# Patient Record
Sex: Male | Born: 1940 | Race: White | Hispanic: No | Marital: Single | Smoking: Never smoker
Health system: Southern US, Community
[De-identification: ages and names within clinical notes are randomized; demographics above are authoritative.]

## PROBLEM LIST (undated history)

## (undated) DIAGNOSIS — I219 Acute myocardial infarction, unspecified: Secondary | ICD-10-CM

## (undated) DIAGNOSIS — M109 Gout, unspecified: Secondary | ICD-10-CM

## (undated) DIAGNOSIS — K469 Unspecified abdominal hernia without obstruction or gangrene: Secondary | ICD-10-CM

## (undated) DIAGNOSIS — N183 Chronic kidney disease, stage 3 unspecified: Secondary | ICD-10-CM

## (undated) DIAGNOSIS — I5020 Unspecified systolic (congestive) heart failure: Secondary | ICD-10-CM

## (undated) DIAGNOSIS — I255 Ischemic cardiomyopathy: Secondary | ICD-10-CM

## (undated) DIAGNOSIS — I10 Essential (primary) hypertension: Secondary | ICD-10-CM

## (undated) DIAGNOSIS — N281 Cyst of kidney, acquired: Secondary | ICD-10-CM

## (undated) DIAGNOSIS — I480 Paroxysmal atrial fibrillation: Secondary | ICD-10-CM

## (undated) DIAGNOSIS — I2510 Atherosclerotic heart disease of native coronary artery without angina pectoris: Secondary | ICD-10-CM

## (undated) DIAGNOSIS — N400 Enlarged prostate without lower urinary tract symptoms: Secondary | ICD-10-CM

## (undated) DIAGNOSIS — D472 Monoclonal gammopathy: Secondary | ICD-10-CM

## (undated) DIAGNOSIS — E785 Hyperlipidemia, unspecified: Secondary | ICD-10-CM

## (undated) DIAGNOSIS — M1990 Unspecified osteoarthritis, unspecified site: Secondary | ICD-10-CM

## (undated) DIAGNOSIS — M1A00X1 Idiopathic chronic gout, unspecified site, with tophus (tophi): Secondary | ICD-10-CM

## (undated) HISTORY — DX: Gout, unspecified: M10.9

## (undated) HISTORY — PX: VEIN BYPASS SURGERY: SHX833

## (undated) HISTORY — PX: REPLACEMENT TOTAL KNEE: SUR1224

## (undated) HISTORY — DX: Ischemic cardiomyopathy: I25.5

## (undated) HISTORY — PX: CARDIAC CATHETERIZATION: SHX172

## (undated) HISTORY — DX: Unspecified systolic (congestive) heart failure: I50.20

## (undated) HISTORY — DX: Acute myocardial infarction, unspecified: I21.9

## (undated) HISTORY — PX: JOINT REPLACEMENT: SHX530

## (undated) HISTORY — PX: CORONARY ARTERY BYPASS GRAFT: SHX141

## (undated) HISTORY — DX: Paroxysmal atrial fibrillation: I48.0

## (undated) HISTORY — PX: OTHER SURGICAL HISTORY: SHX169

## (undated) HISTORY — DX: Essential (primary) hypertension: I10

## (undated) HISTORY — DX: Unspecified abdominal hernia without obstruction or gangrene: K46.9

## (undated) HISTORY — DX: Unspecified osteoarthritis, unspecified site: M19.90

## (undated) HISTORY — DX: Hyperlipidemia, unspecified: E78.5

---

## 2012-01-30 DIAGNOSIS — Z79899 Other long term (current) drug therapy: Secondary | ICD-10-CM | POA: Insufficient documentation

## 2012-01-30 DIAGNOSIS — M1990 Unspecified osteoarthritis, unspecified site: Secondary | ICD-10-CM | POA: Insufficient documentation

## 2012-01-30 DIAGNOSIS — M1A9XX1 Chronic gout, unspecified, with tophus (tophi): Secondary | ICD-10-CM | POA: Insufficient documentation

## 2012-10-23 DIAGNOSIS — N281 Cyst of kidney, acquired: Secondary | ICD-10-CM | POA: Insufficient documentation

## 2014-12-17 ENCOUNTER — Encounter: Payer: Self-pay | Admitting: Podiatry

## 2014-12-17 ENCOUNTER — Encounter: Payer: Self-pay | Admitting: *Deleted

## 2014-12-17 ENCOUNTER — Ambulatory Visit (INDEPENDENT_AMBULATORY_CARE_PROVIDER_SITE_OTHER): Payer: Medicare Other | Admitting: Podiatry

## 2014-12-17 ENCOUNTER — Other Ambulatory Visit: Payer: Self-pay | Admitting: *Deleted

## 2014-12-17 VITALS — BP 175/88 | HR 60 | Resp 16 | Ht 69.0 in | Wt 239.0 lb

## 2014-12-17 DIAGNOSIS — I2510 Atherosclerotic heart disease of native coronary artery without angina pectoris: Secondary | ICD-10-CM | POA: Insufficient documentation

## 2014-12-17 DIAGNOSIS — L600 Ingrowing nail: Secondary | ICD-10-CM

## 2014-12-17 DIAGNOSIS — N289 Disorder of kidney and ureter, unspecified: Secondary | ICD-10-CM | POA: Insufficient documentation

## 2014-12-17 DIAGNOSIS — N400 Enlarged prostate without lower urinary tract symptoms: Secondary | ICD-10-CM | POA: Insufficient documentation

## 2014-12-17 DIAGNOSIS — I10 Essential (primary) hypertension: Secondary | ICD-10-CM | POA: Insufficient documentation

## 2014-12-17 NOTE — Progress Notes (Signed)
   Subjective:    Patient ID: Jeff Henderson, male    DOB: December 17, 1940, 74 y.o.   MRN: [id number]  HPI Comments: Its the 2nd toenail on my left foot. Its growing over. It does not hurt. Ive had it 3 weeks. Its remained the same. i have pedicures.      Review of Systems  All other systems reviewed and are negative.      Objective:   Physical Exam        Assessment & Plan:

## 2014-12-17 NOTE — Patient Instructions (Signed)

## 2014-12-17 NOTE — Progress Notes (Signed)
Subjective:     Patient ID: Jeff Henderson, male   DOB: 07/18/1941, 74 y.o.   MRN: [id number]  HPI patient presents with a damaged nail second left that's thick dystrophic and impossible for him to cut. States that he's tried to work on it himself and has been to a pedicurist   Review of Systems  All other systems reviewed and are negative.      Objective:   Physical Exam  Constitutional: He is oriented to person, place, and time.  Cardiovascular: Intact distal pulses.   Musculoskeletal: Normal range of motion.  Neurological: He is oriented to person, place, and time.  Skin: Skin is warm.  Nursing note and vitals reviewed.  neurovascular status intact with muscle strength adequate and range of motion subtalar and midtarsal joint within normal limits. Patient is noted to have good digital perfusion is well oriented 3 and has a thick damaged second nail left that he cannot take care of or cut     Assessment:     Damaged second nail left that does have dystrophic changes within it and impossible for him to cut    Plan:     Reviewed condition and recommended removal of the nail. Explained procedure and risk and he wants the surgery and wants permanent removal. Infiltrated 60 mg Xylocaine Marcaine mixture remove the second nail exposed the base and applied chemical phenol for applications 30 seconds followed by alcohol lavaged and sterile dressing. Gave instructions on soaks and reappoint

## 2015-08-01 DIAGNOSIS — M5030 Other cervical disc degeneration, unspecified cervical region: Secondary | ICD-10-CM | POA: Insufficient documentation

## 2015-09-16 ENCOUNTER — Ambulatory Visit (INDEPENDENT_AMBULATORY_CARE_PROVIDER_SITE_OTHER): Payer: Medicare Other | Admitting: Sports Medicine

## 2015-09-16 ENCOUNTER — Ambulatory Visit (INDEPENDENT_AMBULATORY_CARE_PROVIDER_SITE_OTHER): Payer: Medicare Other

## 2015-09-16 ENCOUNTER — Encounter: Payer: Self-pay | Admitting: Sports Medicine

## 2015-09-16 VITALS — BP 161/78 | HR 62 | Resp 16

## 2015-09-16 DIAGNOSIS — L603 Nail dystrophy: Secondary | ICD-10-CM | POA: Diagnosis not present

## 2015-09-16 DIAGNOSIS — B351 Tinea unguium: Secondary | ICD-10-CM

## 2015-09-16 DIAGNOSIS — M79673 Pain in unspecified foot: Secondary | ICD-10-CM

## 2015-09-16 DIAGNOSIS — M10079 Idiopathic gout, unspecified ankle and foot: Secondary | ICD-10-CM

## 2015-09-16 DIAGNOSIS — M19079 Primary osteoarthritis, unspecified ankle and foot: Secondary | ICD-10-CM

## 2015-09-16 DIAGNOSIS — M109 Gout, unspecified: Secondary | ICD-10-CM

## 2015-09-16 MED ORDER — TRIAMCINOLONE ACETONIDE 10 MG/ML IJ SUSP: 10.0000 mg | Freq: Once | INTRAMUSCULAR | Status: DC

## 2015-09-16 MED ORDER — COLCHICINE 0.6 MG PO TABS: 0.6000 mg | ORAL_TABLET | Freq: Two times a day (BID) | ORAL | Status: DC

## 2015-09-16 NOTE — Progress Notes (Signed)
Patient ID: Jeff Henderson, male   DOB: [birth year], 74 y.o.   MRN: [id number] Subjective: Jeff Henderson is a 74 y.o. male patient who presents to office for evaluation of Left>Right foot pain. Patient complains of progressive pain especially over the last few day Left>right foot at the dorsum of the foot on left and at the 5th toe joint on the right. Pain in the right is worse with weightbearing and shoes that directly press on the area. Patient states that the left foot has flared up and became red and tender painful with pressure/shoes. Patient states that the right foot prevents him from exercising. Patient has not tried anything for the pain.  Admits to past gout hx of which he was put on experimental drug for and then stopped last year. Last attack was >1 year ago in her hands.   Patient is a retired Air[employer] pilot. Patient denies any other pedal complaints or related consitutional symptoms at this time.   Patient Active Problem List   Diagnosis Date Noted  . 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 Arteriosclerosis of coronary artery 12/17/2014  . BP (high blood pressure) 12/17/2014  . Impaired renal function 12/17/2014  . Kidney cysts 10/23/2012  . 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 Polypharmacy 01/30/2012  . Arthritis, degenerative 01/30/2012   Current Outpatient Prescriptions on File Prior to Visit  Medication Sig Dispense Refill  . amLODipine-benazepril (LOTREL) 10-20 MG per capsule   0  . aspirin 81 MG tablet Take 81 mg by mouth daily.    Marland Kitchen CIALIS 5 MG tablet   1  . colchicine 0.6 MG tablet Take 0.6 mg by mouth daily.  0  . Multiple Vitamin (MULTIVITAMIN) tablet Take 1 tablet by mouth daily.    . Omega-3 1000 MG CAPS Take by mouth.    . simvastatin (ZOCOR) 10 MG tablet Take 10 mg by mouth daily.     No current facility-administered medications on file prior to visit.   Allergies  Allergen Reactions  . Penicillins Rash     Objective:  General: Alert and oriented x3 in no acute  distress  Dermatology: No open lesions bilateral lower extremities, no webspace macerations, no ecchymosis bilateral, Significant redness, warmth, swelling left dorsal foot and right 5th MTPJ with mild tenderness. + dystrophy of Left 2nd toenail.   Vascular: Dorsalis Pedis and Posterior Tibial pedal pulses 1/4, Capillary Fill Time 3 seconds,(-) pedal hair growth bilateral, Temperature gradient increased.  Neurology: Johney Maine sensation intact via light touch bilateral.  Musculoskeletal: Tenderness with palpation at Left dorsal foot and right 5th MTPJ,No pain with calf compression bilateral. Left 2nd hammertoe deformity. Prominent metatarsal head #5 right greater than left with distal fat pad migration . There is decreased ankle, Subtalar joint, midtarsal joint, MTPJ range of motion secondary to significant arthritis..Strength within normal limits in all groups bilateral.   Gait: Antalgic wide base gait.   Xrays  Right Foot 3 views    Impression: Significant osteophytes and osteolysis at 1st, 5th MTPJ, midfoot, rearfoot and Ankle with old calcific avulsion fragments posterior calceanus. Soft tissues within normal limits       Left Foot 3 views    Impression: Significant osteophytes and osteolysis at 1st, 5th MTPJ, tarsometatarsal and midfoot, rearfoot, and ankle with old calcific avulsion fragments posterior calceanus, 2nd digit subluxed. Soft tissues within normal limits  Assessment and Plan: Problem List Items Addressed This Visit    None    Visit Diagnoses    Foot pain, unspecified laterality    -  Primary    Relevant Orders    DG Foot Complete Left    DG Foot Complete Right    Acute gout of foot, unspecified cause, unspecified laterality        Relevant Medications    cyclobenzaprine (FLEXERIL) 5 MG tablet    aspirin 81 MG tablet    triamcinolone acetonide (KENALOG) 10 MG/ML injection 10 mg    colchicine 0.6 MG tablet    Other Relevant Orders    CBC with Differential    Basic  Metabolic Panel    Sedimentation Rate    C-reactive protein    Uric Acid    ANA    Rheumatoid factor    Osteoarthritis of ankle and foot, unspecified laterality        Relevant Medications    cyclobenzaprine (FLEXERIL) 5 MG tablet    aspirin 81 MG tablet    triamcinolone acetonide (KENALOG) 10 MG/ML injection 10 mg    colchicine 0.6 MG tablet    Nail dystrophy        left 2nd toenail      -Complete examination performed -Xrays reviewed -Discussed treatement options for gout and education dispensed  -After verbal consent injected right 5th MTPJ with 1cc lidocaine, 1cc Marcaine, 0.25 [id number] without complication. Applied offloading pad to right 5th metatarsal head with instruction on use -Rx Colchicine 0.55m  -Ordered Uric Acid, Arthritic panel, CBC, BMP; will call patient if any labs are concerning -Debrided left 2nd toenail using sterile nail nipper and smooth with dremel without incident -Patient to return to office in 3 weeks or sooner if condition worsens.  TLandis Martins DPM

## 2015-09-16 NOTE — Patient Instructions (Signed)

## 2015-09-17 LAB — CBC WITH DIFFERENTIAL/PLATELET
BASOS: 0 %
Basophils Absolute: 0 10*3/uL (ref 0.0–0.2)
EOS (ABSOLUTE): 0.4 10*3/uL (ref 0.0–0.4)
EOS: 5 %
HEMATOCRIT: 40.9 % (ref 37.5–51.0)
Hemoglobin: 13.7 g/dL (ref 12.6–17.7)
IMMATURE GRANS (ABS): 0 10*3/uL (ref 0.0–0.1)
IMMATURE GRANULOCYTES: 0 %
LYMPHS: 20 %
Lymphocytes Absolute: 1.6 10*3/uL (ref 0.7–3.1)
MCH: 31.2 pg (ref 26.6–33.0)
MCHC: 33.5 g/dL (ref 31.5–35.7)
MCV: 93 fL (ref 79–97)
Monocytes Absolute: 0.8 10*3/uL (ref 0.1–0.9)
Monocytes: 10 %
NEUTROS ABS: 5.3 10*3/uL (ref 1.4–7.0)
Neutrophils: 65 %
PLATELETS: 188 10*3/uL (ref 150–379)
RBC: 4.39 x10E6/uL (ref 4.14–5.80)
RDW: 13.4 % (ref 12.3–15.4)
WBC: 8.1 10*3/uL (ref 3.4–10.8)

## 2015-09-17 LAB — BASIC METABOLIC PANEL
BUN / CREAT RATIO: 19 (ref 10–22)
BUN: 28 mg/dL — AB (ref 8–27)
CALCIUM: 9.2 mg/dL (ref 8.6–10.2)
CHLORIDE: 103 mmol/L (ref 97–108)
CO2: 24 mmol/L (ref 18–29)
CREATININE: 1.5 mg/dL — AB (ref 0.76–1.27)
GFR, EST AFRICAN AMERICAN: 52 mL/min/{1.73_m2} — AB (ref >59)
GFR, EST NON AFRICAN AMERICAN: 45 mL/min/{1.73_m2} — AB (ref >59)
Glucose: 83 mg/dL (ref 65–99)
Potassium: 4.7 mmol/L (ref 3.5–5.2)
Sodium: 143 mmol/L (ref 134–144)

## 2015-09-17 LAB — RHEUMATOID FACTOR: Rhuematoid fact SerPl-aCnc: <10 IU/mL (ref 0.0–13.9)

## 2015-09-17 LAB — URIC ACID: URIC ACID: 8.2 mg/dL (ref 3.7–8.6)

## 2015-09-17 LAB — ANA: Anti Nuclear Antibody(ANA): NEGATIVE

## 2015-09-17 LAB — SEDIMENTATION RATE: SED RATE: 4 mm/h (ref 0–30)

## 2015-09-17 LAB — C-REACTIVE PROTEIN: CRP: 11.1 mg/L — AB (ref 0.0–4.9)

## 2015-09-20 ENCOUNTER — Telehealth: Payer: Self-pay | Admitting: Sports Medicine

## 2015-09-20 NOTE — Telephone Encounter (Signed)
Called patient and discussed lab results; Patient symptomatically is doing better; reports swelling and redness improving; Will f/u 11/4; Plan to work with PCP for Rheum Consult for long term gout management.

## 2015-10-07 ENCOUNTER — Ambulatory Visit: Payer: Medicare Other | Admitting: Sports Medicine

## 2015-10-14 ENCOUNTER — Ambulatory Visit (INDEPENDENT_AMBULATORY_CARE_PROVIDER_SITE_OTHER): Payer: Medicare Other | Admitting: Sports Medicine

## 2015-10-14 ENCOUNTER — Encounter: Payer: Self-pay | Admitting: Sports Medicine

## 2015-10-14 DIAGNOSIS — M79673 Pain in unspecified foot: Secondary | ICD-10-CM | POA: Diagnosis not present

## 2015-10-14 DIAGNOSIS — M109 Gout, unspecified: Secondary | ICD-10-CM

## 2015-10-14 DIAGNOSIS — M19079 Primary osteoarthritis, unspecified ankle and foot: Secondary | ICD-10-CM

## 2015-10-14 DIAGNOSIS — M10079 Idiopathic gout, unspecified ankle and foot: Secondary | ICD-10-CM | POA: Diagnosis not present

## 2015-10-14 NOTE — Progress Notes (Signed)
Patient ID: Jeff Henderson, male   DOB: December 10, 1940, 74 y.o.   MRN: [id number]  Subjective: Jeff Henderson is a 74 y.o. male patient who returns to office for evaluation of Left>Right foot pain secondary to acute gouty attack; reports that he took the colchicine with resolution of symptoms and is back at gym now with no pain in his feet. Patient denies any other pedal complaints or related consitutional symptoms at this time.   Patient Active Problem List   Diagnosis Date Noted  . 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 Arteriosclerosis of coronary artery 12/17/2014  . BP (high blood pressure) 12/17/2014  . Impaired renal function 12/17/2014  . Kidney cysts 10/23/2012  . 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 Polypharmacy 01/30/2012  . Arthritis, degenerative 01/30/2012   Current Outpatient Prescriptions on File Prior to Visit  Medication Sig Dispense Refill  . amLODipine-benazepril (LOTREL) 10-20 MG per capsule   0  . aspirin 81 MG tablet     . atorvastatin (LIPITOR) 10 MG tablet   0  . CIALIS 5 MG tablet   1  . colchicine 0.6 MG tablet Take 1 tablet (0.6 mg total) by mouth 2 (two) times daily. 60 tablet 1  . cyclobenzaprine (FLEXERIL) 5 MG tablet Take 5 mg by mouth 3 (three) times daily.  0  . FLUZONE HIGH-DOSE 0.5 ML SUSY inject 0.5 milliliter intramuscularly  0  . Multiple Vitamin (MULTIVITAMIN) tablet Take 1 tablet by mouth daily.    . Multiple Vitamins-Minerals (MULTIVITAMIN ADULTS 50+ PO) Take by mouth.    . Omega-3 1000 MG CAPS Take by mouth.    . simvastatin (ZOCOR) 10 MG tablet Take 10 mg by mouth daily.    . tamsulosin (FLOMAX) 0.4 MG CAPS capsule   1  . VOLTAREN 1 % GEL apply 2 grams to affected area four times a day  0   Current Facility-Administered Medications on File Prior to Visit  Medication Dose Route Frequency Provider Last Rate Last Dose  . triamcinolone acetonide (KENALOG) 10 MG/ML injection 10 mg  10 mg Other Once Landis Martins, DPM       Allergies  Allergen Reactions  .  Penicillins Rash    Objective:  General: Alert and oriented x3 in no acute distress  Dermatology: No open lesions bilateral lower extremities, no webspace macerations, no ecchymosis bilateral, No warmth, swelling or tenderness bilateral. + dystrophy of Left 2nd toenail, asymptomatic.   Vascular: Dorsalis Pedis and Posterior Tibial pedal pulses 1/4, Capillary Fill Time 3 seconds,(-) pedal hair growth bilateral, Temperature gradient increased.  Neurology: Johney Maine sensation intact via light touch bilateral.  Musculoskeletal: No tenderness with palpation at Left dorsal foot and right 5th MTPJ,No pain with calf compression bilateral. Left 2nd hammertoe deformity asymptomatic. Prominent metatarsal head #5 right greater than left with distal fat pad migration . There is decreased ankle, Subtalar joint, midtarsal joint, MTPJ range of motion secondary to significant arthritis.Strength within normal limits in all groups bilateral.    Assessment and Plan: Problem List Items Addressed This Visit    None    Visit Diagnoses    Foot pain, unspecified laterality    -  Primary    resolved    Acute gout of foot, unspecified cause, unspecified laterality        resolved    Osteoarthritis of ankle and foot, unspecified laterality          -Complete examination performed -Discussed mantainance treatment for gout and previous labs as discuss during phone call to  Mr. Duque a few weeks ago. -Recommended patient to follow up with PCP or rheumatologist for long term management.  -Patient to return to office as needed or sooner if condition worsens.  Landis Martins, DPM

## 2015-12-20 DIAGNOSIS — K429 Umbilical hernia without obstruction or gangrene: Secondary | ICD-10-CM | POA: Insufficient documentation

## 2016-02-23 ENCOUNTER — Other Ambulatory Visit: Payer: Self-pay | Admitting: Sports Medicine

## 2016-09-04 ENCOUNTER — Other Ambulatory Visit: Payer: Self-pay | Admitting: Otolaryngology

## 2016-09-04 DIAGNOSIS — H9042 Sensorineural hearing loss, unilateral, left ear, with unrestricted hearing on the contralateral side: Secondary | ICD-10-CM

## 2016-09-18 ENCOUNTER — Ambulatory Visit
Admission: RE | Admit: 2016-09-18 | Discharge: 2016-09-18 | Disposition: A | Discharge status: Home / Self Care | Payer: Medicare Other | Source: Ambulatory Visit | Attending: Otolaryngology | Admitting: Otolaryngology

## 2016-09-18 ENCOUNTER — Other Ambulatory Visit
Admission: RE | Admit: 2016-09-18 | Discharge: 2016-09-18 | Disposition: A | Discharge status: Home / Self Care | Payer: Medicare Other | Source: Ambulatory Visit | Attending: Otolaryngology | Admitting: Otolaryngology

## 2016-09-18 DIAGNOSIS — Z8673 Personal history of transient ischemic attack (TIA), and cerebral infarction without residual deficits: Secondary | ICD-10-CM | POA: Insufficient documentation

## 2016-09-18 DIAGNOSIS — Z01812 Encounter for preprocedural laboratory examination: Secondary | ICD-10-CM | POA: Insufficient documentation

## 2016-09-18 DIAGNOSIS — H9042 Sensorineural hearing loss, unilateral, left ear, with unrestricted hearing on the contralateral side: Secondary | ICD-10-CM

## 2016-09-18 LAB — CREATININE, SERUM
CREATININE: 1.75 mg/dL — AB (ref 0.61–1.24)
GFR calc non Af Amer: 36 mL/min — ABNORMAL LOW (ref >60)
GFR, EST AFRICAN AMERICAN: 42 mL/min — AB (ref >60)

## 2016-09-18 MED ORDER — GADOBENATE DIMEGLUMINE 529 MG/ML IV SOLN
10.0000 mL | Freq: Once | INTRAVENOUS | Status: AC | PRN
  Administered 2016-09-18: 10 mL via INTRAVENOUS

## 2017-09-12 ENCOUNTER — Other Ambulatory Visit: Payer: Self-pay | Admitting: Family Medicine

## 2017-09-12 DIAGNOSIS — N183 Chronic kidney disease, stage 3 unspecified: Secondary | ICD-10-CM

## 2017-09-12 DIAGNOSIS — R7989 Other specified abnormal findings of blood chemistry: Secondary | ICD-10-CM

## 2017-09-16 ENCOUNTER — Ambulatory Visit
Admission: RE | Admit: 2017-09-16 | Discharge: 2017-09-16 | Disposition: A | Discharge status: Home / Self Care | Payer: Medicare Other | Source: Ambulatory Visit | Attending: Family Medicine | Admitting: Family Medicine

## 2017-09-16 DIAGNOSIS — N183 Chronic kidney disease, stage 3 unspecified: Secondary | ICD-10-CM

## 2017-09-16 DIAGNOSIS — R7989 Other specified abnormal findings of blood chemistry: Secondary | ICD-10-CM | POA: Insufficient documentation

## 2017-09-30 ENCOUNTER — Encounter: Payer: Self-pay | Admitting: Oncology

## 2017-09-30 ENCOUNTER — Inpatient Hospital Stay: Payer: Medicare Other | Attending: Oncology | Admitting: Oncology

## 2017-09-30 ENCOUNTER — Inpatient Hospital Stay: Payer: Medicare Other

## 2017-09-30 DIAGNOSIS — I129 Hypertensive chronic kidney disease with stage 1 through stage 4 chronic kidney disease, or unspecified chronic kidney disease: Secondary | ICD-10-CM | POA: Insufficient documentation

## 2017-09-30 DIAGNOSIS — R768 Other specified abnormal immunological findings in serum: Secondary | ICD-10-CM

## 2017-09-30 DIAGNOSIS — Z7982 Long term (current) use of aspirin: Secondary | ICD-10-CM | POA: Diagnosis not present

## 2017-09-30 DIAGNOSIS — M1990 Unspecified osteoarthritis, unspecified site: Secondary | ICD-10-CM | POA: Insufficient documentation

## 2017-09-30 DIAGNOSIS — N281 Cyst of kidney, acquired: Secondary | ICD-10-CM | POA: Insufficient documentation

## 2017-09-30 DIAGNOSIS — N183 Chronic kidney disease, stage 3 (moderate): Secondary | ICD-10-CM

## 2017-09-30 DIAGNOSIS — Z79899 Other long term (current) drug therapy: Secondary | ICD-10-CM | POA: Diagnosis not present

## 2017-09-30 DIAGNOSIS — I2510 Atherosclerotic heart disease of native coronary artery without angina pectoris: Secondary | ICD-10-CM | POA: Insufficient documentation

## 2017-09-30 DIAGNOSIS — M109 Gout, unspecified: Secondary | ICD-10-CM | POA: Insufficient documentation

## 2017-09-30 DIAGNOSIS — N400 Enlarged prostate without lower urinary tract symptoms: Secondary | ICD-10-CM | POA: Insufficient documentation

## 2017-09-30 LAB — COMPREHENSIVE METABOLIC PANEL
ALK PHOS: 50 U/L (ref 38–126)
ALT: 27 U/L (ref 17–63)
ANION GAP: 9 (ref 5–15)
AST: 27 U/L (ref 15–41)
Albumin: 4.1 g/dL (ref 3.5–5.0)
BILIRUBIN TOTAL: 0.8 mg/dL (ref 0.3–1.2)
BUN: 37 mg/dL — ABNORMAL HIGH (ref 6–20)
CALCIUM: 9.2 mg/dL (ref 8.9–10.3)
CO2: 24 mmol/L (ref 22–32)
CREATININE: 1.69 mg/dL — AB (ref 0.61–1.24)
Chloride: 105 mmol/L (ref 101–111)
GFR, EST AFRICAN AMERICAN: 43 mL/min — AB (ref >60)
GFR, EST NON AFRICAN AMERICAN: 37 mL/min — AB (ref >60)
Glucose, Bld: 93 mg/dL (ref 65–99)
Potassium: 4.1 mmol/L (ref 3.5–5.1)
SODIUM: 138 mmol/L (ref 135–145)
TOTAL PROTEIN: 7.5 g/dL (ref 6.5–8.1)

## 2017-09-30 LAB — CBC WITH DIFFERENTIAL/PLATELET
Basophils Absolute: 0.1 10*3/uL (ref 0–0.1)
Basophils Relative: 1 %
EOS ABS: 0.3 10*3/uL (ref 0–0.7)
Eosinophils Relative: 4 %
HCT: 41.5 % (ref 40.0–52.0)
HEMOGLOBIN: 13.9 g/dL (ref 13.0–18.0)
LYMPHS ABS: 1 10*3/uL (ref 1.0–3.6)
LYMPHS PCT: 14 %
MCH: 31 pg (ref 26.0–34.0)
MCHC: 33.6 g/dL (ref 32.0–36.0)
MCV: 92.3 fL (ref 80.0–100.0)
MONOS PCT: 7 %
Monocytes Absolute: 0.5 10*3/uL (ref 0.2–1.0)
NEUTROS PCT: 74 %
Neutro Abs: 5.4 10*3/uL (ref 1.4–6.5)
Platelets: 193 10*3/uL (ref 150–440)
RBC: 4.5 MIL/uL (ref 4.40–5.90)
RDW: 13.4 % (ref 11.5–14.5)
WBC: 7.3 10*3/uL (ref 3.8–10.6)

## 2017-09-30 NOTE — Progress Notes (Signed)
Hematology/Oncology Consult note [facility] [phone] [phone]  Patient Care Team: System, Provider Not In as PCP - General   Name of the patient: Jeff Henderson  [id number]  July 26, 1941    Reason for referral- elevated immunoglobulin   Referring physician- Dr. Mcneil Sober   Date of visit: 09/30/17   History of presenting illness- Patient is a 76 year old Caucasian male with past medical occlusion of the artery disease, BPH stage III chronic kidney disease other medical problems.  He was evaluated by Dr.Tuchman from [facility] for elevated kappa light chain.  At that time it was thought that the elevated kappa light chain possibly as a result of chronic kidney disease rather than the cause.  Last visit with Dr. Amalia Hailey was in July 2014.  Patient also had a fat pad biopsy in 2014 did not reveal any evidence of malignancy or amyloidosis.  Patient had a bone marrow biopsy which revealed a normocellular bone 40% with trilineage hematopoiesis.  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  Skeletal survey back then did not reveal any lytic lesions.  Patient was also seen by Dr. Dewaine Oats from nephrology because of chronic kidney disease was thought to be due to NSAIDS.  Patient's most recent blood work was as follows: Serum Light Chains Were Elevated at 3.89 with a Free Light Chain Ratio Was Normal at 1.61.  Serum immunofixation revealed no monoclonal protein.  SPEP revealed asymmetric peak in the gamma region.  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  Monoclonal protein was not detected.  Recent CBC from 09/03/2017 revealed a white count of 7.5, H&H of 14/42.5 and a platelet count of 202.  BUN/creatinine was 30/1.8 has remained stable  Patient served for 4 years in the [employer] and continues to be fit and active.  Denies any pain today. No unintentional weight loss   ECOG PS- 0  Pain scale- 0   Review of systems- Review of Systems  Constitutional: Negative for chills, fever, malaise/fatigue and weight loss.  HENT: Negative for congestion, ear discharge and nosebleeds.   Eyes: Negative for blurred vision.  Respiratory: Negative for cough, hemoptysis, sputum production, shortness of breath and wheezing.   Cardiovascular: Negative for chest pain, palpitations, orthopnea and claudication.  Gastrointestinal: Negative for abdominal pain, blood in stool, constipation, diarrhea, heartburn, melena, nausea and vomiting.  Genitourinary: Negative for dysuria, flank pain, frequency, hematuria and urgency.  Musculoskeletal: Negative for back pain, joint pain and myalgias.  Skin: Negative for rash.  Neurological: Negative for dizziness, tingling, focal weakness, seizures, weakness and headaches.  Endo/Heme/Allergies: Does not bruise/bleed easily.  Psychiatric/Behavioral: Negative for depression and suicidal ideas. The patient does not have insomnia.     Allergies  Allergen Reactions  . Penicillins Rash    Patient Active Problem List   Diagnosis Date Noted  . 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 Arteriosclerosis of coronary artery 12/17/2014  . BP (high blood pressure) 12/17/2014  . Impaired renal function 12/17/2014  . Kidney cysts 10/23/2012  . 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 Polypharmacy 01/30/2012  . Arthritis, degenerative 01/30/2012     No past medical history on file.   No past surgical history on file.  Social History   Social History  . Marital status: Unknown    Spouse name: N/A  . Number of children: N/A  . Years of education: N/A   Occupational History  .  Not on file.   Social History Main Topics  . Smoking status: Never Smoker  . Smokeless tobacco: Not on file  . Alcohol use No  . Drug use: Unknown  . Sexual activity: Not on file   Other Topics Concern  . Not on file     Social History Narrative  . No narrative on file     No family history on file.   Current Outpatient Prescriptions:  .  amLODipine-benazepril (LOTREL) 10-20 MG per capsule, , Disp: , Rfl: 0 .  aspirin 81 MG tablet, , Disp: , Rfl:  .  atorvastatin (LIPITOR) 10 MG tablet, , Disp: , Rfl: 0 .  CIALIS 5 MG tablet, , Disp: , Rfl: 1 .  colchicine 0.6 MG tablet, Take 1 tablet (0.6 mg total) by mouth 2 (two) times daily., Disp: 60 tablet, Rfl: 1 .  cyclobenzaprine (FLEXERIL) 5 MG tablet, Take 5 mg by mouth 3 (three) times daily., Disp: , Rfl: 0 .  FLUZONE HIGH-DOSE 0.5 ML SUSY, inject 0.5 milliliter intramuscularly, Disp: , Rfl: 0 .  Multiple Vitamin (MULTIVITAMIN) tablet, Take 1 tablet by mouth daily., Disp: , Rfl:  .  Multiple Vitamins-Minerals (MULTIVITAMIN ADULTS 50+ PO), Take by mouth., Disp: , Rfl:  .  Omega-3 1000 MG CAPS, Take by mouth., Disp: , Rfl:  .  simvastatin (ZOCOR) 10 MG tablet, Take 10 mg by mouth daily., Disp: , Rfl:  .  tamsulosin (FLOMAX) 0.4 MG CAPS capsule, , Disp: , Rfl: 1 .  VOLTAREN 1 % GEL, apply 2 grams to affected area four times a day, Disp: , Rfl: 0  Current Facility-Administered Medications:  .  triamcinolone acetonide (KENALOG) 10 MG/ML injection 10 mg, 10 mg, Other, Once, Landis Martins, DPM   Physical exam:  Vitals:   09/30/17 0951 09/30/17 1017  BP: (!) 149/70   Pulse: 66   Resp:  16  Temp: (!) 97.5 F (36.4 C)   TempSrc: Tympanic   Weight: 250 lb 14.1 oz (113.8 kg)    Physical Exam  Constitutional: He is oriented to person, place, and time.  Obese. Appears in no acute distress  HENT:  Head: Normocephalic and atraumatic.  Eyes: Pupils are equal, round, and reactive to light. EOM are normal.  Neck: Normal range of motion.  Cardiovascular: Normal rate, regular rhythm and normal heart sounds.   Pulmonary/Chest: Effort normal and breath sounds normal.  Abdominal: Soft. Bowel sounds are normal.  Neurological: He is alert and oriented to  person, place, and time.  Skin: Skin is warm and dry.       CMP Latest Ref Rng & Units 09/18/2016  Glucose 65 - 99 mg/dL -  BUN 8 - 27 mg/dL -  Creatinine 0.61 - 1.24 mg/dL 1.75(H)  Sodium 134 - 144 mmol/L -  Potassium 3.5 - 5.2 mmol/L -  Chloride 97 - 108 mmol/L -  CO2 18 - 29 mmol/L -  Calcium 8.6 - 10.2 mg/dL -   CBC Latest Ref Rng & Units 09/16/2015  WBC 3.4 - 10.8 x10E3/uL 8.1  Hemoglobin 12.6 - 17.7 g/dL 13.7  Hematocrit 37.5 - 51.0 % 40.9  Platelets 150 - 379 x10E3/uL 188    No images are attached to the encounter.  US Renal  Result Date: 09/16/2017 CLINICAL DATA:  Elevated serum creatinine, chronic kidney disease. EXAM: RENAL / URINARY TRACT ULTRASOUND COMPLETE COMPARISON:  None. FINDINGS: Right Kidney: Length: 1.3 cm. Parenchymal echogenicity is slightly increased. There are multiple anechoic or nearly anechoic lesions with increased  through transmission, some of which contain a septation and/or calcification, measuring up to 2.4 x 2.4 x 1.5 cm. A calcification is seen in the medullary region. No hydronephrosis. Left Kidney: Length: 11.5 cm. Parenchymal echogenicity is slightly increased. Anechoic or nearly anechoic lesions with increased through transmission measure up to 2.3 x 1.7 x 1.6 cm. The largest contains an internal septation. Calcifications are seen in the renal parenchyma/medullary regions. Bladder: Appears normal for degree of bladder distention. IMPRESSION: 1. Slightly increased parenchymal echogenicity is in keeping with chronic medical renal disease. 2. Scattered cysts, simple to mildly complex in nature. Electronically Signed   By: Lorin Picket M.D.   On: 09/16/2017 10:12    Assessment and plan- Patient is a 76 y.o. male referred for h/o elevated kappa light chains in serum in the past and h/o CKD  I have reviewed prior work up for MGUS including fat pad biopsy and bone marrow biopsy that pateint had in 2014. There was no evidence of monclonal protein on  IFE in serum and urine IFE. Over the years patient has had a grdaually increasing level of kappa light chains as well as lambda light chains with a preserved kappa lambda light chain ratio. I suspect this is due to his CKD and not because of hematological disorder like MGUS/ multiple myeloma or amyloidosis  Today I will obtain cbc with diff, cmp, myeloma panel, serum free light chains and 24 hour urine protein electrophoresis and immunofixation and 24 hour urine protein. I will see him back in 2 weeks time to discuss his results.   Thank you for this kind referral and the opportunity to participate in the care of this patient   Visit Diagnosis 1. Elevated serum immunoglobulin free light chain level     Dr. Randa Evens, MD, MPH [place] [facility] Pager- [phone] 09/30/2017  11:11 AM

## 2017-09-30 NOTE — Progress Notes (Signed)
New consult for abnormal protein in the blood. Patient states that he feels well today and denies having any pain. He has already received his Flu Vaccine.

## 2017-10-01 LAB — MULTIPLE MYELOMA PANEL, SERUM
ALBUMIN SERPL ELPH-MCNC: 3.8 g/dL (ref 2.9–4.4)
ALPHA 1: 0.2 g/dL (ref 0.0–0.4)
Albumin/Glob SerPl: 1.2 (ref 0.7–1.7)
Alpha2 Glob SerPl Elph-Mcnc: 1 g/dL (ref 0.4–1.0)
B-Globulin SerPl Elph-Mcnc: 1.2 g/dL (ref 0.7–1.3)
GAMMA GLOB SERPL ELPH-MCNC: 1.1 g/dL (ref 0.4–1.8)
Globulin, Total: 3.4 g/dL (ref 2.2–3.9)
IGM (IMMUNOGLOBULIN M), SRM: 132 mg/dL (ref 15–143)
IgA: 337 mg/dL (ref 61–437)
IgG (Immunoglobin G), Serum: 945 mg/dL (ref 700–1600)
Total Protein ELP: 7.2 g/dL (ref 6.0–8.5)

## 2017-10-01 LAB — KAPPA/LAMBDA LIGHT CHAINS
KAPPA FREE LGHT CHN: 41.7 mg/L — AB (ref 3.3–19.4)
KAPPA, LAMDA LIGHT CHAIN RATIO: 1.45 (ref 0.26–1.65)
Lambda free light chains: 28.8 mg/L — ABNORMAL HIGH (ref 5.7–26.3)

## 2017-10-11 DIAGNOSIS — E782 Mixed hyperlipidemia: Secondary | ICD-10-CM | POA: Insufficient documentation

## 2017-10-14 ENCOUNTER — Encounter: Payer: Self-pay | Admitting: Oncology

## 2017-10-14 ENCOUNTER — Inpatient Hospital Stay: Payer: Medicare Other

## 2017-10-14 ENCOUNTER — Inpatient Hospital Stay: Payer: Medicare Other | Attending: Oncology | Admitting: Oncology

## 2017-10-14 VITALS — BP 151/80 | HR 62 | Temp 97.4°F | Resp 18 | Ht 69.0 in | Wt 253.1 lb

## 2017-10-14 DIAGNOSIS — Z880 Allergy status to penicillin: Secondary | ICD-10-CM | POA: Diagnosis not present

## 2017-10-14 DIAGNOSIS — Z7982 Long term (current) use of aspirin: Secondary | ICD-10-CM | POA: Insufficient documentation

## 2017-10-14 DIAGNOSIS — M1990 Unspecified osteoarthritis, unspecified site: Secondary | ICD-10-CM | POA: Insufficient documentation

## 2017-10-14 DIAGNOSIS — M109 Gout, unspecified: Secondary | ICD-10-CM | POA: Insufficient documentation

## 2017-10-14 DIAGNOSIS — R768 Other specified abnormal immunological findings in serum: Secondary | ICD-10-CM

## 2017-10-14 DIAGNOSIS — Z79899 Other long term (current) drug therapy: Secondary | ICD-10-CM | POA: Insufficient documentation

## 2017-10-14 DIAGNOSIS — I2510 Atherosclerotic heart disease of native coronary artery without angina pectoris: Secondary | ICD-10-CM | POA: Diagnosis not present

## 2017-10-14 DIAGNOSIS — R809 Proteinuria, unspecified: Secondary | ICD-10-CM | POA: Insufficient documentation

## 2017-10-14 DIAGNOSIS — I129 Hypertensive chronic kidney disease with stage 1 through stage 4 chronic kidney disease, or unspecified chronic kidney disease: Secondary | ICD-10-CM | POA: Diagnosis not present

## 2017-10-14 DIAGNOSIS — N183 Chronic kidney disease, stage 3 (moderate): Secondary | ICD-10-CM | POA: Insufficient documentation

## 2017-10-14 DIAGNOSIS — N401 Enlarged prostate with lower urinary tract symptoms: Secondary | ICD-10-CM | POA: Diagnosis not present

## 2017-10-14 NOTE — Progress Notes (Signed)
Hematology/Oncology Consult note [facility]  Telephone:([phone] [phone]  Patient Care Team: [name], MD as PCP - General (Family Medicine)   Name of the patient: Jeff Henderson  [id number]  29-Apr-1941   Date of visit: 10/14/17  Diagnosis- elevated kappa light chains likely due to CKD  Chief complaint/ Reason for visit- discuss results of blood work  Heme/Onc history: Patient is a 76 year old Caucasian male with past medical occlusion of the artery disease, BPH stage III chronic kidney disease other medical problems.  He was evaluated by Dr.Tuchman from [facility] for elevated kappa light chain.  At that time it was thought that the elevated kappa light chain possibly as a result of chronic kidney disease rather than the cause.  Last visit with Dr. Amalia Hailey was in July 2014.  Patient also had a fat pad biopsy in 2014 did not reveal any evidence of malignancy or amyloidosis.  Patient had a bone marrow biopsy which revealed a normocellular bone 40% with trilineage hematopoiesis.  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  Skeletal survey back then did not reveal any lytic lesions.  Patient was also seen by Dr. Dewaine Oats from nephrology because of chronic kidney disease was thought to be due to NSAIDS.  Patient's most recent blood work was as follows: Serum Light Chains Were Elevated at 3.89 with a Free Light Chain Ratio Was Normal at 1.61.  Serum immunofixation revealed no monoclonal protein.  SPEP revealed asymmetric peak in the gamma region.  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  Monoclonal protein was not detected.  Recent CBC from 09/03/2017 revealed a white count of 7.5, H&H of 14/42.5 and a platelet count of 202.  BUN/creatinine was 30/1.8 has remained stable  Patient served for 4 years in the  [employer] and continues to be fit and active. Denies any pain today. No unintentional weight loss  Results of blood work from 09/30/2017 were as follows: CBC showed white count of 7.3, H&H of 13.9/41.5 with a platelet count of 193.  CMP was significant for elevated BUN and creatinine of 37/1.69 respectively which is chronic and stable.  Multiple myeloma panel did not reveal any monoclonal protein on SPEP or immunofixation.  Serum free light chains revealed elevated Of 41.7 and elevated lambda of 28.8 with a normal ratio of 1.45    Interval history- doing well. Denies nay complaints  ECOG PS- 0 Pain scale- 0   Review of systems- Review of Systems  Constitutional: Negative for chills, fever, malaise/fatigue and weight loss.  HENT: Negative for congestion, ear discharge and nosebleeds.   Eyes: Negative for blurred vision.  Respiratory: Negative for cough, hemoptysis, sputum production, shortness of breath and wheezing.   Cardiovascular: Negative for chest pain, palpitations, orthopnea and claudication.  Gastrointestinal: Negative for abdominal pain, blood in stool, constipation, diarrhea, heartburn, melena, nausea and vomiting.  Genitourinary: Negative for dysuria, flank pain, frequency, hematuria and urgency.  Musculoskeletal: Negative for back pain, joint pain and myalgias.  Skin: Negative for rash.  Neurological: Negative for dizziness, tingling, focal weakness, seizures, weakness and headaches.  Endo/Heme/Allergies: Does not bruise/bleed easily.  Psychiatric/Behavioral: Negative for depression and suicidal ideas. The patient does not have insomnia.       Allergies  Allergen Reactions  . Penicillins Rash     Past Medical History:  Diagnosis Date  . Arthritis   . Gout   .  Hernia, abdominal   . Hypertension      Past Surgical History:  Procedure Laterality Date  . carpel tunnel    . REPLACEMENT TOTAL KNEE    . VEIN BYPASS SURGERY      Social History   Socioeconomic  History  . Marital status: Unknown    Spouse name: Not on file  . Number of children: Not on file  . Years of education: Not on file  . Highest education level: Not on file  Social Needs  . Financial resource strain: Not on file  . Food insecurity - worry: Not on file  . Food insecurity - inability: Not on file  . Transportation needs - medical: Not on file  . Transportation needs - non-medical: Not on file  Occupational History  . Not on file  Tobacco Use  . Smoking status: Never Smoker  . Smokeless tobacco: Never Used  Substance and Sexual Activity  . Alcohol use: No    Alcohol/week: 0.0 oz  . Drug use: No  . Sexual activity: Yes  Other Topics Concern  . Not on file  Social History Narrative  . Not on file    History reviewed. No pertinent family history.   Current Outpatient Medications:  .  amLODipine-benazepril (LOTREL) 10-20 MG per capsule, Take 1 capsule daily by mouth. , Disp: , Rfl: 0 .  aspirin 81 MG tablet, Take 81 mg daily by mouth. , Disp: , Rfl:  .  CIALIS 5 MG tablet, Take 5 mg daily as needed by mouth. , Disp: , Rfl: 1 .  Multiple Vitamins-Minerals (MULTIVITAMIN ADULTS 50+ PO), Take 1 tablet daily by mouth. , Disp: , Rfl:  .  Omega-3 1000 MG CAPS, Take 1 capsule daily by mouth. , Disp: , Rfl:   Current Facility-Administered Medications:  .  triamcinolone acetonide (KENALOG) 10 MG/ML injection 10 mg, 10 mg, Other, Once, Landis Martins, DPM  Physical exam:  Vitals:   10/14/17 1016  BP: (!) 151/80  Pulse: 62  Resp: 18  Temp: (!) 97.4 F (36.3 C)  TempSrc: Tympanic  Weight: 253 lb 1.4 oz (114.8 kg)  Height: 5' 9" (1.753 m)   Physical Exam  Constitutional: He is oriented to person, place, and time and well-developed, well-nourished, and in no distress.  HENT:  Head: Normocephalic and atraumatic.  Eyes: EOM are normal. Pupils are equal, round, and reactive to light.  Neck: Normal range of motion.  Cardiovascular: Normal rate, regular rhythm and  normal heart sounds.  Pulmonary/Chest: Effort normal and breath sounds normal.  Abdominal: Soft. Bowel sounds are normal.  Neurological: He is alert and oriented to person, place, and time.  Skin: Skin is warm and dry.     CMP Latest Ref Rng & Units 09/30/2017  Glucose 65 - 99 mg/dL 93  BUN 6 - 20 mg/dL 37(H)  Creatinine 0.61 - 1.24 mg/dL 1.69(H)  Sodium 135 - 145 mmol/L 138  Potassium 3.5 - 5.1 mmol/L 4.1  Chloride 101 - 111 mmol/L 105  CO2 22 - 32 mmol/L 24  Calcium 8.9 - 10.3 mg/dL 9.2  Total Protein 6.5 - 8.1 g/dL 7.5  Total Bilirubin 0.3 - 1.2 mg/dL 0.8  Alkaline Phos 38 - 126 U/L 50  AST 15 - 41 U/L 27  ALT 17 - 63 U/L 27   CBC Latest Ref Rng & Units 09/30/2017  WBC 3.8 - 10.6 K/uL 7.3  Hemoglobin 13.0 - 18.0 g/dL 13.9  Hematocrit 40.0 - 52.0 % 41.5  Platelets 150 -  440 K/uL 193    No images are attached to the encounter.  US Renal  Result Date: 09/16/2017 CLINICAL DATA:  Elevated serum creatinine, chronic kidney disease. EXAM: RENAL / URINARY TRACT ULTRASOUND COMPLETE COMPARISON:  None. FINDINGS: Right Kidney: Length: 1.3 cm. Parenchymal echogenicity is slightly increased. There are multiple anechoic or nearly anechoic lesions with increased through transmission, some of which contain a septation and/or calcification, measuring up to 2.4 x 2.4 x 1.5 cm. A calcification is seen in the medullary region. No hydronephrosis. Left Kidney: Length: 11.5 cm. Parenchymal echogenicity is slightly increased. Anechoic or nearly anechoic lesions with increased through transmission measure up to 2.3 x 1.7 x 1.6 cm. The largest contains an internal septation. Calcifications are seen in the renal parenchyma/medullary regions. Bladder: Appears normal for degree of bladder distention. IMPRESSION: 1. Slightly increased parenchymal echogenicity is in keeping with chronic medical renal disease. 2. Scattered cysts, simple to mildly complex in nature. Electronically Signed   By: Lorin Picket M.D.    On: 09/16/2017 10:12     Assessment and plan- Patient is a 76 y.o. male referred for elevated kappa and lambda light chains  I discussed the results of the blood work with the patient in detail.  Patient does not have any evidence of anemia hypercalcemia.  He does have chronic kidney disease secondary to hypertension which is stable.  Multiple myeloma panel did not reveal any monoclonal protein on SPEP and immunofixation.  Elevated kappa and lambda free light chains are likely due to his chronic kidney disease as light chain excretion is impaired in chronic kidney disease.  I do not feel that these light chains are the cause of his CKD.  Also the free light chain ratio is normal.  Based on these findings patient does not have any evidence of MGUS or overt multiple myeloma at this time.  This can be monitored conservatively without any further intervention.  I will see the patient back in 6 months time with CBC, CMP, serum free light chains and multiple myeloma panel done 1 week prior   Visit Diagnosis 1. Elevated serum immunoglobulin free light chains      Dr. Randa Evens, MD, MPH [facility] at [facility] Pager- [phone] 10/14/2017 10:18 AM

## 2017-10-14 NOTE — Progress Notes (Signed)
Here to get lab results

## 2017-10-16 LAB — IFE+PROTEIN ELECTRO, 24-HR UR
% BETA, URINE: 8.5 %
ALPHA 1 URINE: 1.7 %
ALPHA 2 UR: 3.2 %
Albumin, U: 80.1 %
GAMMA GLOBULIN URINE: 6.5 %
TOTAL PROTEIN, URINE-UR/DAY: 1752 mg/(24.h) — AB (ref 30–150)
TOTAL VOLUME: 2000
Total Protein, Urine: 87.6 mg/dL

## 2018-04-07 ENCOUNTER — Inpatient Hospital Stay: Payer: Medicare Other

## 2018-04-08 ENCOUNTER — Inpatient Hospital Stay: Payer: Medicare Other | Attending: Oncology

## 2018-04-08 DIAGNOSIS — Z880 Allergy status to penicillin: Secondary | ICD-10-CM | POA: Insufficient documentation

## 2018-04-08 DIAGNOSIS — I2510 Atherosclerotic heart disease of native coronary artery without angina pectoris: Secondary | ICD-10-CM | POA: Diagnosis not present

## 2018-04-08 DIAGNOSIS — M109 Gout, unspecified: Secondary | ICD-10-CM | POA: Insufficient documentation

## 2018-04-08 DIAGNOSIS — Z79899 Other long term (current) drug therapy: Secondary | ICD-10-CM | POA: Diagnosis not present

## 2018-04-08 DIAGNOSIS — I129 Hypertensive chronic kidney disease with stage 1 through stage 4 chronic kidney disease, or unspecified chronic kidney disease: Secondary | ICD-10-CM | POA: Insufficient documentation

## 2018-04-08 DIAGNOSIS — R768 Other specified abnormal immunological findings in serum: Secondary | ICD-10-CM | POA: Insufficient documentation

## 2018-04-08 DIAGNOSIS — N183 Chronic kidney disease, stage 3 (moderate): Secondary | ICD-10-CM | POA: Insufficient documentation

## 2018-04-08 DIAGNOSIS — M1990 Unspecified osteoarthritis, unspecified site: Secondary | ICD-10-CM | POA: Diagnosis not present

## 2018-04-08 DIAGNOSIS — N401 Enlarged prostate with lower urinary tract symptoms: Secondary | ICD-10-CM | POA: Diagnosis not present

## 2018-04-08 DIAGNOSIS — R809 Proteinuria, unspecified: Secondary | ICD-10-CM | POA: Diagnosis not present

## 2018-04-08 DIAGNOSIS — Z7982 Long term (current) use of aspirin: Secondary | ICD-10-CM | POA: Insufficient documentation

## 2018-04-08 LAB — COMPREHENSIVE METABOLIC PANEL
ALBUMIN: 4.3 g/dL (ref 3.5–5.0)
ALK PHOS: 45 U/L (ref 38–126)
ALT: 28 U/L (ref 17–63)
AST: 29 U/L (ref 15–41)
Anion gap: 8 (ref 5–15)
BUN: 34 mg/dL — AB (ref 6–20)
CALCIUM: 9.1 mg/dL (ref 8.9–10.3)
CHLORIDE: 109 mmol/L (ref 101–111)
CO2: 23 mmol/L (ref 22–32)
Creatinine, Ser: 1.84 mg/dL — ABNORMAL HIGH (ref 0.61–1.24)
GFR calc Af Amer: 39 mL/min — ABNORMAL LOW (ref >60)
GFR calc non Af Amer: 33 mL/min — ABNORMAL LOW (ref >60)
GLUCOSE: 91 mg/dL (ref 65–99)
Potassium: 4.6 mmol/L (ref 3.5–5.1)
SODIUM: 140 mmol/L (ref 135–145)
TOTAL PROTEIN: 7.7 g/dL (ref 6.5–8.1)
Total Bilirubin: 0.5 mg/dL (ref 0.3–1.2)

## 2018-04-08 LAB — CBC WITH DIFFERENTIAL/PLATELET
Basophils Absolute: 0.1 10*3/uL (ref 0–0.1)
Basophils Relative: 1 %
Eosinophils Absolute: 0.4 10*3/uL (ref 0–0.7)
Eosinophils Relative: 6 %
HCT: 40.5 % (ref 40.0–52.0)
Hemoglobin: 13.7 g/dL (ref 13.0–18.0)
Lymphocytes Relative: 18 %
Lymphs Abs: 1.2 10*3/uL (ref 1.0–3.6)
MCH: 31.3 pg (ref 26.0–34.0)
MCHC: 33.8 g/dL (ref 32.0–36.0)
MCV: 92.6 fL (ref 80.0–100.0)
MONO ABS: 0.6 10*3/uL (ref 0.2–1.0)
MONOS PCT: 9 %
Neutro Abs: 4.2 10*3/uL (ref 1.4–6.5)
Neutrophils Relative %: 66 %
Platelets: 171 10*3/uL (ref 150–440)
RBC: 4.37 MIL/uL — ABNORMAL LOW (ref 4.40–5.90)
RDW: 13.6 % (ref 11.5–14.5)
WBC: 6.4 10*3/uL (ref 3.8–10.6)

## 2018-04-09 LAB — KAPPA/LAMBDA LIGHT CHAINS
KAPPA FREE LGHT CHN: 46.6 mg/L — AB (ref 3.3–19.4)
KAPPA, LAMDA LIGHT CHAIN RATIO: 2.09 — AB (ref 0.26–1.65)
Lambda free light chains: 22.3 mg/L (ref 5.7–26.3)

## 2018-04-10 LAB — MULTIPLE MYELOMA PANEL, SERUM
ALBUMIN SERPL ELPH-MCNC: 3.9 g/dL (ref 2.9–4.4)
Albumin/Glob SerPl: 1.3 (ref 0.7–1.7)
Alpha 1: 0.3 g/dL (ref 0.0–0.4)
Alpha2 Glob SerPl Elph-Mcnc: 0.9 g/dL (ref 0.4–1.0)
B-GLOBULIN SERPL ELPH-MCNC: 1.1 g/dL (ref 0.7–1.3)
Gamma Glob SerPl Elph-Mcnc: 1 g/dL (ref 0.4–1.8)
Globulin, Total: 3.2 g/dL (ref 2.2–3.9)
IGA: 334 mg/dL (ref 61–437)
IgG (Immunoglobin G), Serum: 892 mg/dL (ref 700–1600)
IgM (Immunoglobulin M), Srm: 101 mg/dL (ref 15–143)
TOTAL PROTEIN ELP: 7.1 g/dL (ref 6.0–8.5)

## 2018-04-14 ENCOUNTER — Ambulatory Visit: Payer: Medicare Other | Admitting: Oncology

## 2018-04-15 ENCOUNTER — Encounter: Payer: Self-pay | Admitting: Oncology

## 2018-04-15 ENCOUNTER — Inpatient Hospital Stay (HOSPITAL_BASED_OUTPATIENT_CLINIC_OR_DEPARTMENT_OTHER): Payer: Medicare Other | Admitting: Oncology

## 2018-04-15 VITALS — BP 165/81 | HR 71 | Temp 97.2°F | Resp 18 | Ht 69.0 in | Wt 249.9 lb

## 2018-04-15 DIAGNOSIS — R768 Other specified abnormal immunological findings in serum: Secondary | ICD-10-CM

## 2018-04-15 DIAGNOSIS — I129 Hypertensive chronic kidney disease with stage 1 through stage 4 chronic kidney disease, or unspecified chronic kidney disease: Secondary | ICD-10-CM | POA: Diagnosis not present

## 2018-04-15 DIAGNOSIS — Z79899 Other long term (current) drug therapy: Secondary | ICD-10-CM

## 2018-04-15 DIAGNOSIS — R809 Proteinuria, unspecified: Secondary | ICD-10-CM

## 2018-04-15 DIAGNOSIS — N183 Chronic kidney disease, stage 3 (moderate): Secondary | ICD-10-CM | POA: Diagnosis not present

## 2018-04-15 NOTE — Progress Notes (Signed)
No new changes noted today 

## 2018-04-15 NOTE — Progress Notes (Signed)
Hematology/Oncology Consult note [facility]  Telephone:([phone] [phone]  Patient Care Team: [name], MD as PCP - General (Family Medicine)   Name of the patient: Jeff Henderson  [id number]  05-Jan-1941   Date of visit: 04/15/18  Diagnosis- elevated free light chains due to CKD  Chief complaint/ Reason for visit- routine f/u of elevated serum free light chains  Heme/Onc history: Patientis a 77 year old Caucasian male with past medical occlusion of the artery disease, BPH stage III chronic kidney disease other medical problems. He was evaluated by [facility] for elevated kappa light chain. At that time it was thought that the elevated kappa light chain possibly as a result of chronic kidney disease rather than the cause.Last visit with Dr. Amalia Hailey was inJuly 2014.Patient also had a fat pad biopsy in 2014 did not reveal any evidence of malignancy or amyloidosis.Patient had a bone marrow biopsy which revealed a normocellular bone 40% with trilineage hematopoiesis. 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 Skeletal survey back then did not reveal any lytic lesions. Patient was also seen by Dr. Dewaine Oats from nephrology because of chronic kidney disease was thought to be due to NSAIDS.  Patient's most recent blood work was as follows: Serum Light Chains Were Elevated at 3.89 with a Free Light Chain Ratio Was Normal at 1.61.Serum immunofixation revealed no monoclonal protein. SPEP revealed asymmetric peakin the gamma region. 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 Monoclonal protein was not detected.Recent CBC from 09/03/2017 revealed a white count of 7.5, H&H of 14/42.5 and a platelet count of 202.BUN/creatinine was 30/1.8 has remained stable  Patient served for 4 years  in the [employer] and continues to be fit and active. Denies any pain today. No unintentional weight loss  Results of blood work from 09/30/2017 were as follows: CBC showed white count of 7.3, H&H of 13.9/41.5 with a platelet count of 193.  CMP was significant for elevated BUN and creatinine of 37/1.69 respectively which is chronic and stable.  Multiple myeloma panel did not reveal any monoclonal protein on SPEP or immunofixation.  Serum free light chains revealed elevated Of 41.7 and elevated lambda of 28.8 with a normal ratio of 1.45  Interval history-he remains active.  Denies any fatigue or unintentional weight loss.  Denies any aches or pains anywhere.  ECOG PS- 1 Pain scale- 0   Review of systems- Review of Systems  Constitutional: Negative for chills, fever, malaise/fatigue and weight loss.  HENT: Negative for congestion, ear discharge and nosebleeds.   Eyes: Negative for blurred vision.  Respiratory: Negative for cough, hemoptysis, sputum production, shortness of breath and wheezing.   Cardiovascular: Negative for chest pain, palpitations, orthopnea and claudication.  Gastrointestinal: Negative for abdominal pain, blood in stool, constipation, diarrhea, heartburn, melena, nausea and vomiting.  Genitourinary: Negative for dysuria, flank pain, frequency, hematuria and urgency.  Musculoskeletal: Negative for back pain, joint pain and myalgias.  Skin: Negative for rash.  Neurological: Negative for dizziness, tingling, focal weakness, seizures, weakness and headaches.  Endo/Heme/Allergies: Does not bruise/bleed easily.  Psychiatric/Behavioral: Negative for depression and suicidal ideas. The patient does not have insomnia.     Allergies  Allergen Reactions  . Penicillins Rash     Past Medical History:  Diagnosis Date  . Arthritis   . Gout   . Hernia, abdominal   . Hypertension      Past Surgical History:  Procedure Laterality Date  . carpel tunnel    . REPLACEMENT TOTAL  KNEE    . VEIN BYPASS SURGERY      Social History   Socioeconomic History  . Marital status: Unknown    Spouse name: Not on file  . Number of children: Not on file  . Years of education: Not on file  . Highest education level: Not on file  Occupational History  . Not on file  Social Needs  . Financial resource strain: Not on file  . Food insecurity:    Worry: Not on file    Inability: Not on file  . Transportation needs:    Medical: Not on file    Non-medical: Not on file  Tobacco Use  . Smoking status: Never Smoker  . Smokeless tobacco: Never Used  Substance and Sexual Activity  . Alcohol use: No    Alcohol/week: 0.0 oz  . Drug use: No  . Sexual activity: Yes  Lifestyle  . Physical activity:    Days per week: Not on file    Minutes per session: Not on file  . Stress: Not on file  Relationships  . Social connections:    Talks on phone: Not on file    Gets together: Not on file    Attends religious service: Not on file    Active member of club or organization: Not on file    Attends meetings of clubs or organizations: Not on file    Relationship status: Not on file  . Intimate partner violence:    Fear of current or ex partner: Not on file    Emotionally abused: Not on file    Physically abused: Not on file    Forced sexual activity: Not on file  Other Topics Concern  . Not on file  Social History Narrative  . Not on file    History reviewed. No pertinent family history.   Current Outpatient Medications:  .  amLODipine-benazepril (LOTREL) 10-20 MG per capsule, Take 1 capsule daily by mouth. , Disp: , Rfl: 0 .  aspirin 81 MG tablet, Take 81 mg daily by mouth. , Disp: , Rfl:  .  Multiple Vitamins-Minerals (MULTIVITAMIN ADULTS 50+ PO), Take 1 tablet daily by mouth. , Disp: , Rfl:  .  Omega-3 1000 MG CAPS, Take 1 capsule daily by mouth. , Disp: , Rfl:  .  CIALIS 5 MG tablet, Take 5 mg daily as needed by mouth. , Disp: , Rfl: 1  Current Facility-Administered  Medications:  .  triamcinolone acetonide (KENALOG) 10 MG/ML injection 10 mg, 10 mg, Other, Once, Landis Martins, DPM  Physical exam:  Vitals:   04/15/18 1011  BP: (!) 165/81  Pulse: 71  Resp: 18  Temp: (!) 97.2 F (36.2 C)  TempSrc: Tympanic  SpO2: 97%  Weight: 249 lb 14.4 oz (113.4 kg)  Height: 5' 9"  (1.753 m)   Physical Exam  Constitutional: He is oriented to person, place, and time. He appears well-developed and well-nourished.  HENT:  Head: Normocephalic and atraumatic.  Eyes: Pupils are equal, round, and reactive to light. EOM are normal.  Neck: Normal range of motion.  Cardiovascular: Normal rate, regular rhythm and normal heart sounds.  Pulmonary/Chest: Effort normal and breath sounds normal.  Abdominal: Soft. Bowel sounds are normal.  Neurological: He is alert and oriented to person, place, and time.  Skin: Skin is warm and dry.     CMP Latest Ref Rng & Units 04/08/2018  Glucose 65 - 99  mg/dL 91  BUN 6 - 20 mg/dL 34(H)  Creatinine 0.61 - 1.24 mg/dL 1.84(H)  Sodium 135 - 145 mmol/L 140  Potassium 3.5 - 5.1 mmol/L 4.6  Chloride 101 - 111 mmol/L 109  CO2 22 - 32 mmol/L 23  Calcium 8.9 - 10.3 mg/dL 9.1  Total Protein 6.5 - 8.1 g/dL 7.7  Total Bilirubin 0.3 - 1.2 mg/dL 0.5  Alkaline Phos 38 - 126 U/L 45  AST 15 - 41 U/L 29  ALT 17 - 63 U/L 28   CBC Latest Ref Rng & Units 04/08/2018  WBC 3.8 - 10.6 K/uL 6.4  Hemoglobin 13.0 - 18.0 g/dL 13.7  Hematocrit 40.0 - 52.0 % 40.5  Platelets 150 - 440 K/uL 171      Assessment and plan- Patient is a 77 y.o. male with elevated serum free light chains likely secondary to CKD   Labs done a week ago were reviewed. His hemoglobin is stable around 13.  White count and platelet count are normal.  His creatinine is mildly elevated at 1.8 today as compared to 1.696 months ago.  Myeloma panel still does not reveal any evidence of monoclonal protein kappa lambda ratio was normal 6 months ago and is mildly elevated at 2.09 today.   Patient has had a bone marrow biopsy in the past and currently I still do not see any evidence of overt multiple myeloma.  Continue to monitor.  Repeat CBC, CMP, myeloma panel and serum free light chains in 3 months in 6 months and I will see him back in 6 months    Visit Diagnosis 1. Elevated serum immunoglobulin free light chains      Dr. Randa Evens, MD, MPH [facility] at [facility] [phone] 04/15/2018 12:50 PM

## 2018-04-30 ENCOUNTER — Other Ambulatory Visit: Payer: Self-pay | Admitting: Student

## 2018-04-30 DIAGNOSIS — M5414 Radiculopathy, thoracic region: Secondary | ICD-10-CM

## 2018-04-30 DIAGNOSIS — M542 Cervicalgia: Secondary | ICD-10-CM

## 2018-05-12 ENCOUNTER — Ambulatory Visit
Admission: RE | Admit: 2018-05-12 | Discharge: 2018-05-12 | Disposition: A | Discharge status: Home / Self Care | Payer: Medicare Other | Source: Ambulatory Visit | Attending: Student | Admitting: Student

## 2018-05-12 ENCOUNTER — Ambulatory Visit: Payer: Medicare Other

## 2018-05-12 DIAGNOSIS — M47895 Other spondylosis, thoracolumbar region: Secondary | ICD-10-CM | POA: Diagnosis not present

## 2018-05-12 DIAGNOSIS — M47894 Other spondylosis, thoracic region: Secondary | ICD-10-CM | POA: Insufficient documentation

## 2018-05-12 DIAGNOSIS — M542 Cervicalgia: Secondary | ICD-10-CM | POA: Diagnosis present

## 2018-05-12 DIAGNOSIS — M4682 Other specified inflammatory spondylopathies, cervical region: Secondary | ICD-10-CM | POA: Insufficient documentation

## 2018-05-12 DIAGNOSIS — M4802 Spinal stenosis, cervical region: Secondary | ICD-10-CM | POA: Insufficient documentation

## 2018-05-12 DIAGNOSIS — M2578 Osteophyte, vertebrae: Secondary | ICD-10-CM | POA: Insufficient documentation

## 2018-05-12 DIAGNOSIS — M47892 Other spondylosis, cervical region: Secondary | ICD-10-CM | POA: Diagnosis not present

## 2018-05-12 DIAGNOSIS — M5414 Radiculopathy, thoracic region: Secondary | ICD-10-CM | POA: Diagnosis not present

## 2018-05-19 DIAGNOSIS — M720 Palmar fascial fibromatosis [Dupuytren]: Secondary | ICD-10-CM | POA: Insufficient documentation

## 2018-05-27 ENCOUNTER — Encounter: Payer: Self-pay | Admitting: Podiatry

## 2018-05-27 ENCOUNTER — Ambulatory Visit (INDEPENDENT_AMBULATORY_CARE_PROVIDER_SITE_OTHER): Payer: Medicare Other | Admitting: Podiatry

## 2018-05-27 DIAGNOSIS — M1A9XX1 Chronic gout, unspecified, with tophus (tophi): Secondary | ICD-10-CM

## 2018-05-27 DIAGNOSIS — L02612 Cutaneous abscess of left foot: Secondary | ICD-10-CM | POA: Diagnosis not present

## 2018-05-27 MED ORDER — DOXYCYCLINE HYCLATE 100 MG PO TABS: 100.0000 mg | ORAL_TABLET | Freq: Two times a day (BID) | ORAL | 0 refills | Status: DC

## 2018-05-27 MED ORDER — GENTAMICIN SULFATE 0.1 % EX CREA: 1.0000 "application " | TOPICAL_CREAM | Freq: Three times a day (TID) | CUTANEOUS | 1 refills | Status: DC

## 2018-05-30 LAB — WOUND CULTURE: Organism ID, Bacteria: NONE SEEN

## 2018-05-31 ENCOUNTER — Encounter: Payer: Self-pay | Admitting: Internal Medicine

## 2018-05-31 ENCOUNTER — Inpatient Hospital Stay
Admission: EM | Admit: 2018-05-31 | Discharge: 2018-06-03 | DRG: 281 | Disposition: A | Discharge status: Home / Self Care | Payer: Medicare Other | Attending: Specialist | Admitting: Specialist

## 2018-05-31 ENCOUNTER — Emergency Department: Payer: Medicare Other

## 2018-05-31 ENCOUNTER — Other Ambulatory Visit: Payer: Self-pay

## 2018-05-31 DIAGNOSIS — N183 Chronic kidney disease, stage 3 unspecified: Secondary | ICD-10-CM | POA: Diagnosis present

## 2018-05-31 DIAGNOSIS — R079 Chest pain, unspecified: Secondary | ICD-10-CM | POA: Diagnosis present

## 2018-05-31 DIAGNOSIS — Z96659 Presence of unspecified artificial knee joint: Secondary | ICD-10-CM | POA: Diagnosis present

## 2018-05-31 DIAGNOSIS — Z7982 Long term (current) use of aspirin: Secondary | ICD-10-CM

## 2018-05-31 DIAGNOSIS — I2510 Atherosclerotic heart disease of native coronary artery without angina pectoris: Secondary | ICD-10-CM | POA: Diagnosis present

## 2018-05-31 DIAGNOSIS — E785 Hyperlipidemia, unspecified: Secondary | ICD-10-CM | POA: Diagnosis present

## 2018-05-31 DIAGNOSIS — I214 Non-ST elevation (NSTEMI) myocardial infarction: Secondary | ICD-10-CM | POA: Diagnosis not present

## 2018-05-31 DIAGNOSIS — I25810 Atherosclerosis of coronary artery bypass graft(s) without angina pectoris: Secondary | ICD-10-CM | POA: Diagnosis present

## 2018-05-31 DIAGNOSIS — Z79899 Other long term (current) drug therapy: Secondary | ICD-10-CM

## 2018-05-31 DIAGNOSIS — Z8249 Family history of ischemic heart disease and other diseases of the circulatory system: Secondary | ICD-10-CM

## 2018-05-31 DIAGNOSIS — I10 Essential (primary) hypertension: Secondary | ICD-10-CM | POA: Diagnosis present

## 2018-05-31 DIAGNOSIS — I129 Hypertensive chronic kidney disease with stage 1 through stage 4 chronic kidney disease, or unspecified chronic kidney disease: Secondary | ICD-10-CM | POA: Diagnosis present

## 2018-05-31 DIAGNOSIS — Z880 Allergy status to penicillin: Secondary | ICD-10-CM

## 2018-05-31 HISTORY — DX: Atherosclerotic heart disease of native coronary artery without angina pectoris: I25.10

## 2018-05-31 HISTORY — DX: Chronic kidney disease, stage 3 unspecified: N18.30

## 2018-05-31 HISTORY — DX: Chronic kidney disease, stage 3 (moderate): N18.3

## 2018-05-31 LAB — CBC
HEMATOCRIT: 40.2 % (ref 40.0–52.0)
HEMOGLOBIN: 13.7 g/dL (ref 13.0–18.0)
MCH: 31.8 pg (ref 26.0–34.0)
MCHC: 34.1 g/dL (ref 32.0–36.0)
MCV: 93.4 fL (ref 80.0–100.0)
Platelets: 170 10*3/uL (ref 150–440)
RBC: 4.31 MIL/uL — AB (ref 4.40–5.90)
RDW: 13.1 % (ref 11.5–14.5)
WBC: 6.7 10*3/uL (ref 3.8–10.6)

## 2018-05-31 LAB — BASIC METABOLIC PANEL
Anion gap: 10 (ref 5–15)
BUN: 38 mg/dL — ABNORMAL HIGH (ref 8–23)
CO2: 20 mmol/L — AB (ref 22–32)
Calcium: 9.1 mg/dL (ref 8.9–10.3)
Chloride: 107 mmol/L (ref 98–111)
Creatinine, Ser: 1.51 mg/dL — ABNORMAL HIGH (ref 0.61–1.24)
GFR calc non Af Amer: 42 mL/min — ABNORMAL LOW (ref >60)
GFR, EST AFRICAN AMERICAN: 49 mL/min — AB (ref >60)
Glucose, Bld: 192 mg/dL — ABNORMAL HIGH (ref 70–99)
POTASSIUM: 4.7 mmol/L (ref 3.5–5.1)
SODIUM: 137 mmol/L (ref 135–145)

## 2018-05-31 LAB — PROTIME-INR
INR: 1.04
PROTHROMBIN TIME: 13.5 s (ref 11.4–15.2)

## 2018-05-31 LAB — APTT: aPTT: 34 seconds (ref 24–36)

## 2018-05-31 LAB — TROPONIN I: Troponin I: 0.03 ng/mL (ref <0.03)

## 2018-05-31 MED ORDER — ASPIRIN 81 MG PO CHEW
324.0000 mg | CHEWABLE_TABLET | Freq: Once | ORAL | Status: AC
  Administered 2018-05-31: 324 mg via ORAL
  Filled 2018-05-31: qty 4

## 2018-05-31 MED ORDER — HEPARIN (PORCINE) IN NACL 100-0.45 UNIT/ML-% IJ SOLN
1100.0000 [IU]/h | INTRAMUSCULAR | Status: DC
  Administered 2018-05-31 – 2018-06-02 (×3): 1100 [IU]/h via INTRAVENOUS
  Filled 2018-05-31 (×3): qty 250

## 2018-05-31 MED ORDER — SODIUM CHLORIDE 0.9 % IV BOLUS
500.0000 mL | Freq: Once | INTRAVENOUS | Status: AC
  Administered 2018-05-31: 500 mL via INTRAVENOUS

## 2018-05-31 MED ORDER — HEPARIN BOLUS VIA INFUSION
4000.0000 [IU] | Freq: Once | INTRAVENOUS | Status: AC
  Administered 2018-05-31: 4000 [IU] via INTRAVENOUS
  Filled 2018-05-31: qty 4000

## 2018-05-31 MED ORDER — NITROGLYCERIN 0.4 MG SL SUBL
0.4000 mg | SUBLINGUAL_TABLET | SUBLINGUAL | Status: DC | PRN
  Administered 2018-05-31 (×2): 0.4 mg via SUBLINGUAL
  Filled 2018-05-31: qty 1

## 2018-05-31 NOTE — ED Triage Notes (Signed)
Patient reports symptoms for approximately 1 hour.  Reports mid sternal chest pain with shortness of breath, and radiating down left arm.

## 2018-05-31 NOTE — Consult Note (Signed)
ANTICOAGULATION CONSULT NOTE - Initial Consult  Pharmacy Consult for Heparin Drip  Indication: chest pain/ACS  Allergies  Allergen Reactions  . Penicillins Rash   Patient Measurements: Height: 5' 8"  (172.7 cm) Weight: 249 lb (112.9 kg) IBW/kg (Calculated) : 68.4 Heparin Dosing Weight: 90kg  Vital Signs: Temp: 97.7 F (36.5 C) (06/29 2038) Temp Source: Oral (06/29 2038) BP: 169/91 (06/29 2114) Pulse Rate: 81 (06/29 2114)  Labs: Recent Labs    05/31/18 2103  HGB 13.7  HCT 40.2  PLT 170    CrCl cannot be calculated (Patient's most recent lab result is older than the maximum 21 days allowed.).  Medical History: Past Medical History:  Diagnosis Date  . Arthritis   . Gout   . Hernia, abdominal   . Hypertension    Assessment: Pharmacy consulted for heparin drip dosing and monitoring for 77yo male admitted with ACS.  Goal of Therapy:  Heparin level 0.3-0.7 units/ml Monitor platelets by anticoagulation protocol: Yes   Plan:  Give 4000 units bolus x 1 Start heparin infusion at 1100 units/hr Check anti-Xa level in 8 hours and daily while on heparin Continue to monitor H&H and platelets  Pernell Dupre, PharmD, BCPS Clinical Pharmacist 05/31/2018 9:25 PM

## 2018-05-31 NOTE — ED Notes (Signed)
Pt reports 0.4 SL Nitro tablet given by Dr Alfred Levins at 930 pm.

## 2018-05-31 NOTE — ED Provider Notes (Signed)
[facility] Emergency Department Provider Note  ____________________________________________  Time seen: Approximately 9:13 PM  I have reviewed the triage vital signs and the nursing notes.   HISTORY  Chief Complaint Chest Pain   HPI Jeff Henderson is a 77 y.o. male for history of CAD status post CABG, chronic kidney disease, hypertension, hyperlipidemia who presents for evaluation of chest pain.  Patient has been having exertional chest pain for a month.  Today started having chest pain an hour ago at rest.  Describes the pain as pressure located in the center of his chest radiating down his left arm and associated with shortness of breath.  No dizziness, no nausea or vomiting.  Patient does not take nitro.  Is not on Plavix.  Pain at this time is 7 out of 10.  Patient denies personal or family history of blood clots, recent travel immobilization, leg pain or swelling, hemoptysis, or exogenous hormones. No paresthesias of his extremities, no radiation to the back.   Past Medical History:  Diagnosis Date  . Arthritis   . Gout   . Hernia, abdominal   . Hypertension     Patient Active Problem List   Diagnosis Date Noted  . 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 Arteriosclerosis of coronary artery 12/17/2014  . BP (high blood pressure) 12/17/2014  . Impaired renal function 12/17/2014  . Kidney cysts 10/23/2012  . 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 Polypharmacy 01/30/2012  . Arthritis, degenerative 01/30/2012    Past Surgical History:  Procedure Laterality Date  . carpel tunnel    . REPLACEMENT TOTAL KNEE    . VEIN BYPASS SURGERY      Prior to Admission medications   Medication Sig Start Date End Date Taking? Authorizing Provider  amLODipine-benazepril (LOTREL) 10-20 MG per capsule Take 1 capsule daily by mouth.  10/19/14   [provider]  aspirin 81 MG tablet Take 81 mg daily by mouth.  04/20/10   [provider]  carvedilol (COREG)  3.125 MG tablet  05/22/18   [provider]  celecoxib (CELEBREX) 100 MG capsule  04/25/18   [provider]  CIALIS 5 MG tablet Take 5 mg daily as needed by mouth.  11/12/14   [provider]  doxycycline (VIBRA-TABS) 100 MG tablet Take 1 tablet (100 mg total) by mouth 2 (two) times daily. 05/27/18   Edrick Kins, DPM  gentamicin cream (GARAMYCIN) 0.1 % Apply 1 application topically 3 (three) times daily. 05/27/18   Edrick Kins, DPM  Multiple Vitamins-Minerals (MULTIVITAMIN ADULTS 50+ PO) Take 1 tablet daily by mouth.  04/20/10   [provider]  Omega-3 1000 MG CAPS Take 1 capsule daily by mouth.  01/17/11   [provider]  omega-3 acid ethyl esters (LOVAZA) 1 g capsule Take by mouth. 01/17/11   [provider]    Allergies Penicillins  No family history on file.  Social History Social History   Tobacco Use  . Smoking status: Never Smoker  . Smokeless tobacco: Never Used  Substance Use Topics  . Alcohol use: No    Alcohol/week: 0.0 oz  . Drug use: No    Review of Systems  Constitutional: Negative for fever. Eyes: Negative for visual changes. ENT: Negative for sore throat. Neck: No neck pain  Cardiovascular: +chest pain. Respiratory: +shortness of breath. Gastrointestinal: Negative for abdominal pain, vomiting or diarrhea. Genitourinary: Negative for dysuria. Musculoskeletal: Negative for back pain. Skin: Negative for rash. Neurological: Negative for headaches, weakness  or numbness. Psych: No SI or HI  ____________________________________________   PHYSICAL EXAM:  VITAL SIGNS: ED Triage Vitals  Enc Vitals Group     BP 05/31/18 2038 (!) 189/87     Pulse Rate 05/31/18 2038 87     Resp 05/31/18 2038 18     Temp 05/31/18 2038 97.7 F (36.5 C)     Temp Source 05/31/18 2038 Oral     SpO2 05/31/18 2038 97 %     Weight 05/31/18 2037 249 lb (112.9 kg)     Height 05/31/18 2037 5' 8"  (1.727 m)     Head Circumference  --      Peak Flow --      Pain Score 05/31/18 2036 7     Pain Loc --      Pain Edu? --      Excl. in [place]? --     Constitutional: Alert and oriented. Well appearing and in no apparent distress. HEENT:      Head: Normocephalic and atraumatic.         Eyes: Conjunctivae are normal. Sclera is non-icteric.       Mouth/Throat: Mucous membranes are moist.       Neck: Supple with no signs of meningismus. Cardiovascular: Regular rate and rhythm. No murmurs, gallops, or rubs. 2+ symmetrical distal pulses are present in all extremities. No JVD. Respiratory: Normal respiratory effort. Lungs are clear to auscultation bilaterally. No wheezes, crackles, or rhonchi.  Gastrointestinal: Soft, non tender, and non distended with positive bowel sounds. No rebound or guarding. Musculoskeletal: Nontender with normal range of motion in all extremities. No edema, cyanosis, or erythema of extremities. Neurologic: Normal speech and language. Face is symmetric. Moving all extremities. No gross focal neurologic deficits are appreciated. Skin: Skin is warm, dry and intact. No rash noted. Psychiatric: Mood and affect are normal. Speech and behavior are normal.  ____________________________________________   LABS (all labs ordered are listed, but only abnormal results are displayed)  Labs Reviewed  BASIC METABOLIC PANEL - Abnormal; Notable for the following components:      Result Value   CO2 20 (*)    Glucose, Bld 192 (*)    BUN 38 (*)    Creatinine, Ser 1.51 (*)    GFR calc non Af Amer 42 (*)    GFR calc Af Amer 49 (*)    All other components within normal limits  CBC - Abnormal; Notable for the following components:   RBC 4.31 (*)    All other components within normal limits  TROPONIN I - Abnormal; Notable for the following components:   Troponin I 0.03 (*)    All other components within normal limits  APTT  PROTIME-INR  HEPARIN LEVEL (UNFRACTIONATED)  CBC    ____________________________________________  EKG  ED ECG REPORT I, Rudene Re, the attending physician, personally viewed and interpreted this ECG.  Normal sinus rhythm, rate of 87, normal intervals, normal axis, less than 1 mm ST elevation in inferior leads with T wave inversions in 1 and aVL.  No prior for comparison  20:50 -normal sinus rhythm, rate of 87, normal intervals, normal axis, persistent ST elevation in lead III which is less than 1 mm and T wave inversions in 1 and aVL.  Unchanged from initial one. ____________________________________________  RADIOLOGY  I have personally reviewed the images performed during this visit and I agree with the Radiologist's read.   Interpretation by Radiologist:  Dg Chest Portable 1 View  Result Date: 05/31/2018 CLINICAL DATA:  Chest pain and shortness of breath. EXAM: PORTABLE CHEST 1 VIEW COMPARISON:  None. FINDINGS: The heart size and mediastinal contours are within normal limits. Normal pulmonary vascularity. No focal consolidation, pleural effusion, or pneumothorax. Prior median sternotomy with fractures of the three superior most sternal wires. No acute osseous abnormality. IMPRESSION: No active disease. Electronically Signed   By: Titus Dubin M.D.   On: 05/31/2018 21:15     ____________________________________________   PROCEDURES  Procedure(s) performed: None Procedures Critical Care performed: yes  CRITICAL CARE Performed by: Rudene Re  ?  Total critical care time: 35 min  Critical care time was exclusive of separately billable procedures and treating other patients.  Critical care was necessary to treat or prevent imminent or life-threatening deterioration.  Critical care was time spent personally by me on the following activities: development of treatment plan with patient and/or surrogate as well as nursing, discussions with consultants, evaluation of patient's response to treatment,  examination of patient, obtaining history from patient or surrogate, ordering and performing treatments and interventions, ordering and review of laboratory studies, ordering and review of radiographic studies, pulse oximetry and re-evaluation of patient's condition.  ____________________________________________   INITIAL IMPRESSION / ASSESSMENT AND PLAN / ED COURSE  77 y.o. male for history of CAD status post CABG, chronic kidney disease, hypertension, hyperlipidemia who presents for evaluation of chest pain.  History concerning for ACS.  Initial EKG showing less than 1 mm ST elevation in 3 and aVF with T wave inversions in 1 and aVL.  There is no prior for comparison.  A repeat EKG done 10 minutes later is unchanged when compared to the initial one.  At this time, not meeting STEMI criteria. Spoke with Dr. Fletcher Anon who agrees and recommended pain control, heparin and admission.  If unable to control pain plan for cath emergently otherwise will plan for cath in the morning.    _________________________ 10:19 PM on 05/31/2018 -----------------------------------------  After 3 sublingual nitros patient is now pain-free.  First troponin 0 0.03.  Patient is on heparin drip.  Will admit to the hospitalist service.   As part of my medical decision making, I reviewed the following data within the [place] notes reviewed and incorporated, Labs reviewed , EKG interpreted , Old chart reviewed, Radiograph reviewed , Discussed with admitting physician , A consult was requested and obtained from this/these consultant(s) Cardiology, Notes from prior ED visits and [place] Controlled Substance Database    Pertinent labs & imaging results that were available during my care of the patient were reviewed by me and considered in my medical decision making (see chart for details).    ____________________________________________   FINAL CLINICAL IMPRESSION(S) / ED DIAGNOSES  Final  diagnoses:  NSTEMI (non-ST elevated myocardial infarction) ([place])      NEW MEDICATIONS STARTED DURING THIS VISIT:  ED Discharge Orders    None       Note:  This document was prepared using Dragon voice recognition software and may include unintentional dictation errors.    Rudene Re, MD 05/31/18 2219

## 2018-05-31 NOTE — H&P (Signed)
[facility] [place] NAME: Jeff Henderson    MR#:  [id number]  DATE OF BIRTH:  1941/08/18  DATE OF ADMISSION:  05/31/2018  PRIMARY CARE PHYSICIAN: [name], MD   REQUESTING/REFERRING PHYSICIAN: Alfred Levins, MD  CHIEF COMPLAINT:   Chief Complaint  Patient presents with  . Chest Pain    HISTORY OF PRESENT ILLNESS:  Jeff Henderson  is a 77 y.o. male who presents with an episode of chest pain.  This occurred at rest today, but patient states that it felt like his chest pain years ago when he had MI and had to have CABG.  His pain did not resolve until after he got to the ED and had several doses of sublingual nitro.  He is currently pain-free.  Hospitalist were called for admission  PAST MEDICAL HISTORY:   Past Medical History:  Diagnosis Date  . Arthritis   . CAD (coronary artery disease)   . CKD (chronic kidney disease), stage III ([place])   . Gout   . Hernia, abdominal   . Hypertension      PAST SURGICAL HISTORY:   Past Surgical History:  Procedure Laterality Date  . carpel tunnel    . REPLACEMENT TOTAL KNEE    . VEIN BYPASS SURGERY       SOCIAL HISTORY:   Social History   Tobacco Use  . Smoking status: Never Smoker  . Smokeless tobacco: Never Used  Substance Use Topics  . Alcohol use: No    Alcohol/week: 0.0 oz     FAMILY HISTORY:   Family History  Problem Relation Age of Onset  . Asthma Mother   . Diabetes Father   . Hypertension Father      DRUG ALLERGIES:   Allergies  Allergen Reactions  . Penicillins Rash and Other (See Comments)    Has patient had a PCN reaction causing immediate rash, facial/tongue/throat swelling, SOB or lightheadedness with hypotension: No Has patient had a PCN reaction causing severe rash involving mucus membranes or skin necrosis: No Has patient had a PCN reaction that required hospitalization: No Has patient had a PCN reaction occurring within the last 10 years:  No If all of the above answers are "NO", then may proceed with Cephalosporin use.     MEDICATIONS AT HOME:   Prior to Admission medications   Medication Sig Start Date End Date Taking? Authorizing Provider  amLODipine-benazepril (LOTREL) 10-40 MG capsule Take 1 capsule by mouth daily.  10/19/14  Yes [provider]  aspirin 81 MG tablet Take 81 mg daily by mouth.  04/20/10  Yes [provider]  carvedilol (COREG) 3.125 MG tablet Take 3.125 mg by mouth 2 (two) times daily with a meal.  05/22/18  Yes [provider]  celecoxib (CELEBREX) 100 MG capsule Take 100 mg by mouth daily.  04/25/18  Yes [provider]  doxycycline (VIBRA-TABS) 100 MG tablet Take 1 tablet (100 mg total) by mouth 2 (two) times daily. 05/27/18  Yes Edrick Kins, DPM  gentamicin cream (GARAMYCIN) 0.1 % Apply 1 application topically 3 (three) times daily. 05/27/18  Yes Edrick Kins, DPM  Multiple Vitamins-Minerals (MULTIVITAMIN ADULTS 50+ PO) Take 1 tablet daily by mouth.  04/20/10  Yes [provider]  omega-3 acid ethyl esters (LOVAZA) 1 g capsule Take 1 g by mouth daily.  01/17/11  Yes [provider]  predniSONE (DELTASONE) 5 MG tablet Take 1-6 tablets by mouth as directed. Taper tablets  6,5,4,3,2,1 05/30/18  Yes [provider]    REVIEW OF SYSTEMS:  Review of Systems  Constitutional: Negative for chills, fever, malaise/fatigue and weight loss.  HENT: Negative for ear pain, hearing loss and tinnitus.   Eyes: Negative for blurred vision, double vision, pain and redness.  Respiratory: Negative for cough, hemoptysis and shortness of breath.   Cardiovascular: Positive for chest pain. Negative for palpitations, orthopnea and leg swelling.  Gastrointestinal: Negative for abdominal pain, constipation, diarrhea, nausea and vomiting.  Genitourinary: Negative for dysuria, frequency and hematuria.  Musculoskeletal: Negative for back pain, joint pain and neck pain.   Skin:       No acne, rash, or lesions  Neurological: Negative for dizziness, tremors, focal weakness and weakness.  Endo/Heme/Allergies: Negative for polydipsia. Does not bruise/bleed easily.  Psychiatric/Behavioral: Negative for depression. The patient is not nervous/anxious and does not have insomnia.      VITAL SIGNS:   Vitals:   05/31/18 2145 05/31/18 2200 05/31/18 2230 05/31/18 2300  BP: (!) 153/85 140/83 (!) 119/95 (!) 160/69  Pulse: 77 80 67 (!) 58  Resp: 17 17 12  (!) 9  Temp:      TempSrc:      SpO2: 96% 97% 98% 96%  Weight:      Height:       Wt Readings from Last 3 Encounters:  05/31/18 112.9 kg (249 lb)  04/15/18 113.4 kg (249 lb 14.4 oz)  10/14/17 114.8 kg (253 lb 1.4 oz)    PHYSICAL EXAMINATION:  Physical Exam  Vitals reviewed. Constitutional: He is oriented to person, place, and time. He appears well-developed and well-nourished. No distress.  HENT:  Head: Normocephalic and atraumatic.  Mouth/Throat: Oropharynx is clear and moist.  Eyes: Pupils are equal, round, and reactive to light. Conjunctivae and EOM are normal. No scleral icterus.  Neck: Normal range of motion. Neck supple. No JVD present. No thyromegaly present.  Cardiovascular: Normal rate, regular rhythm and intact distal pulses. Exam reveals no gallop and no friction rub.  No murmur heard. Respiratory: Effort normal and breath sounds normal. No respiratory distress. He has no wheezes. He has no rales.  GI: Soft. Bowel sounds are normal. He exhibits no distension. There is no tenderness.  Musculoskeletal: Normal range of motion. He exhibits no edema.  No arthritis, no gout  Lymphadenopathy:    He has no cervical adenopathy.  Neurological: He is alert and oriented to person, place, and time. No cranial nerve deficit.  No dysarthria, no aphasia  Skin: Skin is warm and dry. No rash noted. No erythema.  Psychiatric: He has a normal mood and affect. His behavior is normal. Judgment and thought content  normal.    LABORATORY PANEL:   CBC Recent Labs  Lab 05/31/18 2103  WBC 6.7  HGB 13.7  HCT 40.2  PLT 170   ------------------------------------------------------------------------------------------------------------------  Chemistries  Recent Labs  Lab 05/31/18 2103  NA 137  K 4.7  CL 107  CO2 20*  GLUCOSE 192*  BUN 38*  CREATININE 1.51*  CALCIUM 9.1   ------------------------------------------------------------------------------------------------------------------  Cardiac Enzymes Recent Labs  Lab 05/31/18 2103  TROPONINI 0.03*   ------------------------------------------------------------------------------------------------------------------  RADIOLOGY:  Dg Chest Portable 1 View  Result Date: 05/31/2018 CLINICAL DATA:  Chest pain and shortness of breath. EXAM: PORTABLE CHEST 1 VIEW COMPARISON:  None. FINDINGS: The heart size and mediastinal contours are within normal limits. Normal pulmonary vascularity. No focal consolidation, pleural effusion, or pneumothorax. Prior median sternotomy with fractures of the three superior most sternal  wires. No acute osseous abnormality. IMPRESSION: No active disease. Electronically Signed   By: Titus Dubin M.D.   On: 05/31/2018 21:15    EKG:   Orders placed or performed during the hospital encounter of 05/31/18  . ED EKG within 10 minutes  . ED EKG within 10 minutes  . EKG 12-Lead  . EKG 12-Lead  . EKG 12-Lead  . EKG 12-Lead  . EKG 12-Lead  . EKG 12-Lead    IMPRESSION AND PLAN:  Principal Problem:   Chest pain -strong suspicion for cardiac etiology.  Initial troponin was okay, we will trend his cardiac enzymes tonight, keep him on a heparin drip which was started by ED physician, and get an echocardiogram and a cardiology consult. Active Problems:   Arteriosclerosis of coronary artery -continue home meds, other work-up as above   HTN (hypertension) -continue home medications   CKD (chronic kidney disease), stage  III (HCC) -at baseline, avoid nephrotoxins and monitor  Chart review performed and case discussed with ED provider. Labs, imaging and/or ECG reviewed by provider and discussed with patient/family. Management plans discussed with the patient and/or family.  DVT PROPHYLAXIS: Systemic anticoagulation  GI PROPHYLAXIS: None  ADMISSION STATUS: Observation  CODE STATUS: Full  TOTAL TIME TAKING CARE OF THIS PATIENT: 40 minutes.   Garyson Stelly [place] 05/31/2018, 11:21 PM  [facility]  Office  [phone]  CC: Primary care physician; [name], MD  Note:  This document was prepared using Dragon voice recognition software and may include unintentional dictation errors.

## 2018-06-01 DIAGNOSIS — Z79899 Other long term (current) drug therapy: Secondary | ICD-10-CM | POA: Diagnosis not present

## 2018-06-01 DIAGNOSIS — I214 Non-ST elevation (NSTEMI) myocardial infarction: Secondary | ICD-10-CM | POA: Diagnosis present

## 2018-06-01 DIAGNOSIS — Z8249 Family history of ischemic heart disease and other diseases of the circulatory system: Secondary | ICD-10-CM | POA: Diagnosis not present

## 2018-06-01 DIAGNOSIS — Z7982 Long term (current) use of aspirin: Secondary | ICD-10-CM | POA: Diagnosis not present

## 2018-06-01 DIAGNOSIS — Z880 Allergy status to penicillin: Secondary | ICD-10-CM | POA: Diagnosis not present

## 2018-06-01 DIAGNOSIS — E785 Hyperlipidemia, unspecified: Secondary | ICD-10-CM | POA: Diagnosis present

## 2018-06-01 DIAGNOSIS — I129 Hypertensive chronic kidney disease with stage 1 through stage 4 chronic kidney disease, or unspecified chronic kidney disease: Secondary | ICD-10-CM | POA: Diagnosis present

## 2018-06-01 DIAGNOSIS — N183 Chronic kidney disease, stage 3 (moderate): Secondary | ICD-10-CM | POA: Diagnosis present

## 2018-06-01 DIAGNOSIS — I25810 Atherosclerosis of coronary artery bypass graft(s) without angina pectoris: Secondary | ICD-10-CM | POA: Diagnosis present

## 2018-06-01 DIAGNOSIS — I2510 Atherosclerotic heart disease of native coronary artery without angina pectoris: Secondary | ICD-10-CM | POA: Diagnosis present

## 2018-06-01 DIAGNOSIS — Z96659 Presence of unspecified artificial knee joint: Secondary | ICD-10-CM | POA: Diagnosis present

## 2018-06-01 LAB — BASIC METABOLIC PANEL
ANION GAP: 7 (ref 5–15)
BUN: 35 mg/dL — ABNORMAL HIGH (ref 8–23)
CHLORIDE: 111 mmol/L (ref 98–111)
CO2: 21 mmol/L — ABNORMAL LOW (ref 22–32)
Calcium: 9.1 mg/dL (ref 8.9–10.3)
Creatinine, Ser: 1.51 mg/dL — ABNORMAL HIGH (ref 0.61–1.24)
GFR calc non Af Amer: 42 mL/min — ABNORMAL LOW (ref >60)
GFR, EST AFRICAN AMERICAN: 49 mL/min — AB (ref >60)
Glucose, Bld: 117 mg/dL — ABNORMAL HIGH (ref 70–99)
POTASSIUM: 4.5 mmol/L (ref 3.5–5.1)
SODIUM: 139 mmol/L (ref 135–145)

## 2018-06-01 LAB — TROPONIN I
Troponin I: 4.12 ng/mL (ref <0.03)
Troponin I: 7.29 ng/mL (ref <0.03)

## 2018-06-01 LAB — HEPARIN LEVEL (UNFRACTIONATED): Heparin Unfractionated: 0.56 IU/mL (ref 0.30–0.70)

## 2018-06-01 LAB — CBC
HCT: 39 % — ABNORMAL LOW (ref 40.0–52.0)
Hemoglobin: 13.4 g/dL (ref 13.0–18.0)
MCH: 32.2 pg (ref 26.0–34.0)
MCHC: 34.4 g/dL (ref 32.0–36.0)
MCV: 93.7 fL (ref 80.0–100.0)
PLATELETS: 155 10*3/uL (ref 150–440)
RBC: 4.16 MIL/uL — ABNORMAL LOW (ref 4.40–5.90)
RDW: 13.1 % (ref 11.5–14.5)
WBC: 7.4 10*3/uL (ref 3.8–10.6)

## 2018-06-01 MED ORDER — ONDANSETRON HCL 4 MG PO TABS: 4.0000 mg | ORAL_TABLET | Freq: Four times a day (QID) | ORAL | Status: DC | PRN

## 2018-06-01 MED ORDER — SODIUM CHLORIDE 0.9% FLUSH
3.0000 mL | Freq: Two times a day (BID) | INTRAVENOUS | Status: DC
  Administered 2018-06-01 – 2018-06-02 (×2): 3 mL via INTRAVENOUS

## 2018-06-01 MED ORDER — CARVEDILOL 6.25 MG PO TABS
6.2500 mg | ORAL_TABLET | Freq: Two times a day (BID) | ORAL | Status: DC
  Administered 2018-06-01 – 2018-06-02 (×4): 6.25 mg via ORAL
  Filled 2018-06-01 (×4): qty 1

## 2018-06-01 MED ORDER — ONDANSETRON HCL 4 MG/2ML IJ SOLN: 4.0000 mg | Freq: Four times a day (QID) | INTRAMUSCULAR | Status: DC | PRN

## 2018-06-01 MED ORDER — ASPIRIN 81 MG PO CHEW
81.0000 mg | CHEWABLE_TABLET | ORAL | Status: AC
  Administered 2018-06-02: 81 mg via ORAL
  Filled 2018-06-01: qty 1

## 2018-06-01 MED ORDER — NITROGLYCERIN 2 % TD OINT
1.0000 [in_us] | TOPICAL_OINTMENT | Freq: Four times a day (QID) | TRANSDERMAL | Status: DC
  Administered 2018-06-01 – 2018-06-03 (×8): 1 [in_us] via TOPICAL
  Filled 2018-06-01 (×8): qty 1

## 2018-06-01 MED ORDER — ACETAMINOPHEN 650 MG RE SUPP: 650.0000 mg | Freq: Four times a day (QID) | RECTAL | Status: DC | PRN

## 2018-06-01 MED ORDER — ATORVASTATIN CALCIUM 20 MG PO TABS
40.0000 mg | ORAL_TABLET | Freq: Every day | ORAL | Status: DC
  Administered 2018-06-01 – 2018-06-02 (×2): 40 mg via ORAL
  Filled 2018-06-01 (×2): qty 2

## 2018-06-01 MED ORDER — CARVEDILOL 3.125 MG PO TABS: 3.1250 mg | ORAL_TABLET | Freq: Two times a day (BID) | ORAL | Status: DC

## 2018-06-01 MED ORDER — SODIUM CHLORIDE 0.9 % IV SOLN: 250.0000 mL | INTRAVENOUS | Status: DC | PRN

## 2018-06-01 MED ORDER — SODIUM CHLORIDE 0.9 % WEIGHT BASED INFUSION
3.0000 mL/kg/h | INTRAVENOUS | Status: AC
  Administered 2018-06-02: 3 mL/kg/h via INTRAVENOUS

## 2018-06-01 MED ORDER — CARVEDILOL 6.25 MG PO TABS: 6.2500 mg | ORAL_TABLET | Freq: Two times a day (BID) | ORAL | Status: DC

## 2018-06-01 MED ORDER — BACITRACIN-NEOMYCIN-POLYMYXIN OINTMENT TUBE
TOPICAL_OINTMENT | Freq: Two times a day (BID) | CUTANEOUS | Status: DC
  Administered 2018-06-01 – 2018-06-03 (×4): via TOPICAL
  Filled 2018-06-01: qty 14.17

## 2018-06-01 MED ORDER — ASPIRIN EC 81 MG PO TBEC
81.0000 mg | DELAYED_RELEASE_TABLET | Freq: Every day | ORAL | Status: DC
  Administered 2018-06-01 – 2018-06-03 (×2): 81 mg via ORAL
  Filled 2018-06-01 (×2): qty 1

## 2018-06-01 MED ORDER — SODIUM CHLORIDE 0.9 % WEIGHT BASED INFUSION: 1.0000 mL/kg/h | INTRAVENOUS | Status: DC

## 2018-06-01 MED ORDER — OXYCODONE HCL 5 MG PO TABS
5.0000 mg | ORAL_TABLET | ORAL | Status: DC | PRN
  Administered 2018-06-03 (×3): 5 mg via ORAL
  Filled 2018-06-01 (×2): qty 1

## 2018-06-01 MED ORDER — SODIUM CHLORIDE 0.9% FLUSH: 3.0000 mL | INTRAVENOUS | Status: DC | PRN

## 2018-06-01 MED ORDER — ACETAMINOPHEN 325 MG PO TABS: 650.0000 mg | ORAL_TABLET | Freq: Four times a day (QID) | ORAL | Status: DC | PRN

## 2018-06-01 NOTE — Consult Note (Signed)
Cardiology Consultation Note    Patient ID: Neziah Braley, MRN: [id number], DOB/AGE: [id number] y.o. Admit date: 05/31/2018   Date of Consult: 06/01/2018 Primary Physician: [name], MD Primary Cardiologist: Dr. Saralyn Pilar  Chief Complaint: chest pain Reason for Consultation: chest pain Requesting MD: Dr. Verdell Carmine  HPI: Jaicion Laurie is a 77 y.o. male with history of coronary artery disease status post coronary bypass grafting who presents emergency room with complaints of chest pain and is ruled in for a non-ST elevation myocardial infarction.  Patient has a history of chronic kidney disease grade 3, hypertension, hyperlipidemia.  He is status post coronary artery bypass grafting in 1995 with a left internal mammary to the LAD, saphenous vein graft to the left circumflex, saphenous vein graft to the PDA.  He had an abnormal stress test in 2011 and underwent cardiac catheterization revealing normal LV function with an occluded proximal left circumflex, occluded proximal RCA, occluded mid LAD.  Had a patent saphenous vein graft to the PDA.  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  Decision at that time was not to stent the subclavian stenosis.  Patient has been begun having exertional chest pain.  Patient began having chest pain and saw his cardiologist last week.  Was set up for a functional study however pain is recurred.  Presented to the emergency room where his initial troponin was normal with a subsequent troponin of 4.12.  Electrocardiogram revealed sinus rhythm with Q waves in the inferior leads, with borderline lateral ST depression.  Electrocardiogram was evaluated by the STEMI team and felt not to require emergent cath.  Patient was placed on heparin, continued with carvedilol, aspirin area renal function  mildly reduced with a creatinine of 1.5 and a GFR of 42.  This appears to be at baseline.  Chest x-ray reveals no active disease.  Past Medical History:  Diagnosis Date  . Arthritis   . CAD (coronary artery disease)   . CKD (chronic kidney disease), stage III ([place])   . Gout   . Hernia, abdominal   . Hypertension       Surgical History:  Past Surgical History:  Procedure Laterality Date  . carpel tunnel    . REPLACEMENT TOTAL KNEE    . VEIN BYPASS SURGERY       Home Meds: Prior to Admission medications   Medication Sig Start Date End Date Taking? Authorizing Provider  amLODipine-benazepril (LOTREL) 10-40 MG capsule Take 1 capsule by mouth daily.  10/19/14  Yes [provider]  aspirin 81 MG tablet Take 81 mg daily by mouth.  04/20/10  Yes [provider]  carvedilol (COREG) 3.125 MG tablet Take 3.125 mg by mouth 2 (two) times daily with a meal.  05/22/18  Yes [provider]  celecoxib (CELEBREX) 100 MG capsule Take 100 mg by mouth daily.  04/25/18  Yes [provider]  doxycycline (VIBRA-TABS) 100 MG tablet Take 1 tablet (100 mg total) by mouth 2 (two) times daily. 05/27/18  Yes Edrick Kins, DPM  gentamicin cream (GARAMYCIN) 0.1 % Apply 1 application topically 3 (three) times daily. 05/27/18  Yes Edrick Kins, DPM  Multiple Vitamins-Minerals (MULTIVITAMIN ADULTS 50+ PO) Take 1 tablet daily by mouth.  04/20/10  Yes [provider]  omega-3 acid ethyl esters (LOVAZA) 1 g capsule  Take 1 g by mouth daily.  01/17/11  Yes [provider]  predniSONE (DELTASONE) 5 MG tablet Take 1-6 tablets by mouth as directed. Taper tablets 6,5,4,3,2,1 05/30/18  Yes [provider]    Inpatient Medications:  . aspirin EC  81 mg Oral Daily  . carvedilol  3.125 mg Oral BID WC   . heparin 1,100 Units/hr (05/31/18 2152)    Allergies:  Allergies  Allergen Reactions  . Penicillins Rash and Other (See Comments)    Has patient had a PCN  reaction causing immediate rash, facial/tongue/throat swelling, SOB or lightheadedness with hypotension: No Has patient had a PCN reaction causing severe rash involving mucus membranes or skin necrosis: No Has patient had a PCN reaction that required hospitalization: No Has patient had a PCN reaction occurring within the last 10 years: No If all of the above answers are "NO", then may proceed with Cephalosporin use.     Social History   Socioeconomic History  . Marital status: Unknown    Spouse name: Not on file  . Number of children: Not on file  . Years of education: Not on file  . Highest education level: Not on file  Occupational History  . Not on file  Social Needs  . Financial resource strain: Not on file  . Food insecurity:    Worry: Not on file    Inability: Not on file  . Transportation needs:    Medical: Not on file    Non-medical: Not on file  Tobacco Use  . Smoking status: Never Smoker  . Smokeless tobacco: Never Used  Substance and Sexual Activity  . Alcohol use: No    Alcohol/week: 0.0 oz  . Drug use: No  . Sexual activity: Yes  Lifestyle  . Physical activity:    Days per week: Not on file    Minutes per session: Not on file  . Stress: Not on file  Relationships  . Social connections:    Talks on phone: Not on file    Gets together: Not on file    Attends religious service: Not on file    Active member of club or organization: Not on file    Attends meetings of clubs or organizations: Not on file    Relationship status: Not on file  . Intimate partner violence:    Fear of current or ex partner: Not on file    Emotionally abused: Not on file    Physically abused: Not on file    Forced sexual activity: Not on file  Other Topics Concern  . Not on file  Social History Narrative  . Not on file     Family History  Problem Relation Age of Onset  . Asthma Mother   . Diabetes Father   . Hypertension Father      Review of Systems: A 12-system  review of systems was performed and is negative except as noted in the HPI.  Labs: Recent Labs    05/31/18 2103 06/01/18 0249  TROPONINI 0.03* 4.12*   Lab Results  Component Value Date   WBC 7.4 06/01/2018   HGB 13.4 06/01/2018   HCT 39.0 (L) 06/01/2018   MCV 93.7 06/01/2018   PLT 155 06/01/2018    Recent Labs  Lab 06/01/18 0249  NA 139  K 4.5  CL 111  CO2 21*  BUN 35*  CREATININE 1.51*  CALCIUM 9.1  GLUCOSE 117*   No results found for: CHOL, HDL, LDLCALC, TRIG No results found  for: DDIMER  Radiology/Studies:  Mr Cervical Spine Wo Contrast  Result Date: 05/13/2018 CLINICAL DATA:  Posterior headache and neck pain. Bilateral shoulder pain and back pain. EXAM: MRI CERVICAL SPINE WITHOUT CONTRAST TECHNIQUE: Multiplanar, multisequence MR imaging of the cervical spine was performed. No intravenous contrast was administered. COMPARISON:  None. FINDINGS: Alignment: Normal except for 2 mm anterolisthesis C5-6 and C7-T1. Vertebrae: No fracture or primary bone lesion. Cord: No cord compression or primary cord lesion. Posterior Fossa, vertebral arteries, paraspinal tissues: No abnormality seen. Disc levels: Abnormality at foramen magnum. Ordinary mild osteoarthritis at the C1-2 articulation roach on neural spaces. C2-3: Facet joint effusion on the right. Mild bulging of the disc. No canal or foraminal stenosis. C3-4: Bulging of the disc with uncovertebral hypertrophy more prominent towards the left. Bilateral facet osteoarthritis. No compressive canal stenosis. Bilateral foraminal narrowing worse on the left. C4-5: Bilateral facet arthropathy. Mild bulging of the disc. No compressive canal stenosis. Bilateral foraminal narrowing. C5-6: Bilateral facet arthropathy with 2 mm of anterolisthesis. Bulging of the disc. No central canal stenosis. Bilateral foraminal narrowing. C6-7: Spondylosis with endplate osteophytes and bulging of the disc more prominent towards the left. Narrowing of the ventral  subarachnoid space but no compression cord. Bilateral foraminal stenosis, worse on left. C7-T1: Facet osteoarthritis with 1 or 2 mm of anterolisthesis. No disc pathology. No canal or foraminal stenosis IMPRESSION: Degenerative cervical spondylosis and facet arthritis as described above. No compressive canal stenosis. Bilateral foraminal stenosis that could cause neural compression at C4-5, C5-6 and C6-7. Left foraminal narrowing at C3-4. It appears that the facet joint on the right at C2-3 is chronically fused. Electronically Signed   By: Nelson Chimes M.D.   On: 05/13/2018 07:02   Mr Thoracic Spine Wo Contrast  Result Date: 05/13/2018 CLINICAL DATA:  Neck bilateral shoulder and central back pain worsening over the last month. EXAM: MRI THORACIC SPINE WITHOUT CONTRAST TECHNIQUE: Multiplanar, multisequence MR imaging of the thoracic spine was performed. No intravenous contrast was administered. COMPARISON:  None. FINDINGS: Alignment:  Normal Vertebrae: No fracture or primary bone lesion. Patient appears to have solid bridging osteophytes T2 T12, suggesting diffuse idiopathic skeletal hyperostosis. There is quite lap a functional fusion of the thoracic spine with no thoracic motion occurring below T1-2 or above T12-L1. Cord:  Normal.  Ample subarachnoid space surrounding the cord. Paraspinal and other soft tissues: Negative Disc levels: T1-2: Mild bulging of the disc. Mild facet osteoarthritis. No compressive stenosis. No disc bulge or herniation or any stenosis of the canal or foramina from T2-3 through T11-12. T12-L1: Mild bulging of the disc. Mild facet osteoarthritis. No stenosis. 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 Because the spine is functionally fused through that region, there is no disc pathology. No stenosis of canal or foramina. Mild degenerative changes at T1-2 and T12-L1 but of the or foramina.  Electronically Signed   By: Nelson Chimes M.D.   On: 05/13/2018 07:07   Dg Chest Portable 1 View  Result Date: 05/31/2018 CLINICAL DATA:  Chest pain and shortness of breath. EXAM: PORTABLE CHEST 1 VIEW COMPARISON:  None. FINDINGS: The heart size and mediastinal contours are within normal limits. Normal pulmonary vascularity. No focal consolidation, pleural effusion, or pneumothorax. Prior median sternotomy with fractures of the three superior most sternal wires. No acute osseous abnormality. IMPRESSION: No active disease. Electronically Signed   By: Titus Dubin M.D.   On: 05/31/2018 21:15    Wt Readings  from Last 3 Encounters:  06/01/18 116 kg (255 lb 12.8 oz)  04/15/18 113.4 kg (249 lb 14.4 oz)  10/14/17 114.8 kg (253 lb 1.4 oz)    EKG: Sinus rhythm with borderline inferior and inferolateral ischemic changes.  Physical Exam:  Blood pressure (!) 161/71, pulse 70, temperature 97.7 F (36.5 C), temperature source Oral, resp. rate 17, height 5' 7"  (1.702 m), weight 116 kg (255 lb 12.8 oz), SpO2 92 %. Body mass index is 40.06 kg/m. General: Well developed, well nourished, in no acute distress. Head: Normocephalic, atraumatic, sclera non-icteric, no xanthomas, nares are without discharge.  Neck: Negative for carotid bruits. JVD not elevated. Lungs: Clear bilaterally to auscultation without wheezes, rales, or rhonchi. Breathing is unlabored. Heart: RRR with S1 S2. No murmurs, rubs, or gallops appreciated. Abdomen: Soft, non-tender, non-distended with normoactive bowel sounds. No hepatomegaly. No rebound/guarding. No obvious abdominal masses. Msk:  Strength and tone appear normal for age. Extremities: No clubbing or cyanosis. No edema.  Distal pedal pulses are 2+ and equal bilaterally. Neuro: Alert and oriented X 3. No facial asymmetry. No focal deficit. Moves all extremities spontaneously. Psych:  Responds to questions appropriately with a normal affect.     Assessment and Plan   77 year old male with history of coronary artery disease x3 in 1995, history of grade 2-3 kidney disease who was admitted with progressive rest chest pain.  He has ruled in for non-ST elevation myocardial infarction with a serum troponin thus far 4.21.  He is currently asymptomatic on heparin.  Patient had a cardiac catheterization in 2011 which revealed total of native vessels with an occluded saphenous vein graft to a marginal and a patent saphenous grain graft to the PDA as well as a patent left internal mammary to the LAD.  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  He has done fairly well works out daily in the gym until a week ago when he developed foot pain and had to stop.  He had no chest pain with activity.  He was evaluated as an outpatient on Friday and set up for a functional study.  Pain recurred and he presented to the ER.  He is currently stable.  Will need to continue with aspirin and heparin.  Will increase carvedilol to 6.25 mg twice daily and add high intensity statin atorvastatin 40 mg daily.  Will add topical nitrates.  Will proceed with left heart cath in the morning to evaluate for evidence of progression of his graft disease to guide further therapy.  Recommendations after cath is completed.  Signed, Teodoro Spray MD 06/01/2018, 8:34 AM Pager: [phone]

## 2018-06-01 NOTE — Progress Notes (Signed)
   HPI: 77 year old male with a history of gout presents today for evaluation of pain to the plantar aspect of his fifth MTPJ left foot.  Patient states that he feels like he walking on a rock.  This is been going on for 1 of the past week with a sudden onset.  Patient denies trauma.  Very painful to walk and stand at times.  He notices a callus/wound on the bottom of his left foot.  He has been soaking it in Epsom salt and using ice with no alleviation of symptoms.  Past Medical History:  Diagnosis Date  . Arthritis   . CAD (coronary artery disease)   . CKD (chronic kidney disease), stage III ([place])   . Gout   . Hernia, abdominal   . Hypertension       Physical Exam: General: The patient is alert and oriented x3 in no acute distress.  Dermatology: Ulcer noted to the plantar aspect of the fifth MPJ left foot measuring approximately 1 cm x 1 cm x 0.8 cm.  Periwound erythema noted.  No malodor noted.  There is some chalky white drainage noted likely consistent with gouty tophi.  Periwound integrity is intact.  There is no exposed bone muscle tendon ligament or joint.  Vascular: Palpable pedal pulses bilaterally. No edema or erythema noted. Capillary refill within normal limits.  Neurological: Epicritic and protective threshold grossly intact bilaterally.   Musculoskeletal Exam: Range of motion within normal limits to all pedal and ankle joints bilateral. Muscle strength 5/5 in all groups bilateral.  Significant amount of pain on palpation along the fifth MTPJ left foot.  Assessment: 1.  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   Plan of Care:  1. Patient evaluated.  2.  Incision and drainage of the abscess lesion in the gouty tophi was performed using a combination of a tissue nipper and blunt dissection.  All tophaceous drainage was expressed from the area and cultures were taken and sent to pathology. 3.  Prescription for doxycycline 100 mg 4.   Prescription for gentamicin cream to be applied daily 5.  Today postoperative shoe was dispensed.  Weightbearing as tolerated 6.  Return to clinic in 2 weeks      Edrick Kins, DPM [facility]  Dr. Edrick Kins, DPM    [address], [place] [address]                Office [phone]  Fax [phone]

## 2018-06-01 NOTE — Progress Notes (Signed)
CRITICAL VALUE ALERT  Critical Value: Troponin 4.12  Date & Time Notied:  06/01/18 0350  Provider Notified: Marcille Blanco, MD  Orders Received/Actions taken: No new orders at this time.  Nursing staff will continue to monitor for any changes in patient status.  Earleen Reaper, RN

## 2018-06-01 NOTE — Progress Notes (Signed)
ANTICOAGULATION CONSULT NOTE - Initial Consult  Pharmacy Consult for Heparin  Indication: chest pain/ACS  Allergies  Allergen Reactions  . Penicillins Rash and Other (See Comments)    Has patient had a PCN reaction causing immediate rash, facial/tongue/throat swelling, SOB or lightheadedness with hypotension: No Has patient had a PCN reaction causing severe rash involving mucus membranes or skin necrosis: No Has patient had a PCN reaction that required hospitalization: No Has patient had a PCN reaction occurring within the last 10 years: No If all of the above answers are "NO", then may proceed with Cephalosporin use.     Patient Measurements: Height: 5' 7"  (170.2 cm) Weight: 255 lb 12.8 oz (116 kg) IBW/kg (Calculated) : 66.1 Heparin Dosing Weight: 90 kg   Vital Signs: Temp: 97.7 F (36.5 C) (06/30 0336) Temp Source: Oral (06/30 0336) BP: 161/71 (06/30 0336) Pulse Rate: 70 (06/30 0336)  Labs: Recent Labs    05/31/18 2103 05/31/18 2113 06/01/18 0249 06/01/18 0514  HGB 13.7  --  13.4  --   HCT 40.2  --  39.0*  --   PLT 170  --  155  --   APTT  --  34  --   --   LABPROT  --  13.5  --   --   INR  --  1.04  --   --   HEPARINUNFRC  --   --   --  0.56  CREATININE 1.51*  --  1.51*  --   TROPONINI 0.03*  --  4.12*  --     Estimated Creatinine Clearance: 49.1 mL/min (A) (by C-G formula based on SCr of 1.51 mg/dL (H)).   Medical History: Past Medical History:  Diagnosis Date  . Arthritis   . CAD (coronary artery disease)   . CKD (chronic kidney disease), stage III ([place])   . Gout   . Hernia, abdominal   . Hypertension     Medications:  Facility-Administered Medications Prior to Admission  Medication Dose Route Frequency Provider Last Rate Last Dose  . triamcinolone acetonide (KENALOG) 10 MG/ML injection 10 mg  10 mg Other Once Landis Martins, DPM       Medications Prior to Admission  Medication Sig Dispense Refill Last Dose  . amLODipine-benazepril (LOTREL)  10-40 MG capsule Take 1 capsule by mouth daily.   0 05/31/2018 at Unknown time  . aspirin 81 MG tablet Take 81 mg daily by mouth.    05/31/2018 at Unknown time  . carvedilol (COREG) 3.125 MG tablet Take 3.125 mg by mouth 2 (two) times daily with a meal.   2 05/31/2018 at 1030  . celecoxib (CELEBREX) 100 MG capsule Take 100 mg by mouth daily.   0 05/31/2018 at Unknown time  . doxycycline (VIBRA-TABS) 100 MG tablet Take 1 tablet (100 mg total) by mouth 2 (two) times daily. 20 tablet 0 05/31/2018 at Unknown time  . gentamicin cream (GARAMYCIN) 0.1 % Apply 1 application topically 3 (three) times daily. 30 g 1 05/31/2018 at Unknown time  . Multiple Vitamins-Minerals (MULTIVITAMIN ADULTS 50+ PO) Take 1 tablet daily by mouth.    05/31/2018 at Unknown time  . omega-3 acid ethyl esters (LOVAZA) 1 g capsule Take 1 g by mouth daily.    05/31/2018 at Unknown time  . predniSONE (DELTASONE) 5 MG tablet Take 1-6 tablets by mouth as directed. Taper tablets 6,5,4,3,2,1  0 05/31/2018 at Unknown time    Assessment: Pharmacy consulted for heparin drip dosing and monitoring for 77yo male admitted  with ACS.   Goal of Therapy:  Heparin level 0.3-0.7 units/ml Monitor platelets by anticoagulation protocol: Yes   Plan:  6/30:  HL @ 0514 = 0.56 Will continue pt on current rate and recheck HL on 7/1 with AM labs.   Karandeep Resende D 06/01/2018,6:10 AM

## 2018-06-01 NOTE — Progress Notes (Signed)
[facility] NAME: Jeff Henderson    MR#:  [id number]  DATE OF BIRTH:  1941/11/26  SUBJECTIVE:   Patient here due to chest pain and ruled in for a non-ST elevation MI.  Currently chest pain-free and denies any other complaints.  Plan for cardiac catheterization tomorrow as per cardiology.  REVIEW OF SYSTEMS:    Review of Systems  Constitutional: Negative for chills and fever.  HENT: Negative for congestion and tinnitus.   Eyes: Negative for blurred vision and double vision.  Respiratory: Negative for cough, shortness of breath and wheezing.   Cardiovascular: Negative for chest pain, orthopnea and PND.  Gastrointestinal: Negative for abdominal pain, diarrhea, nausea and vomiting.  Genitourinary: Negative for dysuria and hematuria.  Neurological: Negative for dizziness, sensory change and focal weakness.  All other systems reviewed and are negative.   Nutrition: Heart healthy Tolerating Diet: Yes Tolerating PT: Ambulatory   DRUG ALLERGIES:   Allergies  Allergen Reactions  . Penicillins Rash and Other (See Comments)    Has patient had a PCN reaction causing immediate rash, facial/tongue/throat swelling, SOB or lightheadedness with hypotension: No Has patient had a PCN reaction causing severe rash involving mucus membranes or skin necrosis: No Has patient had a PCN reaction that required hospitalization: No Has patient had a PCN reaction occurring within the last 10 years: No If all of the above answers are "NO", then may proceed with Cephalosporin use.     VITALS:  Blood pressure (!) 165/90, pulse 61, temperature 97.7 F (36.5 C), temperature source Oral, resp. rate 18, height 5' 7"  (1.702 m), weight 116 kg (255 lb 12.8 oz), SpO2 99 %.  PHYSICAL EXAMINATION:   Physical Exam  GENERAL:  77 y.o.-year-old obese patient lying in bed in no acute distress.  EYES: Pupils equal, round, reactive to light and accommodation. No  scleral icterus. Extraocular muscles intact.  HEENT: Head atraumatic, normocephalic. Oropharynx and nasopharynx clear.  NECK:  Supple, no jugular venous distention. No thyroid enlargement, no tenderness.  LUNGS: Normal breath sounds bilaterally, no wheezing, rales, rhonchi. No use of accessory muscles of respiration.  CARDIOVASCULAR: S1, S2 normal. No murmurs, rubs, or gallops.  ABDOMEN: Soft, nontender, nondistended. Bowel sounds present. No organomegaly or mass.  EXTREMITIES: No cyanosis, clubbing or edema b/l.    NEUROLOGIC: Cranial nerves II through XII are intact. No focal Motor or sensory deficits b/l.   PSYCHIATRIC: The patient is alert and oriented x 3.  SKIN: No obvious rash, lesion, or ulcer.    LABORATORY PANEL:   CBC Recent Labs  Lab 06/01/18 0249  WBC 7.4  HGB 13.4  HCT 39.0*  PLT 155   ------------------------------------------------------------------------------------------------------------------  Chemistries  Recent Labs  Lab 06/01/18 0249  NA 139  K 4.5  CL 111  CO2 21*  GLUCOSE 117*  BUN 35*  CREATININE 1.51*  CALCIUM 9.1   ------------------------------------------------------------------------------------------------------------------  Cardiac Enzymes Recent Labs  Lab 06/01/18 0900  TROPONINI 7.29*   ------------------------------------------------------------------------------------------------------------------  RADIOLOGY:  Dg Chest Portable 1 View  Result Date: 05/31/2018 CLINICAL DATA:  Chest pain and shortness of breath. EXAM: PORTABLE CHEST 1 VIEW COMPARISON:  None. FINDINGS: The heart size and mediastinal contours are within normal limits. Normal pulmonary vascularity. No focal consolidation, pleural effusion, or pneumothorax. Prior median sternotomy with fractures of the three superior most sternal wires. No acute osseous abnormality. IMPRESSION: No active disease. Electronically Signed   By: Titus Dubin M.D.   On: 05/31/2018 21:15  ASSESSMENT AND PLAN:   77 year old male with past medical history of hypertension, gout, chronic kidney disease stage III, history of coronary artery disease status post bypass, osteoarthritis who presents to the hospital with chest pain.  1.  Non-ST elevation MI- patient presented to the hospital with chest pain and has ruled in by cardiac markers.  -Patient's troponins have trended upwards from 0.03 to 7.29 as of earlier today -Patient is although chest pain-free and stable presently.  Continue aspirin, heparin drip, atorvastatin, carvedilol -Appreciate cardiology input and plan for cardiac catheterization tomorrow.  2.  Essential hypertension-continue carvedilol.  3.  Hyperlipidemia-continue atorvastatin.  4.  Chronic kidney disease stage III-patient's creatinine is close to baseline.  Will need to follow renal function post cardiac catheterization.     All the records are reviewed and case discussed with Care Management/Social Worker. Management plans discussed with the patient, family and they are in agreement.  CODE STATUS: Full code  DVT Prophylaxis: Hep. gtt  TOTAL TIME TAKING CARE OF THIS PATIENT: 30 minutes.   POSSIBLE D/C IN 1-2 DAYS, DEPENDING ON CLINICAL CONDITION.   Henreitta Leber M.D on 06/01/2018 at 12:01 PM  Between 7am to 6pm - Pager - [phone]  After 6pm go to [email or web address] - password [email or web address]  Office  [phone]  CC: Primary care physician; [name], MD

## 2018-06-02 ENCOUNTER — Inpatient Hospital Stay
Admit: 2018-06-02 | Discharge: 2018-06-02 | Disposition: A | Discharge status: Home / Self Care | Payer: Medicare Other | Attending: Internal Medicine | Admitting: Internal Medicine

## 2018-06-02 LAB — ECHOCARDIOGRAM COMPLETE
Height: 67 in
Weight: 4092.8 oz

## 2018-06-02 LAB — HEPARIN LEVEL (UNFRACTIONATED): Heparin Unfractionated: 0.59 IU/mL (ref 0.30–0.70)

## 2018-06-02 MED ORDER — BENAZEPRIL HCL 20 MG PO TABS
40.0000 mg | ORAL_TABLET | Freq: Every day | ORAL | Status: DC
  Filled 2018-06-02: qty 2

## 2018-06-02 MED ORDER — AMLODIPINE BESYLATE 10 MG PO TABS
10.0000 mg | ORAL_TABLET | Freq: Every day | ORAL | Status: DC
  Administered 2018-06-03: 10 mg via ORAL
  Filled 2018-06-02: qty 1

## 2018-06-02 MED ORDER — OMEGA-3-ACID ETHYL ESTERS 1 G PO CAPS
1.0000 g | ORAL_CAPSULE | Freq: Every day | ORAL | Status: DC
  Administered 2018-06-03: 1 g via ORAL
  Filled 2018-06-02: qty 1

## 2018-06-02 NOTE — Progress Notes (Signed)
*  PRELIMINARY RESULTS* Echocardiogram 2D Echocardiogram has been performed.  Jeff Henderson 06/02/2018, 9:02 AM

## 2018-06-02 NOTE — Progress Notes (Signed)
Per Dr. Saralyn Pilar cardiac cath will be performed tomorrow 7/2/ Dr. Verdell Carmine made aware/ pt and pts wife made aware/ will continue to monitor

## 2018-06-02 NOTE — Progress Notes (Signed)
Cath postponed until tomorrow due to scheduling. Keep on heparin and cath in am per Dr. Saralyn Pilar.

## 2018-06-02 NOTE — Progress Notes (Signed)
Pt refusing bed alarm / encouraged pt to continue to ask for assistance before getting out of bed/ call bell and phone within reach.

## 2018-06-02 NOTE — Progress Notes (Signed)
ANTICOAGULATION CONSULT NOTE - Initial Consult  Pharmacy Consult for Heparin  Indication: chest pain/ACS  Allergies  Allergen Reactions  . Penicillins Rash and Other (See Comments)    Has patient had a PCN reaction causing immediate rash, facial/tongue/throat swelling, SOB or lightheadedness with hypotension: No Has patient had a PCN reaction causing severe rash involving mucus membranes or skin necrosis: No Has patient had a PCN reaction that required hospitalization: No Has patient had a PCN reaction occurring within the last 10 years: No If all of the above answers are "NO", then may proceed with Cephalosporin use.     Patient Measurements: Height: 5' 7"  (170.2 cm) Weight: 255 lb 12.8 oz (116 kg) IBW/kg (Calculated) : 66.1 Heparin Dosing Weight: 90 kg   Vital Signs: Temp: 97.6 F (36.4 C) (07/01 0512) Temp Source: Oral (06/30 2059) BP: 120/51 (07/01 0512) Pulse Rate: 51 (07/01 0512)  Labs: Recent Labs    05/31/18 2103 05/31/18 2113 06/01/18 0249 06/01/18 0514 06/01/18 0900 06/02/18 0425  HGB 13.7  --  13.4  --   --   --   HCT 40.2  --  39.0*  --   --   --   PLT 170  --  155  --   --   --   APTT  --  34  --   --   --   --   LABPROT  --  13.5  --   --   --   --   INR  --  1.04  --   --   --   --   HEPARINUNFRC  --   --   --  0.56  --  0.59  CREATININE 1.51*  --  1.51*  --   --   --   TROPONINI 0.03*  --  4.12*  --  7.29*  --     Estimated Creatinine Clearance: 49.1 mL/min (A) (by C-G formula based on SCr of 1.51 mg/dL (H)).   Medical History: Past Medical History:  Diagnosis Date  . Arthritis   . CAD (coronary artery disease)   . CKD (chronic kidney disease), stage III ([place])   . Gout   . Hernia, abdominal   . Hypertension     Medications:  Facility-Administered Medications Prior to Admission  Medication Dose Route Frequency Provider Last Rate Last Dose  . triamcinolone acetonide (KENALOG) 10 MG/ML injection 10 mg  10 mg Other Once Landis Martins,  DPM       Medications Prior to Admission  Medication Sig Dispense Refill Last Dose  . amLODipine-benazepril (LOTREL) 10-40 MG capsule Take 1 capsule by mouth daily.   0 05/31/2018 at Unknown time  . aspirin 81 MG tablet Take 81 mg daily by mouth.    05/31/2018 at Unknown time  . carvedilol (COREG) 3.125 MG tablet Take 3.125 mg by mouth 2 (two) times daily with a meal.   2 05/31/2018 at 1030  . celecoxib (CELEBREX) 100 MG capsule Take 100 mg by mouth daily.   0 05/31/2018 at Unknown time  . doxycycline (VIBRA-TABS) 100 MG tablet Take 1 tablet (100 mg total) by mouth 2 (two) times daily. 20 tablet 0 05/31/2018 at Unknown time  . gentamicin cream (GARAMYCIN) 0.1 % Apply 1 application topically 3 (three) times daily. 30 g 1 05/31/2018 at Unknown time  . Multiple Vitamins-Minerals (MULTIVITAMIN ADULTS 50+ PO) Take 1 tablet daily by mouth.    05/31/2018 at Unknown time  . omega-3 acid ethyl esters (LOVAZA) 1  g capsule Take 1 g by mouth daily.    05/31/2018 at Unknown time  . predniSONE (DELTASONE) 5 MG tablet Take 1-6 tablets by mouth as directed. Taper tablets 6,5,4,3,2,1  0 05/31/2018 at Unknown time    Assessment: Pharmacy consulted for heparin drip dosing and monitoring for 77yo male admitted with ACS.   Goal of Therapy:  Heparin level 0.3-0.7 units/ml Monitor platelets by anticoagulation protocol: Yes   Plan:  6/30:  HL @ 0514 = 0.56 Will continue pt on current rate and recheck HL on 7/1 with AM labs.   7/1:  HL @ 0500 = 0.59 Will continue pt on current rate and recheck HL on 7/2 with AM labs.   Naela Nodal D 06/02/2018,5:29 AM

## 2018-06-02 NOTE — Care Management (Signed)
Admitted from home with nstemi. Independent in all adls, denies issues accessing medical care, obtaining medications or with transportation.  Current with  PCP.  For cardiac cath today.

## 2018-06-02 NOTE — Progress Notes (Signed)
[facility] [place] [facility] [place] NAME: Jeff Henderson    MR#:  [id number]  DATE OF BIRTH:  03-20-1941  SUBJECTIVE:   Patient here due to chest pain and ruled in for a non-ST elevation MI.  Currently chest pain-free and denies any other complaints.   Cardiac cath had to be postponed due to scheduling and to be done tomorrow now.  No acute events overnight.   REVIEW OF SYSTEMS:    Review of Systems  Constitutional: Negative for chills and fever.  HENT: Negative for congestion and tinnitus.   Eyes: Negative for blurred vision and double vision.  Respiratory: Negative for cough, shortness of breath and wheezing.   Cardiovascular: Negative for chest pain, orthopnea and PND.  Gastrointestinal: Negative for abdominal pain, diarrhea, nausea and vomiting.  Genitourinary: Negative for dysuria and hematuria.  Neurological: Negative for dizziness, sensory change and focal weakness.  All other systems reviewed and are negative.   Nutrition: Heart healthy Tolerating Diet: Yes Tolerating PT: Ambulatory   DRUG ALLERGIES:   Allergies  Allergen Reactions  . Penicillins Rash and Other (See Comments)    Has patient had a PCN reaction causing immediate rash, facial/tongue/throat swelling, SOB or lightheadedness with hypotension: No Has patient had a PCN reaction causing severe rash involving mucus membranes or skin necrosis: No Has patient had a PCN reaction that required hospitalization: No Has patient had a PCN reaction occurring within the last 10 years: No If all of the above answers are "NO", then may proceed with Cephalosporin use.     VITALS:  Blood pressure (!) 156/75, pulse 61, temperature 97.7 F (36.5 C), resp. rate 18, height 5' 7"  (1.702 m), weight 116 kg (255 lb 12.8 oz), SpO2 93 %.  PHYSICAL EXAMINATION:   Physical Exam  GENERAL:  77 y.o.-year-old obese patient lying in bed in no acute distress.  EYES: Pupils equal, round, reactive to light  and accommodation. No scleral icterus. Extraocular muscles intact.  HEENT: Head atraumatic, normocephalic. Oropharynx and nasopharynx clear.  NECK:  Supple, no jugular venous distention. No thyroid enlargement, no tenderness.  LUNGS: Normal breath sounds bilaterally, no wheezing, rales, rhonchi. No use of accessory muscles of respiration.  CARDIOVASCULAR: S1, S2 normal. No murmurs, rubs, or gallops.  ABDOMEN: Soft, nontender, nondistended. Bowel sounds present. No organomegaly or mass.  EXTREMITIES: No cyanosis, clubbing or edema b/l.    NEUROLOGIC: Cranial nerves II through XII are intact. No focal Motor or sensory deficits b/l.   PSYCHIATRIC: The patient is alert and oriented x 3.  SKIN: No obvious rash, lesion, or ulcer.    LABORATORY PANEL:   CBC Recent Labs  Lab 06/01/18 0249  WBC 7.4  HGB 13.4  HCT 39.0*  PLT 155   ------------------------------------------------------------------------------------------------------------------  Chemistries  Recent Labs  Lab 06/01/18 0249  NA 139  K 4.5  CL 111  CO2 21*  GLUCOSE 117*  BUN 35*  CREATININE 1.51*  CALCIUM 9.1   ------------------------------------------------------------------------------------------------------------------  Cardiac Enzymes Recent Labs  Lab 06/01/18 0900  TROPONINI 7.29*   ------------------------------------------------------------------------------------------------------------------  RADIOLOGY:  Dg Chest Portable 1 View  Result Date: 05/31/2018 CLINICAL DATA:  Chest pain and shortness of breath. EXAM: PORTABLE CHEST 1 VIEW COMPARISON:  None. FINDINGS: The heart size and mediastinal contours are within normal limits. Normal pulmonary vascularity. No focal consolidation, pleural effusion, or pneumothorax. Prior median sternotomy with fractures of the three superior most sternal wires. No acute osseous abnormality. IMPRESSION: No active disease. Electronically Signed  By: Titus Dubin M.D.    On: 05/31/2018 21:15     ASSESSMENT AND PLAN:   77 year old male with past medical history of hypertension, gout, chronic kidney disease stage III, history of coronary artery disease status post bypass, osteoarthritis who presents to the hospital with chest pain.  1.  Non-ST elevation MI- patient presented to the hospital with chest pain and has ruled in by cardiac markers.  -Patient's troponins have trended upwards from 0.03 to 7.29. -Patient is although chest pain-free and stable presently.  Continue aspirin, heparin drip, atorvastatin, carvedilol -Plan was for cardiac catheterization today but had to be postponed due to scheduling reasons and therefore to be done tomorrow.  2.  Essential hypertension-continue carvedilol.  3.  Hyperlipidemia-continue atorvastatin.  4.  Chronic kidney disease stage III-patient's creatinine is close to baseline.  Will need to follow renal function post cardiac catheterization.   All the records are reviewed and case discussed with Care Management/Social Worker. Management plans discussed with the patient, family and they are in agreement.  CODE STATUS: Full code  DVT Prophylaxis: Hep. gtt  TOTAL TIME TAKING CARE OF THIS PATIENT: 25 minutes.   POSSIBLE D/C IN 1-2 DAYS, DEPENDING ON CLINICAL CONDITION.   Henreitta Leber M.D on 06/02/2018 at 1:33 PM  Between 7am to 6pm - Pager - [phone]  After 6pm go to [email or web address] - password [email or web address]  Office  [phone]  CC: Primary care physician; [name], MD

## 2018-06-03 ENCOUNTER — Encounter
Admission: EM | Disposition: A | Discharge status: Home / Self Care | Payer: Self-pay | Source: Home / Self Care | Attending: Specialist

## 2018-06-03 ENCOUNTER — Encounter: Payer: Self-pay | Admitting: Cardiology

## 2018-06-03 HISTORY — PX: LEFT HEART CATH AND CORONARY ANGIOGRAPHY: CATH118249

## 2018-06-03 LAB — CBC
HCT: 38.3 % — ABNORMAL LOW (ref 40.0–52.0)
HEMOGLOBIN: 12.9 g/dL — AB (ref 13.0–18.0)
MCH: 32 pg (ref 26.0–34.0)
MCHC: 33.7 g/dL (ref 32.0–36.0)
MCV: 94.9 fL (ref 80.0–100.0)
PLATELETS: 150 10*3/uL (ref 150–440)
RBC: 4.03 MIL/uL — AB (ref 4.40–5.90)
RDW: 13.5 % (ref 11.5–14.5)
WBC: 8 10*3/uL (ref 3.8–10.6)

## 2018-06-03 LAB — HEPARIN LEVEL (UNFRACTIONATED): HEPARIN UNFRACTIONATED: 0.58 [IU]/mL (ref 0.30–0.70)

## 2018-06-03 SURGERY — LEFT HEART CATH
Anesthesia: Moderate Sedation

## 2018-06-03 SURGERY — LEFT HEART CATH AND CORONARY ANGIOGRAPHY
Anesthesia: Moderate Sedation

## 2018-06-03 MED ORDER — SODIUM CHLORIDE 0.9 % WEIGHT BASED INFUSION: 1.0000 mL/kg/h | INTRAVENOUS | Status: DC

## 2018-06-03 MED ORDER — LIDOCAINE HCL (PF) 1 % IJ SOLN
INTRAMUSCULAR | Status: AC
  Filled 2018-06-03: qty 30

## 2018-06-03 MED ORDER — MIDAZOLAM HCL 2 MG/2ML IJ SOLN
INTRAMUSCULAR | Status: AC
  Filled 2018-06-03: qty 2

## 2018-06-03 MED ORDER — SODIUM CHLORIDE 0.9 % IV SOLN: 250.0000 mL | INTRAVENOUS | Status: DC | PRN

## 2018-06-03 MED ORDER — HEPARIN (PORCINE) IN NACL 1000-0.9 UT/500ML-% IV SOLN
INTRAVENOUS | Status: AC
  Filled 2018-06-03: qty 1000

## 2018-06-03 MED ORDER — SODIUM CHLORIDE 0.9% FLUSH: 3.0000 mL | Freq: Two times a day (BID) | INTRAVENOUS | Status: DC

## 2018-06-03 MED ORDER — MIDAZOLAM HCL 2 MG/2ML IJ SOLN
INTRAMUSCULAR | Status: DC | PRN
  Administered 2018-06-03: 1 mg via INTRAVENOUS

## 2018-06-03 MED ORDER — CLOPIDOGREL BISULFATE 75 MG PO TABS: 75.0000 mg | ORAL_TABLET | Freq: Every day | ORAL | 1 refills | Status: AC

## 2018-06-03 MED ORDER — ONDANSETRON HCL 4 MG/2ML IJ SOLN: 4.0000 mg | Freq: Four times a day (QID) | INTRAMUSCULAR | Status: DC | PRN

## 2018-06-03 MED ORDER — CARVEDILOL 12.5 MG PO TABS: 12.5000 mg | ORAL_TABLET | Freq: Two times a day (BID) | ORAL | 1 refills | Status: DC

## 2018-06-03 MED ORDER — SODIUM CHLORIDE 0.9 % WEIGHT BASED INFUSION
1.0000 mL/kg/h | INTRAVENOUS | Status: DC
  Administered 2018-06-03: 1 mL/kg/h via INTRAVENOUS

## 2018-06-03 MED ORDER — SODIUM CHLORIDE 0.9 % WEIGHT BASED INFUSION
3.0000 mL/kg/h | INTRAVENOUS | Status: AC
  Administered 2018-06-03: 3 mL/kg/h via INTRAVENOUS

## 2018-06-03 MED ORDER — IOPAMIDOL (ISOVUE-300) INJECTION 61%
INTRAVENOUS | Status: DC | PRN
  Administered 2018-06-03: 135 mL via INTRA_ARTERIAL

## 2018-06-03 MED ORDER — ASPIRIN 81 MG PO CHEW
CHEWABLE_TABLET | ORAL | Status: AC
  Filled 2018-06-03: qty 1

## 2018-06-03 MED ORDER — CLOPIDOGREL BISULFATE 75 MG PO TABS
75.0000 mg | ORAL_TABLET | Freq: Every day | ORAL | Status: DC
  Administered 2018-06-03: 75 mg via ORAL
  Filled 2018-06-03: qty 1

## 2018-06-03 MED ORDER — SODIUM CHLORIDE 0.9% FLUSH: 3.0000 mL | INTRAVENOUS | Status: DC | PRN

## 2018-06-03 MED ORDER — OXYCODONE HCL 5 MG PO TABS
ORAL_TABLET | ORAL | Status: AC
  Filled 2018-06-03: qty 1

## 2018-06-03 MED ORDER — CARVEDILOL 12.5 MG PO TABS: 12.5000 mg | ORAL_TABLET | Freq: Two times a day (BID) | ORAL | Status: DC

## 2018-06-03 MED ORDER — FENTANYL CITRATE (PF) 100 MCG/2ML IJ SOLN
INTRAMUSCULAR | Status: AC
  Filled 2018-06-03: qty 2

## 2018-06-03 MED ORDER — FENTANYL CITRATE (PF) 100 MCG/2ML IJ SOLN
INTRAMUSCULAR | Status: DC | PRN
  Administered 2018-06-03: 25 ug via INTRAVENOUS

## 2018-06-03 MED ORDER — ISOSORBIDE MONONITRATE ER 30 MG PO TB24
30.0000 mg | ORAL_TABLET | Freq: Every day | ORAL | Status: DC
  Administered 2018-06-03: 30 mg via ORAL
  Filled 2018-06-03: qty 1

## 2018-06-03 MED ORDER — ASPIRIN 81 MG PO CHEW
81.0000 mg | CHEWABLE_TABLET | ORAL | Status: AC
  Administered 2018-06-03: 81 mg via ORAL

## 2018-06-03 MED ORDER — ISOSORBIDE MONONITRATE ER 30 MG PO TB24: 30.0000 mg | ORAL_TABLET | Freq: Every day | ORAL | 1 refills | Status: DC

## 2018-06-03 MED ORDER — ACETAMINOPHEN 325 MG PO TABS: 650.0000 mg | ORAL_TABLET | ORAL | Status: DC | PRN

## 2018-06-03 SURGICAL SUPPLY — 12 items
CATH INFINITI 5 FR IM (CATHETERS) ×3 IMPLANT
CATH INFINITI 5FR ANG PIGTAIL (CATHETERS) ×3 IMPLANT
CATH INFINITI 5FR JL4 (CATHETERS) ×3 IMPLANT
CATH INFINITI 5FR JL5 (CATHETERS) ×3 IMPLANT
CATH INFINITI JR4 5F (CATHETERS) ×3 IMPLANT
DEVICE CLOSURE MYNXGRIP 5F (Vascular Products) ×3 IMPLANT
KIT MANI 3VAL PERCEP (MISCELLANEOUS) ×3 IMPLANT
NEEDLE PERC 18GX7CM (NEEDLE) ×3 IMPLANT
PACK CARDIAC CATH (CUSTOM PROCEDURE TRAY) ×3 IMPLANT
SHEATH AVANTI 5FR X 11CM (SHEATH) ×3 IMPLANT
WIRE EMERALD 3MM-J .035X260CM (WIRE) ×3 IMPLANT
WIRE GUIDERIGHT .035X150 (WIRE) ×6 IMPLANT

## 2018-06-03 NOTE — Progress Notes (Signed)
ANTICOAGULATION CONSULT NOTE - Initial Consult  Pharmacy Consult for Heparin  Indication: chest pain/ACS  Allergies  Allergen Reactions  . Penicillins Rash and Other (See Comments)    Has patient had a PCN reaction causing immediate rash, facial/tongue/throat swelling, SOB or lightheadedness with hypotension: No Has patient had a PCN reaction causing severe rash involving mucus membranes or skin necrosis: No Has patient had a PCN reaction that required hospitalization: No Has patient had a PCN reaction occurring within the last 10 years: No If all of the above answers are "NO", then may proceed with Cephalosporin use.     Patient Measurements: Height: 5' 7"  (170.2 cm) Weight: 255 lb 12.8 oz (116 kg) IBW/kg (Calculated) : 66.1 Heparin Dosing Weight: 90 kg   Vital Signs: Temp: 98.4 F (36.9 C) (07/02 0334) Temp Source: Oral (07/02 0334) BP: 95/29 (07/02 0334) Pulse Rate: 53 (07/02 0334)  Labs: Recent Labs    05/31/18 2103 05/31/18 2113 06/01/18 0249 06/01/18 0514 06/01/18 0900 06/02/18 0425 06/03/18 0457  HGB 13.7  --  13.4  --   --   --   --   HCT 40.2  --  39.0*  --   --   --   --   PLT 170  --  155  --   --   --   --   APTT  --  34  --   --   --   --   --   LABPROT  --  13.5  --   --   --   --   --   INR  --  1.04  --   --   --   --   --   HEPARINUNFRC  --   --   --  0.56  --  0.59 0.58  CREATININE 1.51*  --  1.51*  --   --   --   --   TROPONINI 0.03*  --  4.12*  --  7.29*  --   --     Estimated Creatinine Clearance: 49.1 mL/min (A) (by C-G formula based on SCr of 1.51 mg/dL (H)).   Medical History: Past Medical History:  Diagnosis Date  . Arthritis   . CAD (coronary artery disease)   . CKD (chronic kidney disease), stage III ([place])   . Gout   . Hernia, abdominal   . Hypertension     Medications:  Facility-Administered Medications Prior to Admission  Medication Dose Route Frequency Provider Last Rate Last Dose  . triamcinolone acetonide (KENALOG) 10  MG/ML injection 10 mg  10 mg Other Once Landis Martins, DPM       Medications Prior to Admission  Medication Sig Dispense Refill Last Dose  . amLODipine-benazepril (LOTREL) 10-40 MG capsule Take 1 capsule by mouth daily.   0 05/31/2018 at Unknown time  . aspirin 81 MG tablet Take 81 mg daily by mouth.    05/31/2018 at Unknown time  . carvedilol (COREG) 3.125 MG tablet Take 3.125 mg by mouth 2 (two) times daily with a meal.   2 05/31/2018 at 1030  . celecoxib (CELEBREX) 100 MG capsule Take 100 mg by mouth daily.   0 05/31/2018 at Unknown time  . doxycycline (VIBRA-TABS) 100 MG tablet Take 1 tablet (100 mg total) by mouth 2 (two) times daily. 20 tablet 0 05/31/2018 at Unknown time  . gentamicin cream (GARAMYCIN) 0.1 % Apply 1 application topically 3 (three) times daily. 30 g 1 05/31/2018 at Unknown time  .  Multiple Vitamins-Minerals (MULTIVITAMIN ADULTS 50+ PO) Take 1 tablet daily by mouth.    05/31/2018 at Unknown time  . omega-3 acid ethyl esters (LOVAZA) 1 g capsule Take 1 g by mouth daily.    05/31/2018 at Unknown time  . predniSONE (DELTASONE) 5 MG tablet Take 1-6 tablets by mouth as directed. Taper tablets 6,5,4,3,2,1  0 05/31/2018 at Unknown time    Assessment: Pharmacy consulted for heparin drip dosing and monitoring for 77yo male admitted with ACS.   Goal of Therapy:  Heparin level 0.3-0.7 units/ml Monitor platelets by anticoagulation protocol: Yes   Plan:  6/30:  HL @ 0514 = 0.56 Will continue pt on current rate and recheck HL on 7/1 with AM labs.   7/1:  HL @ 0500 = 0.59 Will continue pt on current rate and recheck HL on 7/2 with AM labs.   07/02 @ 0500 HL 0.58 therapeutic. Will continue current rate and will recheck w/ am labs.  Tobie Lords, PharmD, BCPS Clinical Pharmacist 06/03/2018

## 2018-06-03 NOTE — Progress Notes (Signed)
Pt returns from cardiac cath. Right groin site dressing is clean, dry and intact. Pulses are positive bilaterally, VSS. I will continue to assess.

## 2018-06-03 NOTE — Discharge Summary (Signed)
[facility] NAME: Jeff Henderson    MR#:  [id number]  DATE OF BIRTH:  1941-04-30  DATE OF ADMISSION:  05/31/2018 ADMITTING PHYSICIAN: Lance Coon, MD  DATE OF DISCHARGE: 06/03/2018  PRIMARY CARE PHYSICIAN: [name], MD    ADMISSION DIAGNOSIS:  NSTEMI (non-ST elevated myocardial infarction) ([facility]) [I21.4]  DISCHARGE DIAGNOSIS:  Principal Problem:   Chest pain Active Problems:   Arteriosclerosis of coronary artery   HTN (hypertension)   CKD (chronic kidney disease), stage III ([facility])   NSTEMI (non-ST elevated myocardial infarction) ([place])   SECONDARY DIAGNOSIS:   Past Medical History:  Diagnosis Date  . Arthritis   . CAD (coronary artery disease)   . CKD (chronic kidney disease), stage III ([facility])   . Gout   . Hernia, abdominal   . Hypertension     HOSPITAL COURSE:   77 year old male with past medical history of hypertension, gout, chronic kidney disease stage III, history of coronary artery disease status post bypass, osteoarthritis who presents to the hospital with chest pain.  1.  Non-ST elevation MI- patient presented to the hospital with chest pain and had ruled in by cardiac markers.  -Patient's troponins have trended up from 0.03 to 7.29. -Patient was although chest pain-free and stable through hospital course. Pt. Was maintained on aspirin, heparin drip, atorvastatin, carvedilol -A cardiology consult was obtained.  Patient underwent cardiac catheterization on June 03, 2018.  Cardiac catheterization showed patent grafts from his previous bypass.  He had no significant disease that needed acute intervention.  Cardiology recommended medical management with aspirin, Plavix, Imdur and continuing his other cardiac meds with follow-up with them as an outpatient. Patient is presently hemodynamically stable and therefore being discharged home on the meds as stated below.  Cardiac Cath Results:  1.  Severe three-vessel  coronary artery disease with occluded mid LAD, occluded proximal RCA, occluded OM 2 2.  Patent LIMA to LAD 3.  Patent SVG to PDA 4.  Chronically occluded SVG to OM 2 5.  Mild to moderate his left ventricular function with LVEF 42%  Recommendations  1.  Medical therapy 2.  Add Plavix 3.  Uptitrate carvedilol 4.  Start isosorbide mononitrate 30 mg daily  2.  Essential hypertension- pt. Will continue carvedilol but dose was advanced as per Cardiology.  - Imdur was added as mentioned above.    3.  Hyperlipidemia- pt. Will continue atorvastatin.  4.  Chronic kidney disease stage III- Cr. At baseline and can be further followed as outpatient.    DISCHARGE CONDITIONS:   Stable  CONSULTS OBTAINED:  Treatment Team:  Teodoro Spray, MD Isaias Cowman, MD  DRUG ALLERGIES:   Allergies  Allergen Reactions  . Penicillins Rash and Other (See Comments)    Has patient had a PCN reaction causing immediate rash, facial/tongue/throat swelling, SOB or lightheadedness with hypotension: No Has patient had a PCN reaction causing severe rash involving mucus membranes or skin necrosis: No Has patient had a PCN reaction that required hospitalization: No Has patient had a PCN reaction occurring within the last 10 years: No If all of the above answers are "NO", then may proceed with Cephalosporin use.     DISCHARGE MEDICATIONS:   Allergies as of 06/03/2018      Reactions   Penicillins Rash, Other (See Comments)   Has patient had a PCN reaction causing immediate rash, facial/tongue/throat swelling, SOB or lightheadedness with hypotension: No Has patient had a PCN reaction  causing severe rash involving mucus membranes or skin necrosis: No Has patient had a PCN reaction that required hospitalization: No Has patient had a PCN reaction occurring within the last 10 years: No If all of the above answers are "NO", then may proceed with Cephalosporin use.      Medication List    TAKE  these medications   amLODipine-benazepril 10-40 MG capsule Commonly known as:  LOTREL Take 1 capsule by mouth daily.   aspirin 81 MG tablet Take 81 mg daily by mouth.   carvedilol 12.5 MG tablet Commonly known as:  COREG Take 1 tablet (12.5 mg total) by mouth 2 (two) times daily with a meal. What changed:    medication strength  how much to take   celecoxib 100 MG capsule Commonly known as:  CELEBREX Take 100 mg by mouth daily.   clopidogrel 75 MG tablet Commonly known as:  PLAVIX Take 1 tablet (75 mg total) by mouth daily.   doxycycline 100 MG tablet Commonly known as:  VIBRA-TABS Take 1 tablet (100 mg total) by mouth 2 (two) times daily.   gentamicin cream 0.1 % Commonly known as:  GARAMYCIN Apply 1 application topically 3 (three) times daily.   isosorbide mononitrate 30 MG 24 hr tablet Commonly known as:  IMDUR Take 1 tablet (30 mg total) by mouth daily.   MULTIVITAMIN ADULTS 50+ PO Take 1 tablet daily by mouth.   omega-3 acid ethyl esters 1 g capsule Commonly known as:  LOVAZA Take 1 g by mouth daily.   predniSONE 5 MG tablet Commonly known as:  DELTASONE Take 1-6 tablets by mouth as directed. Taper tablets 6,5,4,3,2,1         DISCHARGE INSTRUCTIONS:   DIET:  Cardiac diet  DISCHARGE CONDITION:  Stable  ACTIVITY:  Activity as tolerated  OXYGEN:  Home Oxygen: No.   Oxygen Delivery: room air  DISCHARGE LOCATION:  home   If you experience worsening of your admission symptoms, develop shortness of breath, life threatening emergency, suicidal or homicidal thoughts you must seek medical attention immediately by calling 911 or calling your MD immediately  if symptoms less severe.  You Must read complete instructions/literature along with all the possible adverse reactions/side effects for all the Medicines you take and that have been prescribed to you. Take any new Medicines after you have completely understood and accpet all the possible adverse  reactions/side effects.   Please note  You were cared for by a hospitalist during your hospital stay. If you have any questions about your discharge medications or the care you received while you were in the hospital after you are discharged, you can call the unit and asked to speak with the hospitalist on call if the hospitalist that took care of you is not available. Once you are discharged, your primary care physician will handle any further medical issues. Please note that NO REFILLS for any discharge medications will be authorized once you are discharged, as it is imperative that you return to your primary care physician (or establish a relationship with a primary care physician if you do not have one) for your aftercare needs so that they can reassess your need for medications and monitor your lab values.     Today   Currently chest pain-free and hemodynamically stable.  Status post cardiac catheterization this morning showing significant three-vessel disease but patent grafts.  Cardiology recommended medical management.  VITAL SIGNS:  Blood pressure (!) 154/74, pulse (!) 57, temperature (!) 97.5 F (36.4  C), temperature source Oral, resp. rate 14, height 5' 7"  (1.702 m), weight 116 kg (255 lb 12.8 oz), SpO2 95 %.  I/O:    Intake/Output Summary (Last 24 hours) at 06/03/2018 1528 Last data filed at 06/03/2018 1300 Gross per 24 hour  Intake 463.47 ml  Output 550 ml  Net -86.53 ml    PHYSICAL EXAMINATION:  GENERAL:  77 y.o.-year-old patient lying in the bed with no acute distress.  EYES: Pupils equal, round, reactive to light and accommodation. No scleral icterus. Extraocular muscles intact.  HEENT: Head atraumatic, normocephalic. Oropharynx and nasopharynx clear.  NECK:  Supple, no jugular venous distention. No thyroid enlargement, no tenderness.  LUNGS: Normal breath sounds bilaterally, no wheezing, rales,rhonchi. No use of accessory muscles of respiration.  CARDIOVASCULAR: S1, S2  normal. No murmurs, rubs, or gallops.  ABDOMEN: Soft, non-tender, non-distended. Bowel sounds present. No organomegaly or mass.  EXTREMITIES: No pedal edema, cyanosis, or clubbing.  NEUROLOGIC: Cranial nerves II through XII are intact. No focal motor or sensory defecits b/l.  PSYCHIATRIC: The patient is alert and oriented x 3.  SKIN: No obvious rash, lesion, or ulcer.   DATA REVIEW:   CBC Recent Labs  Lab 06/03/18 0458  WBC 8.0  HGB 12.9*  HCT 38.3*  PLT 150    Chemistries  Recent Labs  Lab 06/01/18 0249  NA 139  K 4.5  CL 111  CO2 21*  GLUCOSE 117*  BUN 35*  CREATININE 1.51*  CALCIUM 9.1    Cardiac Enzymes Recent Labs  Lab 06/01/18 0900  TROPONINI 7.29*    Microbiology Results  Results for orders placed or performed in visit on 05/27/18  WOUND CULTURE     Status: Abnormal   Collection Time: 05/27/18  2:25 PM  Result Value Ref Range Status   Gram Stain Result Final report  Final   Organism ID, Bacteria Comment  Final    Comment: Rare white blood cells.   Organism ID, Bacteria No organisms seen  Final   Aerobic Bacterial Culture Final report (A)  Final   Organism ID, Bacteria Comment (A)  Final    Comment: Staphylococcus epidermidis Scant growth Based on susceptibility to oxacillin this isolate would be susceptible to: *Penicillinase-stable penicillins, such as:   Cloxacillin, Dicloxacillin, Nafcillin *Beta-lactam combination agents, such as:   Amoxicillin-clavulanic acid, Ampicillin-sulbactam,   Piperacillin-tazobactam *Oral cephems, such as:   Cefaclor, Cefdinir, Cefpodoxime, Cefprozil, Cefuroxime,   Cephalexin, Loracarbef *Parenteral cephems, such as:   Cefazolin, Cefepime, Cefotaxime, Cefotetan, Ceftaroline,   Ceftizoxime, Ceftriaxone, Cefuroxime *Carbapenems, such as:   Doripenem, Ertapenem, Imipenem, Meropenem    Antimicrobial Susceptibility Comment  Final    Comment:       ** S = Susceptible; I = Intermediate; R = Resistant **                     P = Positive; N = Negative             MICS are expressed in micrograms per mL    Antibiotic                 RSLT#1    RSLT#2    RSLT#3    RSLT#4 Ciprofloxacin                  S Clindamycin                    S Erythromycin  S Gentamicin                     S Levofloxacin                   S Linezolid                      S Oxacillin                      S Penicillin                     R Quinupristin/Dalfopristin      S Rifampin                       S Tetracycline                   S Trimethoprim/Sulfa             S Vancomycin                     S     RADIOLOGY:  No results found.    Management plans discussed with the patient, family and they are in agreement.  CODE STATUS:     Code Status Orders  (From admission, onward)        Start     Ordered   06/03/18 0856  Full code  Continuous     06/03/18 0855     TOTAL TIME TAKING CARE OF THIS PATIENT: 40 minutes.    Henreitta Leber M.D on 06/03/2018 at 3:28 PM  Between 7am to 6pm - Pager - [phone]  After 6pm go to [email or web address] - password [facility]  Office  [phone]  CC: Primary care physician; [name], MD

## 2018-06-13 ENCOUNTER — Ambulatory Visit: Payer: PRIVATE HEALTH INSURANCE | Admitting: Podiatry

## 2018-06-17 ENCOUNTER — Encounter: Payer: Self-pay | Admitting: Podiatry

## 2018-06-17 ENCOUNTER — Ambulatory Visit (INDEPENDENT_AMBULATORY_CARE_PROVIDER_SITE_OTHER): Payer: Medicare Other | Admitting: Podiatry

## 2018-06-17 DIAGNOSIS — M1A9XX1 Chronic gout, unspecified, with tophus (tophi): Secondary | ICD-10-CM

## 2018-06-17 DIAGNOSIS — L02612 Cutaneous abscess of left foot: Secondary | ICD-10-CM | POA: Diagnosis not present

## 2018-06-26 NOTE — Progress Notes (Signed)
   HPI: 77 year old male with a history of gout presents today for follow up evaluation of an ulceration of the left sub-fifth MPJ. He states he is doing well. He has been able to ambulate without issue. He denies any pain or any new concerns at this time. Patient is here for further evaluation and treatment.   Past Medical History:  Diagnosis Date  . Arthritis   . CAD (coronary artery disease)   . CKD (chronic kidney disease), stage III ([facility])   . Gout   . Hernia, abdominal   . Hypertension       Physical Exam: General: The patient is alert and oriented x3 in no acute distress.  Dermatology: Ulcer noted to the plantar aspect of the fifth MPJ left foot measuring approximately 0.3 cm x 0.3 cm x 0.1 cm. Periwound erythema noted. No malodor noted. There is some chalky white drainage noted likely consistent with gouty tophi. Periwound integrity is intact. There is no exposed bone muscle tendon ligament or joint.  Vascular: Palpable pedal pulses bilaterally. No edema or erythema noted. Capillary refill within normal limits.  Neurological: Epicritic and protective threshold grossly intact bilaterally.   Musculoskeletal Exam: Range of motion within normal limits to all pedal and ankle joints bilateral. Muscle strength 5/5 in all groups bilateral. Pain on palpation along the fifth MTPJ left foot.  Assessment: 1. 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 Patient evaluated.  2.  Incision and drainage of the abscess lesion in the gouty tophi was performed using a combination of a tissue nipper and blunt dissection. All tophaceous drainage was expressed from the area and cultures were taken and sent to pathology. 3. Continue using antibiotic cream daily with a bandage.  4. Resume wearing good shoe gear.  5. Return to clinic as needed.      Edrick Kins, DPM [facility]  Dr. Edrick Kins, DPM    [address]  [place], [place] [address]                Office [phone]  Fax [phone]

## 2018-07-16 ENCOUNTER — Other Ambulatory Visit: Payer: Self-pay

## 2018-07-16 ENCOUNTER — Inpatient Hospital Stay: Payer: Medicare Other | Attending: Oncology

## 2018-07-16 DIAGNOSIS — N183 Chronic kidney disease, stage 3 unspecified: Secondary | ICD-10-CM

## 2018-07-16 DIAGNOSIS — Z79899 Other long term (current) drug therapy: Secondary | ICD-10-CM | POA: Insufficient documentation

## 2018-07-16 DIAGNOSIS — R809 Proteinuria, unspecified: Secondary | ICD-10-CM | POA: Insufficient documentation

## 2018-07-16 DIAGNOSIS — I129 Hypertensive chronic kidney disease with stage 1 through stage 4 chronic kidney disease, or unspecified chronic kidney disease: Secondary | ICD-10-CM | POA: Diagnosis not present

## 2018-07-16 DIAGNOSIS — R768 Other specified abnormal immunological findings in serum: Secondary | ICD-10-CM | POA: Diagnosis not present

## 2018-07-16 LAB — COMPREHENSIVE METABOLIC PANEL
ALK PHOS: 42 U/L (ref 38–126)
ALT: 24 U/L (ref 0–44)
AST: 27 U/L (ref 15–41)
Albumin: 4.1 g/dL (ref 3.5–5.0)
Anion gap: 12 (ref 5–15)
BUN: 40 mg/dL — ABNORMAL HIGH (ref 8–23)
CALCIUM: 9 mg/dL (ref 8.9–10.3)
CO2: 21 mmol/L — ABNORMAL LOW (ref 22–32)
CREATININE: 1.73 mg/dL — AB (ref 0.61–1.24)
Chloride: 108 mmol/L (ref 98–111)
GFR, EST AFRICAN AMERICAN: 42 mL/min — AB (ref >60)
GFR, EST NON AFRICAN AMERICAN: 36 mL/min — AB (ref >60)
Glucose, Bld: 105 mg/dL — ABNORMAL HIGH (ref 70–99)
Potassium: 4.7 mmol/L (ref 3.5–5.1)
Sodium: 141 mmol/L (ref 135–145)
Total Bilirubin: 0.8 mg/dL (ref 0.3–1.2)
Total Protein: 7 g/dL (ref 6.5–8.1)

## 2018-07-16 LAB — CBC WITH DIFFERENTIAL/PLATELET
BASOS PCT: 1 %
Basophils Absolute: 0.1 10*3/uL (ref 0–0.1)
EOS ABS: 0.3 10*3/uL (ref 0–0.7)
Eosinophils Relative: 5 %
HCT: 38.3 % — ABNORMAL LOW (ref 40.0–52.0)
HEMOGLOBIN: 12.9 g/dL — AB (ref 13.0–18.0)
Lymphocytes Relative: 13 %
Lymphs Abs: 0.9 10*3/uL — ABNORMAL LOW (ref 1.0–3.6)
MCH: 31.6 pg (ref 26.0–34.0)
MCHC: 33.8 g/dL (ref 32.0–36.0)
MCV: 93.5 fL (ref 80.0–100.0)
Monocytes Absolute: 0.6 10*3/uL (ref 0.2–1.0)
Monocytes Relative: 9 %
NEUTROS PCT: 72 %
Neutro Abs: 5.1 10*3/uL (ref 1.4–6.5)
Platelets: 143 10*3/uL — ABNORMAL LOW (ref 150–440)
RBC: 4.1 MIL/uL — AB (ref 4.40–5.90)
RDW: 13 % (ref 11.5–14.5)
WBC: 7 10*3/uL (ref 3.8–10.6)

## 2018-07-17 LAB — KAPPA/LAMBDA LIGHT CHAINS
Kappa free light chain: 36.1 mg/L — ABNORMAL HIGH (ref 3.3–19.4)
Kappa, lambda light chain ratio: 1.85 — ABNORMAL HIGH (ref 0.26–1.65)
Lambda free light chains: 19.5 mg/L (ref 5.7–26.3)

## 2018-07-18 LAB — MULTIPLE MYELOMA PANEL, SERUM
ALBUMIN SERPL ELPH-MCNC: 3.5 g/dL (ref 2.9–4.4)
ALBUMIN/GLOB SERPL: 1.4 (ref 0.7–1.7)
Alpha 1: 0.2 g/dL (ref 0.0–0.4)
Alpha2 Glob SerPl Elph-Mcnc: 0.7 g/dL (ref 0.4–1.0)
B-GLOBULIN SERPL ELPH-MCNC: 0.9 g/dL (ref 0.7–1.3)
GAMMA GLOB SERPL ELPH-MCNC: 0.7 g/dL (ref 0.4–1.8)
Globulin, Total: 2.6 g/dL (ref 2.2–3.9)
IGA: 292 mg/dL (ref 61–437)
IgG (Immunoglobin G), Serum: 809 mg/dL (ref 700–1600)
IgM (Immunoglobulin M), Srm: 87 mg/dL (ref 15–143)
Total Protein ELP: 6.1 g/dL (ref 6.0–8.5)

## 2018-09-09 ENCOUNTER — Ambulatory Visit (INDEPENDENT_AMBULATORY_CARE_PROVIDER_SITE_OTHER): Payer: Medicare Other

## 2018-09-09 ENCOUNTER — Encounter: Payer: Self-pay | Admitting: Podiatry

## 2018-09-09 ENCOUNTER — Ambulatory Visit (INDEPENDENT_AMBULATORY_CARE_PROVIDER_SITE_OTHER): Payer: Medicare Other | Admitting: Podiatry

## 2018-09-09 DIAGNOSIS — M7661 Achilles tendinitis, right leg: Secondary | ICD-10-CM | POA: Diagnosis not present

## 2018-09-09 NOTE — Progress Notes (Signed)
   HPI: 77 year old male presents the office today for new complaint regarding a painful knot to the posterior aspect of the right Achilles tendon.  Patient states that approximately 1 to 2 weeks ago he noticed a painful knot on the back of his Achilles.  Is very swollen at the time however the swelling has improved significantly.  Patient currently states that the pain is 3/10.  Patient denies trauma or injury.  He has been soaking his foot in warm water and Epsom salt and taking diclofenac.  Past Medical History:  Diagnosis Date  . Arthritis   . CAD (coronary artery disease)   . CKD (chronic kidney disease), stage III ([facility])   . Gout   . Hernia, abdominal   . Hypertension      Physical Exam: General: The patient is alert and oriented x3 in no acute distress.  Dermatology: Skin is warm, dry and supple bilateral lower extremities. Negative for open lesions or macerations.  Vascular: Palpable pedal pulses bilaterally. No edema or erythema noted. Capillary refill within normal limits.  Neurological: Epicritic and protective threshold grossly intact bilaterally.   Musculoskeletal Exam: Range of motion within normal limits to all pedal and ankle joints bilateral. Muscle strength 5/5 in all groups bilateral.  There is a non-adhered palpable nodule noted just proximal to the insertion of the Achilles tendon onto the calcaneus.  Nodule measures approximately 1.5 cm in diameter.  Radiographic Exam:  Normal osseous mineralization.  Joint space narrowing with degenerative joint disease throughout the joints of the foot.  Intratendinous calcifications noted to the Achilles tendon right lower extremity best visualized on lateral view.  Significant changes since prior exam taken in 2016.  Large calcific portion of the calcaneus appears to be somewhat detached as well as smaller portions of the calcific tendon.  This may demonstrate partial tear with detachment from the insertion of the Achilles tendon  onto the calcaneus.  Assessment: 1. Achilles tendinitis right - significantly improved over the past week 2. Calcific achilles RLE   Plan of Care:  1. Patient evaluated. X-Rays reviewed.  2.  Since there is significant improvement over the past week we will continue with observation and conservative modalities.  Pain is also minimal. 3.  Silicone toe sleeve dispensed today 4.  Recommend good supportive athletic sneakers 5.  Return to clinic as needed      Edrick Kins, DPM [facility]  Dr. Edrick Kins, DPM    [address], [place] [address]                Office [phone]  Fax [phone]

## 2018-10-20 ENCOUNTER — Inpatient Hospital Stay: Payer: Medicare Other | Attending: Oncology | Admitting: Oncology

## 2018-10-20 ENCOUNTER — Encounter: Payer: Self-pay | Admitting: Oncology

## 2018-10-20 ENCOUNTER — Inpatient Hospital Stay: Payer: Medicare Other

## 2018-10-20 VITALS — BP 126/56 | HR 51 | Temp 97.2°F | Resp 18 | Ht 67.0 in | Wt 257.9 lb

## 2018-10-20 DIAGNOSIS — N183 Chronic kidney disease, stage 3 unspecified: Secondary | ICD-10-CM

## 2018-10-20 DIAGNOSIS — Z7982 Long term (current) use of aspirin: Secondary | ICD-10-CM | POA: Diagnosis not present

## 2018-10-20 DIAGNOSIS — R768 Other specified abnormal immunological findings in serum: Secondary | ICD-10-CM | POA: Insufficient documentation

## 2018-10-20 DIAGNOSIS — I252 Old myocardial infarction: Secondary | ICD-10-CM | POA: Diagnosis not present

## 2018-10-20 DIAGNOSIS — Z79899 Other long term (current) drug therapy: Secondary | ICD-10-CM | POA: Insufficient documentation

## 2018-10-20 DIAGNOSIS — I10 Essential (primary) hypertension: Secondary | ICD-10-CM | POA: Diagnosis not present

## 2018-10-20 DIAGNOSIS — N400 Enlarged prostate without lower urinary tract symptoms: Secondary | ICD-10-CM | POA: Diagnosis not present

## 2018-10-20 LAB — COMPREHENSIVE METABOLIC PANEL
ALBUMIN: 4.1 g/dL (ref 3.5–5.0)
ALT: 16 U/L (ref 0–44)
AST: 23 U/L (ref 15–41)
Alkaline Phosphatase: 43 U/L (ref 38–126)
Anion gap: 9 (ref 5–15)
BUN: 41 mg/dL — ABNORMAL HIGH (ref 8–23)
CHLORIDE: 109 mmol/L (ref 98–111)
CO2: 25 mmol/L (ref 22–32)
CREATININE: 1.85 mg/dL — AB (ref 0.61–1.24)
Calcium: 8.9 mg/dL (ref 8.9–10.3)
GFR calc non Af Amer: 33 mL/min — ABNORMAL LOW (ref >60)
GFR, EST AFRICAN AMERICAN: 39 mL/min — AB (ref >60)
GLUCOSE: 119 mg/dL — AB (ref 70–99)
Potassium: 4.4 mmol/L (ref 3.5–5.1)
SODIUM: 143 mmol/L (ref 135–145)
Total Bilirubin: 0.8 mg/dL (ref 0.3–1.2)
Total Protein: 7.2 g/dL (ref 6.5–8.1)

## 2018-10-20 LAB — CBC WITH DIFFERENTIAL/PLATELET
Abs Immature Granulocytes: 0.02 10*3/uL (ref 0.00–0.07)
Basophils Absolute: 0 10*3/uL (ref 0.0–0.1)
Basophils Relative: 1 %
Eosinophils Absolute: 0.5 10*3/uL (ref 0.0–0.5)
Eosinophils Relative: 7 %
HEMATOCRIT: 38.4 % — AB (ref 39.0–52.0)
HEMOGLOBIN: 12.4 g/dL — AB (ref 13.0–17.0)
IMMATURE GRANULOCYTES: 0 %
LYMPHS ABS: 1.1 10*3/uL (ref 0.7–4.0)
LYMPHS PCT: 16 %
MCH: 31 pg (ref 26.0–34.0)
MCHC: 32.3 g/dL (ref 30.0–36.0)
MCV: 96 fL (ref 80.0–100.0)
Monocytes Absolute: 0.6 10*3/uL (ref 0.1–1.0)
Monocytes Relative: 9 %
NEUTROS PCT: 67 %
NRBC: 0 % (ref 0.0–0.2)
Neutro Abs: 4.7 10*3/uL (ref 1.7–7.7)
Platelets: 153 10*3/uL (ref 150–400)
RBC: 4 MIL/uL — ABNORMAL LOW (ref 4.22–5.81)
RDW: 13.1 % (ref 11.5–15.5)
WBC: 6.9 10*3/uL (ref 4.0–10.5)

## 2018-10-20 NOTE — Progress Notes (Signed)
Hematology/Oncology Consult note [facility]  Telephone:([phone] [phone]  Patient Care Team: [name], MD as PCP - General (Family Medicine)   Name of the patient: [facility]  [id number]  May 17, 1941   Date of visit: 10/20/18  Diagnosis- elevated free light chains due to CKD  Chief complaint/ Reason for visit-routine follow-up of elevated serum free light chains  Heme/Onc history: Patientis a 77 year old Caucasian male with past medical occlusion of the artery disease, BPH stage III chronic kidney disease other medical problems. He was evaluated by [facility] for elevated kappa light chain. At that time it was thought that the elevated kappa light chain possibly as a result of chronic kidney disease rather than the cause.Last visit with Dr. Amalia Hailey was inJuly 2014.Patient also had a fat pad biopsy in 2014 did not reveal any evidence of malignancy or amyloidosis.Patient had a bone marrow biopsy which revealed a normocellular bone 40% with trilineage hematopoiesis. 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 Skeletal survey back then did not reveal any lytic lesions. Patient was also seen by Dr. Dewaine Oats from nephrology because of chronic kidney disease was thought to be due to NSAIDS.  Patient's most recent blood work was as follows: Serum Light Chains Were Elevated at 3.89 with a Free Light Chain Ratio Was Normal at 1.61.Serum immunofixation revealed no monoclonal protein. SPEP revealed asymmetric peakin the gamma region. 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 Monoclonal protein was not detected.Recent CBC from 09/03/2017 revealed a white count of 7.5, H&H of 14/42.5 and a platelet count of 202.BUN/creatinine was 30/1.8 has remained stable  Patient served for 4  years in the [employer] and continues to be fit and active. Denies any pain today. No unintentional weight loss  Results of blood work from 09/30/2017 were as follows: CBC showed white count of 7.3, H&H of 13.9/41.5 with a platelet count of 193. CMP was significant for elevated BUN and creatinine of 37/1.69 respectively which is chronic and stable. Multiple myeloma panel did not reveal any monoclonal protein on SPEP or immunofixation. Serum free light chains revealed elevated Of 41.7 and elevated lambda of 28.8 with a normal ratio of 1.45  Interval history- he is doing well. Denies any specific complaints today  ECOG PS- 1 Pain scale- 0 Opioid associated constipation- 0  Review of systems- Review of Systems  Constitutional: Negative for chills, fever, malaise/fatigue and weight loss.  HENT: Negative for congestion, ear discharge and nosebleeds.   Eyes: Negative for blurred vision.  Respiratory: Negative for cough, hemoptysis, sputum production, shortness of breath and wheezing.   Cardiovascular: Negative for chest pain, palpitations, orthopnea and claudication.  Gastrointestinal: Negative for abdominal pain, blood in stool, constipation, diarrhea, heartburn, melena, nausea and vomiting.  Genitourinary: Negative for dysuria, flank pain, frequency, hematuria and urgency.  Musculoskeletal: Negative for back pain, joint pain and myalgias.  Skin: Negative for rash.  Neurological: Negative for dizziness, tingling, focal weakness, seizures, weakness and headaches.  Endo/Heme/Allergies: Does not bruise/bleed easily.  Psychiatric/Behavioral: Negative for depression and suicidal ideas. The patient does not have insomnia.       Allergies  Allergen Reactions  . Penicillins Rash and Other (See Comments)    Has patient had a PCN reaction causing immediate rash, facial/tongue/throat swelling, SOB or lightheadedness with hypotension: No Has patient had a PCN reaction causing severe rash involving  mucus membranes or skin necrosis: No  Has patient had a PCN reaction that required hospitalization: No Has patient had a PCN reaction occurring within the last 10 years: No If all of the above answers are "NO", then may proceed with Cephalosporin use.      Past Medical History:  Diagnosis Date  . Arthritis   . CAD (coronary artery disease)   . CKD (chronic kidney disease), stage III ([place])   . Gout   . Hernia, abdominal   . Hypertension   . Myocardial infarction [facility])      Past Surgical History:  Procedure Laterality Date  . carpel tunnel    . LEFT HEART CATH AND CORONARY ANGIOGRAPHY N/A 06/03/2018   Procedure: LEFT HEART CATH AND CORS/GRAFTS ANGIOGRAPHY;  Surgeon: Isaias Cowman, MD;  Location: [place] [facility];  Service: Cardiovascular;  Laterality: N/A;  . REPLACEMENT TOTAL KNEE    . VEIN BYPASS SURGERY      Social History   Socioeconomic History  . Marital status: Unknown    Spouse name: Not on file  . Number of children: Not on file  . Years of education: Not on file  . Highest education level: Not on file  Occupational History  . Not on file  Social Needs  . Financial resource strain: Not on file  . Food insecurity:    Worry: Not on file    Inability: Not on file  . Transportation needs:    Medical: Not on file    Non-medical: Not on file  Tobacco Use  . Smoking status: Never Smoker  . Smokeless tobacco: Never Used  Substance and Sexual Activity  . Alcohol use: No    Alcohol/week: 0.0 standard drinks  . Drug use: No  . Sexual activity: Yes  Lifestyle  . Physical activity:    Days per week: Not on file    Minutes per session: Not on file  . Stress: Not on file  Relationships  . Social connections:    Talks on phone: Not on file    Gets together: Not on file    Attends religious service: Not on file    Active member of club or organization: Not on file    Attends meetings of clubs or organizations: Not on file    Relationship status: Not on  file  . Intimate partner violence:    Fear of current or ex partner: Not on file    Emotionally abused: Not on file    Physically abused: Not on file    Forced sexual activity: Not on file  Other Topics Concern  . Not on file  Social History Narrative  . Not on file    Family History  Problem Relation Age of Onset  . Asthma Mother   . Diabetes Father   . Hypertension Father      Current Outpatient Medications:  .  amLODipine-benazepril (LOTREL) 10-40 MG capsule, Take 1 capsule by mouth daily. , Disp: , Rfl: 0 .  aspirin 81 MG tablet, Take 81 mg daily by mouth. , Disp: , Rfl:  .  atorvastatin (LIPITOR) 20 MG tablet, Take 20 mg by mouth daily., Disp: , Rfl:  .  atorvastatin (LIPITOR) 40 MG tablet, , Disp: , Rfl: 11 .  celecoxib (CELEBREX) 100 MG capsule, Take 100 mg by mouth daily. , Disp: , Rfl: 0 .  clopidogrel (PLAVIX) 75 MG tablet, , Disp: , Rfl:  .  colchicine 0.6 MG tablet, Take 2 tablets (1.60m) by mouth at first sign of gout flare followed  by 1 tablet (0.59m) after 1 hour. (Max 1.831mwithin 1 hour), Disp: , Rfl:  .  gentamicin cream (GARAMYCIN) 0.1 %, Apply 1 application topically 3 (three) times daily., Disp: 30 g, Rfl: 1 .  isosorbide mononitrate (IMDUR) 30 MG 24 hr tablet, Take 1 tablet (30 mg total) by mouth daily., Disp: 30 tablet, Rfl: 1 .  Multiple Vitamin (MULTI-VITAMINS) TABS, Take by mouth., Disp: , Rfl:  .  Multiple Vitamins-Minerals (MULTIVITAMIN ADULTS 50+ PO), Take 1 tablet daily by mouth. , Disp: , Rfl:  .  omega-3 acid ethyl esters (LOVAZA) 1 g capsule, Take 1 g by mouth daily. , Disp: , Rfl:  .  carvedilol (COREG) 12.5 MG tablet, Take 1 tablet (12.5 mg total) by mouth 2 (two) times daily with a meal., Disp: 60 tablet, Rfl: 1 .  doxycycline (VIBRA-TABS) 100 MG tablet, Take 1 tablet (100 mg total) by mouth 2 (two) times daily. (Patient not taking: Reported on 10/20/2018), Disp: 20 tablet, Rfl: 0 .  predniSONE (DELTASONE) 5 MG tablet, Take 1-6 tablets by mouth  as directed. Taper tablets 6,5,4,3,2,1, Disp: , Rfl: 0  Current Facility-Administered Medications:  .  triamcinolone acetonide (KENALOG) 10 MG/ML injection 10 mg, 10 mg, Other, Once, StLandis MartinsDPM  Physical exam:  Vitals:   10/20/18 0906  BP: (!) 126/56  Pulse: (!) 51  Resp: 18  Temp: (!) 97.2 F (36.2 C)  TempSrc: Tympanic  SpO2: 96%  Weight: 257 lb 15 oz (117 kg)  Height: 5' 7"  (1.702 m)   Physical Exam  Constitutional: He is oriented to person, place, and time.  Early obese male in no acute distress.  HENT:  Head: Normocephalic and atraumatic.  Eyes: Pupils are equal, round, and reactive to light. EOM are normal.  Neck: Normal range of motion.  Cardiovascular: Normal rate, regular rhythm and normal heart sounds.  Pulmonary/Chest: Effort normal and breath sounds normal.  Abdominal: Soft. Bowel sounds are normal.  Neurological: He is alert and oriented to person, place, and time.  Skin: Skin is warm and dry.     CMP Latest Ref Rng & Units 10/20/2018  Glucose 70 - 99 mg/dL 119(H)  BUN 8 - 23 mg/dL 41(H)  Creatinine 0.61 - 1.24 mg/dL 1.85(H)  Sodium 135 - 145 mmol/L 143  Potassium 3.5 - 5.1 mmol/L 4.4  Chloride 98 - 111 mmol/L 109  CO2 22 - 32 mmol/L 25  Calcium 8.9 - 10.3 mg/dL 8.9  Total Protein 6.5 - 8.1 g/dL 7.2  Total Bilirubin 0.3 - 1.2 mg/dL 0.8  Alkaline Phos 38 - 126 U/L 43  AST 15 - 41 U/L 23  ALT 0 - 44 U/L 16   CBC Latest Ref Rng & Units 10/20/2018  WBC 4.0 - 10.5 K/uL 6.9  Hemoglobin 13.0 - 17.0 g/dL 12.4(L)  Hematocrit 39.0 - 52.0 % 38.4(L)  Platelets 150 - 400 K/uL 153     Assessment and plan- Patient is a [age].o. male with elevated free light chains likely secondary to CKD  Patient's hemoglobin is stable between 12-13.  No evidence of hypercalcemia.  Myeloma panel shows no M protein based on the labs from October 2019 and serum free light chains has been stable between 1.82 with mildly elevated serum kappa light chain probably secondary  to CKD.  Myeloma panel and serum free light chain ratio from today is pending.  His kidney functions back [place] and today's higher at 1.8.  He was admitted in July for non-STEMI and underwent cardiac  catheterization as well.  I have encouraged him to follow-up with nephrology for his kidney functions.  He will return back in 6 months time for CBC, CMP, myeloma panel and serum free light chains and see Dr. Mike Gip at that time.  I do not see a reason to do a bone marrow biopsy at this time since he has had bone marrow biopsy in the past which did not show any evidence of myeloma.   Visit Diagnosis 1. Elevated serum immunoglobulin free light chain level      Dr. Randa Evens, MD, MPH [facility] [phone] 10/20/2018 11:57 AM

## 2018-10-21 LAB — KAPPA/LAMBDA LIGHT CHAINS
Kappa free light chain: 37.6 mg/L — ABNORMAL HIGH (ref 3.3–19.4)
Kappa, lambda light chain ratio: 1.75 — ABNORMAL HIGH (ref 0.26–1.65)
LAMDA FREE LIGHT CHAINS: 21.5 mg/L (ref 5.7–26.3)

## 2018-10-22 LAB — MULTIPLE MYELOMA PANEL, SERUM
ALBUMIN/GLOB SERPL: 1.5 (ref 0.7–1.7)
ALPHA 1: 0.2 g/dL (ref 0.0–0.4)
ALPHA2 GLOB SERPL ELPH-MCNC: 0.8 g/dL (ref 0.4–1.0)
Albumin SerPl Elph-Mcnc: 3.8 g/dL (ref 2.9–4.4)
B-Globulin SerPl Elph-Mcnc: 1 g/dL (ref 0.7–1.3)
GAMMA GLOB SERPL ELPH-MCNC: 0.8 g/dL (ref 0.4–1.8)
Globulin, Total: 2.7 g/dL (ref 2.2–3.9)
IGG (IMMUNOGLOBIN G), SERUM: 813 mg/dL (ref 700–1600)
IgA: 291 mg/dL (ref 61–437)
IgM (Immunoglobulin M), Srm: 84 mg/dL (ref 15–143)
Total Protein ELP: 6.5 g/dL (ref 6.0–8.5)

## 2018-12-11 NOTE — Progress Notes (Signed)
Switched to Dr. Fletcher Anon

## 2018-12-12 ENCOUNTER — Ambulatory Visit (INDEPENDENT_AMBULATORY_CARE_PROVIDER_SITE_OTHER): Payer: Medicare Other | Admitting: Cardiovascular Disease

## 2018-12-12 VITALS — BP 150/66 | HR 56 | Ht 69.0 in | Wt 261.0 lb

## 2018-12-12 DIAGNOSIS — I25708 Atherosclerosis of coronary artery bypass graft(s), unspecified, with other forms of angina pectoris: Secondary | ICD-10-CM

## 2018-12-12 DIAGNOSIS — I771 Stricture of artery: Secondary | ICD-10-CM

## 2018-12-12 DIAGNOSIS — I10 Essential (primary) hypertension: Secondary | ICD-10-CM

## 2018-12-12 DIAGNOSIS — E785 Hyperlipidemia, unspecified: Secondary | ICD-10-CM | POA: Diagnosis not present

## 2018-12-12 MED ORDER — ISOSORBIDE MONONITRATE ER 60 MG PO TB24: 60.0000 mg | ORAL_TABLET | Freq: Every day | ORAL | 1 refills | Status: DC

## 2018-12-12 NOTE — Patient Instructions (Signed)
Medication Instructions:  INCREASE the Imdur to 60 mg daily  If you need a refill on your cardiac medications before your next appointment, please call your pharmacy.   Lab work: None ordered  Testing/Procedures: Your physician has requested that you have a renal artery duplex. During this test, an ultrasound is used to evaluate blood flow to the kidneys. Take your medications as you usually do. This will take place at [place] [address], the [facility] office   No food after 11PM the night before.  Water is OK. (Don't drink liquids if you have been instructed not to for ANOTHER test).  Take two Extra-Strength Gas-X capsules at bedtime the night before test.   Take an additional two Extra-Strength Gas-X capsules three (3) hours before the test or first thing in the morning.    Avoid foods that produce bowel gas, for 24 hours prior to exam (see below).    No breakfast, no chewing gum, no smoking or carbonated beverages.  Patient may take morning medications with water.  Come in for test at least 15 minutes early to register.  Your physician has requested that you have a carotid duplex. This test is an ultrasound of the carotid arteries in your neck. It looks at blood flow through these arteries that supply the brain with blood. Allow one hour for this exam. There are no restrictions or special instructions. This will take place at [place] [address], the [facility] office.   Follow-Up: At [facility], you and your health needs are our priority.  As part of our continuing mission to provide you with exceptional heart care, we have created designated Provider Care Teams.  These Care Teams include your primary Cardiologist (physician) and Advanced Practice Providers (APPs -  Physician Assistants and Nurse Practitioners) who all work together to provide you with the care you need, when you need it. You will need a follow up appointment in 3 months. You may see Dr.  Fletcher Anon or one of the following Advanced Practice Providers on your designated Care Team:   Murray Hodgkins, NP Christell Faith, PA-C

## 2018-12-12 NOTE — Progress Notes (Signed)
Cardiology Office Note   Date:  12/12/2018   ID:  Jeff Henderson, DOB [birth year], MRN [id number]  PCP:  [name], MD  Cardiologist:   [name], MD   Chief Complaint  Patient presents with  . New Patient (Initial Visit)    Second opinion has seen [name]. Patient c/o chest pain when in a hurry/excited and Swelling in legs. patient states when he is stressed the chest pain moves to shoulders. Medications reviewed verbally.       History of Present Illness: Jeff Henderson is a 78 y.o. male who presents for a second opinion regarding management of coronary artery disease. He has a history of coronary artery disease status post CABG (1995), left subclavian stenosis (reportedly up to 70% during cardiac catheterization at [facility] (2011), hypertension, hyperlipidemia, chronic kidney disease stage III, gout, and BPH.  He was admitted to [facility] in late 05/2018 with chest pain and positive troponin (uptrending, 7.3 on last check).  Catheterization by Dr. Saralyn Pilar (angiograms personally reviewed), showed severe three-vessel coronary artery disease including chronic total occlusions of the proximal RCA and mid LAD.  Distal LCx and grafted OM also appear chronically occluded.  There was a 99% ostial LAD stenosis, supplying 2 small diagonal branches.  LIMA to LAD was patent as well as SVG to RCA.  There was suggestion of stenosis in the proximal left subclavian artery, though it was not well visualized.  No intervention was performed at that time.  LVEF was mildly reduced at 45 to 50% on echo during that admission.    He is known to have left subclavian artery stenosis that there was evaluated by an angiogram at [facility] in 2011.  It showed that the stenosis was 40% at that time and a tortuous segment.  No intervention was performed.  He denies left arm claudication.  The patient has chronic kidney disease with difficult to control blood pressure.  Reviewing his previous nephrology records  does not show work-up for renal artery stenosis.  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  He reports mild chest pain and shortness of breath with overexertion that has somewhat limited his activities.  He reports being very active at a younger age.  He takes his medications regularly.  He is not a smoker.  Past Medical History:  Diagnosis Date  . Arthritis   . CAD (coronary artery disease)   . CKD (chronic kidney disease), stage III ([place])   . Gout   . Hernia, abdominal   . Hypertension   . Myocardial infarction [facility])     Past Surgical History:  Procedure Laterality Date  . carpel tunnel    . LEFT HEART CATH AND CORONARY ANGIOGRAPHY N/A 06/03/2018   Procedure: LEFT HEART CATH AND CORS/GRAFTS ANGIOGRAPHY;  Surgeon: Isaias Cowman, MD;  Location: [place] [facility];  Service: Cardiovascular;  Laterality: N/A;  . REPLACEMENT TOTAL KNEE    . VEIN BYPASS SURGERY       Current Outpatient Medications  Medication Sig Dispense Refill  . amLODipine-benazepril (LOTREL) 10-40 MG capsule Take 1 capsule by mouth daily.   0  . aspirin 81 MG tablet Take 81 mg daily by mouth.     Marland Kitchen atorvastatin (LIPITOR) 80 MG tablet Take 80 mg by mouth daily.     . carvedilol (COREG) 6.25 MG tablet Take 6.25 mg by mouth 2 (two) times daily with a meal.    . clopidogrel (PLAVIX) 75 MG tablet     . omega-3 acid ethyl esters (  LOVAZA) 1 g capsule Take 1 g by mouth daily.     . isosorbide mononitrate (IMDUR) 60 MG 24 hr tablet Take 1 tablet (60 mg total) by mouth daily. 90 tablet 1   Current Facility-Administered Medications  Medication Dose Route Frequency Provider Last Rate Last Dose  . triamcinolone acetonide (KENALOG) 10 MG/ML injection 10 mg  10 mg Other Once Jeff Henderson, DPM        Allergies:   Penicillins    Social History:  The patient  reports that he has never smoked. He has never used smokeless tobacco. He reports that he does not drink alcohol or use drugs.   Family History:  The patient's family history  includes Asthma in his mother; Diabetes in his father; Hypertension in his father.    ROS:  Please see the history of present illness.   Otherwise, review of systems are positive for none.   All other systems are reviewed and negative.    PHYSICAL EXAM: VS:  BP (!) 150/66 (BP Location: Left Arm, Patient Position: Sitting, Cuff Size: Large)   Pulse (!) 56   Ht 5' 9"  (1.753 m)   Wt 261 lb (118.4 kg)   BMI 38.54 kg/m  , BMI Body mass index is 38.54 kg/m. GEN: Well nourished, well developed, in no acute distress  HEENT: normal  Neck: no JVD, carotid bruits, or masses Cardiac: RRR; no murmurs, rubs, or gallops,no edema  Respiratory:  clear to auscultation bilaterally, normal work of breathing GI: soft, nontender, nondistended, + BS MS: no deformity or atrophy  Skin: warm and dry, no rash Neuro:  Strength and sensation are intact Psych: euthymic mood, full affect Radial pulse is slightly diminished bilaterally.  EKG:  EKG is ordered today. The ekg ordered today demonstrates sinus bradycardia with old inferior infarct.  No significant ST or T wave changes.   Recent Labs: 10/20/2018: ALT 16; BUN 41; Creatinine, Ser 1.85; Hemoglobin 12.4; Platelets 153; Potassium 4.4; Sodium 143    Lipid Panel No results found for: CHOL, TRIG, HDL, CHOLHDL, VLDL, LDLCALC, LDLDIRECT    Wt Readings from Last 3 Encounters:  12/12/18 261 lb (118.4 kg)  10/20/18 257 lb 15 oz (117 kg)  06/01/18 255 lb 12.8 oz (116 kg)        PAD Screen 12/12/2018  Previous PAD dx? No  Previous surgical procedure? No  Pain with walking? No  Feet/toe relief with dangling? No  Painful, non-healing ulcers? No  Extremities discolored? No      ASSESSMENT AND PLAN:  1.  Coronary artery disease involving bypass graft with stable angina: The patient has symptoms suggestive of class II and occasionally 3 angina in spite of 2 antianginal medications. I reviewed cardiac catheterization with him today.  The only  potential areas for PCI would be ostial LAD to improve the flow to first and second diagonal and over left subclavian artery if the stenosis is deemed to be significant.  The 2 diagonal branches appear to be medium in size overall and I do not think he will get great benefit from stenting the ostial LAD just for those.  The risks probably outweigh the benefits. I elected to increase his Imdur to 60 mg daily.  Ranexa is an option down the road.  2.  Left subclavian artery stenosis: His left radial pulse is slightly diminished but he does not have left arm claudication.  If this is physiologically significant, it could be affecting the flow down the LIMA and causing angina  but I doubt that given that his left vertebral artery appeared normal with antegrade flow.  I am going to obtain carotid Doppler with focus on the left subclavian artery to check for physiologic significance.  3.  Essential hypertension: Difficult to control blood pressure with underlying chronic kidney disease.  I do think we have to exclude significant renal artery stenosis.  Thus, I requested renal artery duplex.  4.  Hyperlipidemia: I agree with atorvastatin with a target LDL is less than 70.    Disposition:   FU with me in 3 months  Signed,  [name], MD  12/12/2018 2:23 PM    [facility]

## 2018-12-16 ENCOUNTER — Other Ambulatory Visit: Payer: Self-pay | Admitting: *Deleted

## 2018-12-16 ENCOUNTER — Telehealth: Payer: Self-pay | Admitting: Cardiovascular Disease

## 2018-12-16 MED ORDER — ISOSORBIDE MONONITRATE ER 60 MG PO TB24: 60.0000 mg | ORAL_TABLET | Freq: Every day | ORAL | 3 refills | Status: DC

## 2018-12-16 MED ORDER — ISOSORBIDE MONONITRATE ER 60 MG PO TB24: 60.0000 mg | ORAL_TABLET | Freq: Every day | ORAL | 1 refills | Status: DC

## 2018-12-16 NOTE — Telephone Encounter (Signed)
Requested Prescriptions   Signed Prescriptions Disp Refills  . isosorbide mononitrate (IMDUR) 60 MG 24 hr tablet 30 tablet 3    Sig: Take 1 tablet (60 mg total) by mouth daily.    Authorizing Provider: [name] A    Ordering User: Britt Bottom

## 2018-12-16 NOTE — Telephone Encounter (Signed)
°*  STAT* If patient is at the pharmacy, call can be transferred to refill team.   1. Which medications need to be refilled? (please list name of each medication and dose if known)  Isosorbide mononitrate (IMDUR) 60 MG 1 tablet daily  2. Which pharmacy/location (including street and city if local pharmacy) is medication to be sent to? [facility] in [place]   3. Do they need a 30 day or 90 day supply? 30 day   Patient states that it was to be sent on 1/10 when he saw Dr Fletcher Anon but [facility] states they do not have prescription.  Please resend.

## 2018-12-16 NOTE — Telephone Encounter (Signed)
Requested Prescriptions   Signed Prescriptions Disp Refills  . isosorbide mononitrate (IMDUR) 60 MG 24 hr tablet 90 tablet 1    Sig: Take 1 tablet (60 mg total) by mouth daily.    Authorizing Provider: [name] A    Ordering User: Britt Bottom

## 2018-12-18 ENCOUNTER — Ambulatory Visit: Payer: Medicare Other | Admitting: Cardiovascular Disease

## 2019-02-16 ENCOUNTER — Ambulatory Visit (INDEPENDENT_AMBULATORY_CARE_PROVIDER_SITE_OTHER): Payer: Medicare Other

## 2019-02-16 ENCOUNTER — Other Ambulatory Visit: Payer: Self-pay

## 2019-02-16 DIAGNOSIS — I771 Stricture of artery: Secondary | ICD-10-CM

## 2019-02-16 DIAGNOSIS — I10 Essential (primary) hypertension: Secondary | ICD-10-CM | POA: Diagnosis not present

## 2019-02-19 ENCOUNTER — Telehealth: Payer: Self-pay | Admitting: *Deleted

## 2019-02-19 DIAGNOSIS — I739 Peripheral vascular disease, unspecified: Principal | ICD-10-CM

## 2019-02-19 DIAGNOSIS — I779 Disorder of arteries and arterioles, unspecified: Secondary | ICD-10-CM

## 2019-02-19 NOTE — Telephone Encounter (Signed)
-----   Message from Wellington Hampshire, MD sent at 02/19/2019 11:19 AM EDT ----- Inform patient that carotid Doppler showed moderate bilateral disease.  We should repeat study in 1 year. There was no significant stenosis affecting the left subclavian artery.

## 2019-02-19 NOTE — Telephone Encounter (Signed)
Patient made aware of results and verbalized understanding. Repeat orders placed.

## 2019-03-03 ENCOUNTER — Telehealth: Payer: Self-pay

## 2019-03-03 NOTE — Telephone Encounter (Signed)
Left a message on patient's answering machine to call our office regarding a change in his visit for 03/24/2019.  Left on machine in detail the reason for the call is to see if he would be willing to do the telephone e-visit or webex visit on 03/04/19 with Dr. Fletcher Anon.

## 2019-03-03 NOTE — Telephone Encounter (Signed)
Telephone Consent obtained verbally.   YOUR CARDIOLOGY TEAM HAS ARRANGED FOR AN E-VISIT FOR YOUR APPOINTMENT - PLEASE REVIEW IMPORTANT INFORMATION BELOW SEVERAL DAYS PRIOR TO YOUR APPOINTMENT  Due to the recent COVID-19 pandemic, we are transitioning in-person office visits to tele-medicine visits in an effort to decrease unnecessary exposure to our patients and staff. Medicare and most insurances are covering these visits without a copay needed. We also encourage you to sign up for MyChart if you have not already done so. You will need a smartphone if possible. For patients that do not have this, we can still complete the visit using a regular telephone but do prefer a smartphone to enable video when possible. You may have a close family member that lives with you that can help. If possible, we also ask that you have a blood pressure cuff and scale at home to measure your blood pressure, heart rate and weight prior to your scheduled appointment. Patients with clinical needs that need an in-person evaluation and testing will still be able to come to the office if absolutely necessary. If you have any questions, feel free to call our office.    IF YOU HAVE A SMARTPHONE, PLEASE DOWNLOAD THE WEBEX APP TO YOUR SMARTPHONE  - If Apple, go to [employer] and type in WebEx in the search bar. [place] [employer], the blue/green circle. The app is free but as with any other app download, your phone may require you to verify saved payment information or Apple password. You do NOT have to create a WebEx account.  - If Android, go to [employer] and type in [employer] in the search bar. [place] [employer], the blue/green circle. The app is free but as with any other app download, your phone may require you to verify saved payment information or Android password. You do NOT have to create a WebEx account.  It is very helpful to have this downloaded before your visit.    2-3 DAYS BEFORE YOUR  APPOINTMENT  You will receive a telephone call from one of our [place] team members - your caller ID may say "Unknown caller." If this is a video visit, we will confirm that you have been able to download the WebEx app. We will remind you check your blood pressure, heart rate and weight prior to your scheduled appointment. If you have an Apple Watch or Kardia, please upload any pertinent ECG strips the day before or morning of your appointment to [place]. Our staff will also make sure you have reviewed the consent and agree to move forward with your scheduled tele-health visit.     THE DAY OF YOUR APPOINTMENT  Approximately 15 minutes prior to your scheduled appointment, you will receive a telephone call from one of [place] team - your caller ID may say "Unknown caller."  Our staff will confirm medications, vital signs for the day and any symptoms you may be experiencing. Please have this information available prior to the time of visit start. It may also be helpful for you to have a pad of paper and pen handy for any instructions given during your visit. They will also walk you through joining the WebEx smartphone meeting if this is a video visit.    CONSENT FOR TELE-HEALTH VISIT - PLEASE REVIEW  I hereby voluntarily request, consent and authorize [facility] and its employed or contracted physicians, physician assistants, nurse practitioners or other licensed health care professionals (the Practitioner), to provide me with telemedicine  health care services (the "Services") as deemed necessary by the treating Practitioner. I acknowledge and consent to receive the Services by the Practitioner via telemedicine. I understand that the telemedicine visit will involve communicating with the Practitioner through live audiovisual communication technology and the disclosure of certain medical information by electronic transmission. I acknowledge that I have been given the opportunity to request an  in-person assessment or other available alternative prior to the telemedicine visit and am voluntarily participating in the telemedicine visit.  I understand that I have the right to withhold or withdraw my consent to the use of telemedicine in the course of my care at any time, without affecting my right to future care or treatment, and that the Practitioner or I may terminate the telemedicine visit at any time. I understand that I have the right to inspect all information obtained and/or recorded in the course of the telemedicine visit and may receive copies of available information for a reasonable fee.  I understand that some of the potential risks of receiving the Services via telemedicine include:  Marland Kitchen Delay or interruption in medical evaluation due to technological equipment failure or disruption; . Information transmitted may not be sufficient (e.g. poor resolution of images) to allow for appropriate medical decision making by the Practitioner; and/or  . In rare instances, security protocols could fail, causing a breach of personal health information.  Furthermore, I acknowledge that it is my responsibility to provide information about my medical history, conditions and care that is complete and accurate to the best of my ability. I acknowledge that Practitioner's advice, recommendations, and/or decision may be based on factors not within their control, such as incomplete or inaccurate data provided by me or distortions of diagnostic images or specimens that may result from electronic transmissions. I understand that the practice of medicine is not an exact science and that Practitioner makes no warranties or guarantees regarding treatment outcomes. I acknowledge that I will receive a copy of this consent concurrently upon execution via email to the email address I last provided but may also request a printed copy by calling the office of [facility].    I understand that my insurance will be  billed for this visit.   I have read or had this consent read to me. . I understand the contents of this consent, which adequately explains the benefits and risks of the Services being provided via telemedicine.  . I have been provided ample opportunity to ask questions regarding this consent and the Services and have had my questions answered to my satisfaction. . I give my informed consent for the services to be provided through the use of telemedicine in my medical care  By participating in this telemedicine visit I agree to the above. Yes

## 2019-03-04 ENCOUNTER — Other Ambulatory Visit: Payer: Self-pay

## 2019-03-04 ENCOUNTER — Telehealth (INDEPENDENT_AMBULATORY_CARE_PROVIDER_SITE_OTHER): Payer: Medicare Other | Admitting: Cardiovascular Disease

## 2019-03-04 ENCOUNTER — Encounter: Payer: Self-pay | Admitting: Cardiovascular Disease

## 2019-03-04 VITALS — HR 64 | Ht 69.0 in | Wt 241.0 lb

## 2019-03-04 DIAGNOSIS — I10 Essential (primary) hypertension: Secondary | ICD-10-CM

## 2019-03-04 DIAGNOSIS — I25118 Atherosclerotic heart disease of native coronary artery with other forms of angina pectoris: Secondary | ICD-10-CM | POA: Diagnosis not present

## 2019-03-04 DIAGNOSIS — E785 Hyperlipidemia, unspecified: Secondary | ICD-10-CM

## 2019-03-04 NOTE — Patient Instructions (Signed)
Medication Instructions:  Continue same medications  If you need a refill on your cardiac medications before your next appointment, please call your pharmacy.   Lab work: None If you have labs (blood work) drawn today and your tests are completely normal, you will receive your results only by: Marland Kitchen MyChart Message (if you have MyChart) OR . A paper copy in the mail If you have any lab test that is abnormal or we need to change your treatment, we will call you to review the results.  Testing/Procedures: None  Follow-Up: At [facility], you and your health needs are our priority.  As part of our continuing mission to provide you with exceptional heart care, we have created designated Provider Care Teams.  These Care Teams include your primary Cardiologist (physician) and Advanced Practice Providers (APPs -  Physician Assistants and Nurse Practitioners) who all work together to provide you with the care you need, when you need it. You will need a follow up appointment in 6 months.  Please call our office 2 months in advance to schedule this appointment.  You may see No primary care provider on file. or one of the following Advanced Practice Providers on your designated Care Team:   Murray Hodgkins, NP Christell Faith, PA-C . Marrianne Mood, PA-C  Any Other Special Instructions Will Be Listed Below (If Applicable).

## 2019-03-04 NOTE — Progress Notes (Signed)
Virtual Visit via Telephone Note    Evaluation Performed:  Follow-up visit  This visit type was conducted due to national recommendations for restrictions regarding the COVID-19 Pandemic (e.g. social distancing).  This format is felt to be most appropriate for this patient at this time.  All issues noted in this document were discussed and addressed.  No physical exam was performed (except for noted visual exam findings with Video Visits).  Please refer to the patient's chart (MyChart message for video visits and phone note for telephone visits) for the patient's consent to telehealth for [facility].  Date:  03/04/2019   ID:  Jeff Henderson, DOB February 28, 1941, MRN [id number]  Patient Location:    Provider location:   [facility]  PCP:  [name], MD  Cardiologist:   Dr. Fletcher Anon Electrophysiologist:  None   Chief Complaint: Follow-up visit  History of Present Illness:    Jeff Henderson is a 78 y.o. male who presents via audio/video conferencing for a telehealth visit today.  This is a follow-up visit.  He has a history of coronary artery disease status post CABG (1995), left subclavian stenosis (reportedly up to 70% during cardiac catheterization at [facility] (2011), hypertension, hyperlipidemia, chronic kidney disease stage III, gout, and BPH.  He was admitted to [facility] in late 05/2018 with chest pain and positive troponin (uptrending, 7.3 on last check).  Catheterization by Dr. Saralyn Pilar (angiograms personally reviewed), showed severe three-vessel coronary artery disease including chronic total occlusions of the proximal RCA and mid LAD.  Distal LCx and grafted OM also appear chronically occluded.  There was a 99% ostial LAD stenosis, supplying 2 small diagonal branches.  LIMA to LAD was patent as well as SVG to RCA.  There was suggestion of stenosis in the proximal left subclavian artery, though it was not well visualized.   LVEF was mildly reduced at 45 to 50% on echo during  that admission.    He is known to have left subclavian artery stenosis that there was evaluated by an angiogram at [facility] in 2011.  It showed that the stenosis was 40% at that time and a tortuous segment.  No intervention was performed.  He denies left arm claudication.  The patient has chronic kidney disease with difficult to control blood pressure.  Reviewing his previous nephrology records does not show work-up for renal artery stenosis.  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  During last visit, I increased Imdur to 60 mg once daily.  I proceeded with carotid Doppler which showed moderate bilateral carotid disease with no evidence of significant stenosis affecting the left subclavian artery.  Renal artery duplex showed no renal artery stenosis.  He has been doing well and reports no chest pain or shortness of breath.  He is taking his medications regularly.  The patient does not have symptoms concerning for COVID-19 infection (fever, chills, cough, or new shortness of breath).    Prior CV studies:   The following studies were reviewed today:  Reviewed results of carotid Doppler and renal artery duplex with him.  Past Medical History:  Diagnosis Date   Arthritis    CAD (coronary artery disease)    CKD (chronic kidney disease), stage III (HCC)    Gout    Hernia, abdominal    Hypertension    Myocardial infarction [facility])    Past Surgical History:  Procedure Laterality Date   carpel tunnel     LEFT HEART CATH AND CORONARY ANGIOGRAPHY N/A 06/03/2018   Procedure:  LEFT HEART CATH AND CORS/GRAFTS ANGIOGRAPHY;  Surgeon: Isaias Cowman, MD;  Location: [place] [facility];  Service: Cardiovascular;  Laterality: N/A;   REPLACEMENT TOTAL KNEE     VEIN BYPASS SURGERY       Current Meds  Medication Sig   amLODipine-benazepril (LOTREL) 10-40 MG capsule Take 1 capsule by mouth daily.    aspirin 81 MG tablet Take 81 mg daily by mouth.    atorvastatin (LIPITOR) 80 MG tablet Take 80 mg by mouth  daily.    carvedilol (COREG) 6.25 MG tablet Take 6.25 mg by mouth 2 (two) times daily with a meal.   clopidogrel (PLAVIX) 75 MG tablet    Coenzyme Q10 (COQ10) 100 MG CAPS Take 100 mg by mouth daily.   isosorbide mononitrate (IMDUR) 60 MG 24 hr tablet Take 1 tablet (60 mg total) by mouth daily.   Multiple Vitamins-Minerals (MENS 50+ MULTI VITAMIN/MIN) TABS Take by mouth daily.   omega-3 acid ethyl esters (LOVAZA) 1 g capsule Take 1 g by mouth daily.    Current Facility-Administered Medications for the 03/04/19 encounter (Telemedicine) with Wellington Hampshire, MD  Medication   triamcinolone acetonide (KENALOG) 10 MG/ML injection 10 mg     Allergies:   Penicillins   Social History   Tobacco Use   Smoking status: Never Smoker   Smokeless tobacco: Never Used  Substance Use Topics   Alcohol use: No    Alcohol/week: 0.0 standard drinks   Drug use: No     Family Hx: The patient's family history includes Asthma in his mother; Diabetes in his father; Hypertension in his father.  ROS:   Please see the history of present illness.     All other systems reviewed and are negative.   Labs/Other Tests and Data Reviewed:    Recent Labs: 10/20/2018: ALT 16; BUN 41; Creatinine, Ser 1.85; Hemoglobin 12.4; Platelets 153; Potassium 4.4; Sodium 143   Recent Lipid Panel No results found for: CHOL, TRIG, HDL, CHOLHDL, LDLCALC, LDLDIRECT  Wt Readings from Last 3 Encounters:  03/04/19 241 lb (109.3 kg)  12/12/18 261 lb (118.4 kg)  10/20/18 257 lb 15 oz (117 kg)     Objective:    Vital Signs:  Pulse 64    Ht 5' 9"  (1.753 m)    Wt 241 lb (109.3 kg)    BMI 35.59 kg/m      ASSESSMENT & PLAN:    1.  Coronary artery disease involving native coronary arteries with stable angina.  He currently reports no symptoms on current antianginal medications.  Continue same medications for now.  Ranexa can be considered in the future if needed.  2.  Moderate bilateral carotid disease: Repeat  study in 1 year.  There was no evidence of significant left subclavian artery stenosis.  3.  Essential hypertension: He reports good readings at home.  Renal artery duplex showed no renal artery stenosis.  4.  Hyperlipidemia: Continue treatment with atorvastatin with a target LDL of less than 70.  COVID-19 Education: The signs and symptoms of COVID-19 were discussed with the patient and how to seek care for testing (follow up with PCP or arrange E-visit).  The importance of social distancing was discussed today.  Patient Risk:   After full review of this patient's clinical status, I feel that they are at least moderate risk at this time.  Time:   Today, I have spent 20 minutes with the patient with telehealth technology discussing above issues.     Medication Adjustments/Labs  and Tests Ordered: Current medicines are reviewed at length with the patient today.  Concerns regarding medicines are outlined above.  Tests Ordered: No orders of the defined types were placed in this encounter.  Medication Changes: No orders of the defined types were placed in this encounter.   Disposition:  Follow up in 6 month(s)  Signed, [name], MD  03/04/2019 11:31 AM    [place]

## 2019-03-04 NOTE — Progress Notes (Signed)
Recall entered .  Patient to call office.  AVS printed to sabrina for mailing.

## 2019-03-24 ENCOUNTER — Ambulatory Visit: Payer: PRIVATE HEALTH INSURANCE | Admitting: Cardiovascular Disease

## 2019-04-22 ENCOUNTER — Other Ambulatory Visit: Payer: Medicare Other

## 2019-04-22 ENCOUNTER — Ambulatory Visit: Payer: Medicare Other | Admitting: Hematology and Oncology

## 2019-04-26 IMAGING — DX DG CHEST 1V PORT
1 series · 1 of 1 positions shown · non-contrast
Comparison: None.

CLINICAL DATA: Chest pain and shortness of breath.

EXAM:
PORTABLE CHEST 1 VIEW

[chest ap]
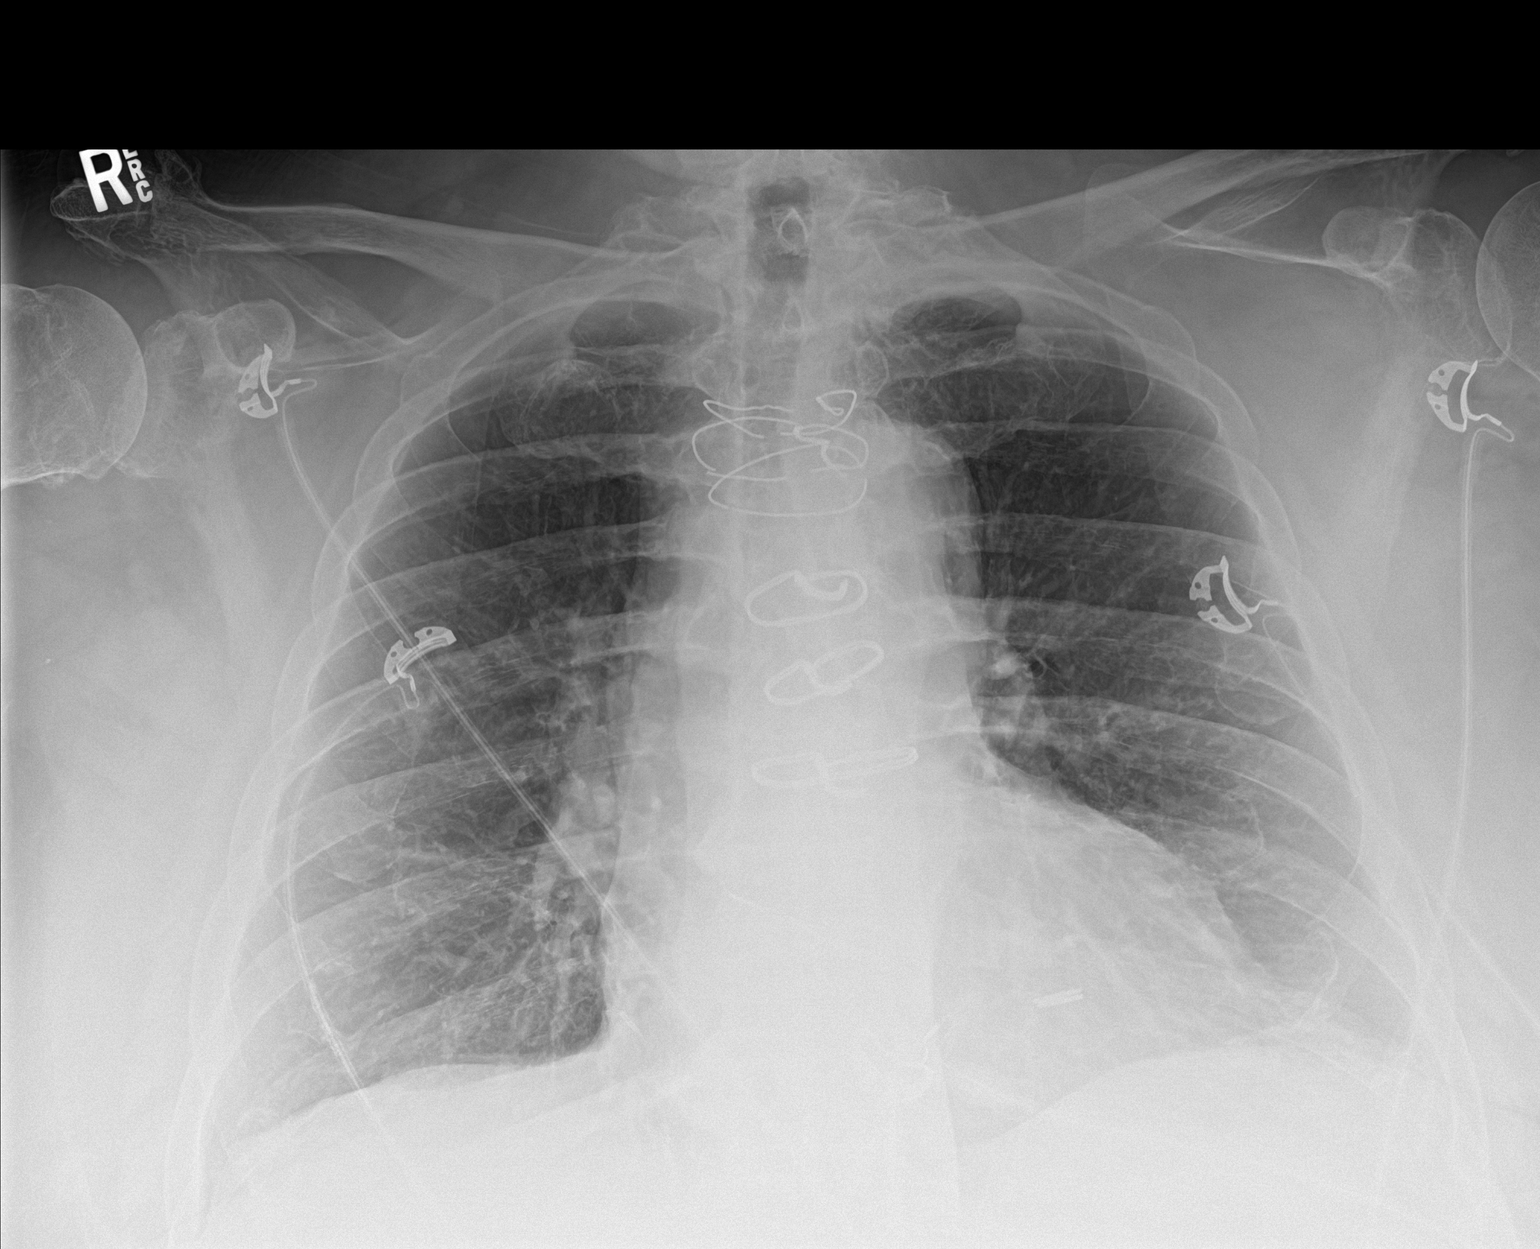

[1 of 1 positions shown; findings below may reference images not displayed]

FINDINGS: The heart size and mediastinal contours are within normal limits.
Normal pulmonary vascularity. No focal consolidation, pleural
effusion, or pneumothorax. Prior median sternotomy with fractures of
the three superior most sternal wires. No acute osseous abnormality.
IMPRESSION: No active disease.

## 2019-05-06 ENCOUNTER — Inpatient Hospital Stay
Admission: EM | Admit: 2019-05-06 | Discharge: 2019-05-08 | DRG: 281 | Disposition: A | Discharge status: Home / Self Care | Payer: Medicare Other | Attending: Internal Medicine | Admitting: Internal Medicine

## 2019-05-06 ENCOUNTER — Emergency Department: Payer: Medicare Other

## 2019-05-06 ENCOUNTER — Other Ambulatory Visit: Payer: Self-pay

## 2019-05-06 DIAGNOSIS — I25110 Atherosclerotic heart disease of native coronary artery with unstable angina pectoris: Secondary | ICD-10-CM | POA: Diagnosis present

## 2019-05-06 DIAGNOSIS — I200 Unstable angina: Secondary | ICD-10-CM

## 2019-05-06 DIAGNOSIS — I130 Hypertensive heart and chronic kidney disease with heart failure and stage 1 through stage 4 chronic kidney disease, or unspecified chronic kidney disease: Secondary | ICD-10-CM | POA: Diagnosis present

## 2019-05-06 DIAGNOSIS — I25810 Atherosclerosis of coronary artery bypass graft(s) without angina pectoris: Secondary | ICD-10-CM | POA: Diagnosis present

## 2019-05-06 DIAGNOSIS — M109 Gout, unspecified: Secondary | ICD-10-CM | POA: Diagnosis present

## 2019-05-06 DIAGNOSIS — Z825 Family history of asthma and other chronic lower respiratory diseases: Secondary | ICD-10-CM

## 2019-05-06 DIAGNOSIS — Z1159 Encounter for screening for other viral diseases: Secondary | ICD-10-CM

## 2019-05-06 DIAGNOSIS — I5042 Chronic combined systolic (congestive) and diastolic (congestive) heart failure: Secondary | ICD-10-CM | POA: Diagnosis present

## 2019-05-06 DIAGNOSIS — E785 Hyperlipidemia, unspecified: Secondary | ICD-10-CM | POA: Diagnosis present

## 2019-05-06 DIAGNOSIS — I214 Non-ST elevation (NSTEMI) myocardial infarction: Secondary | ICD-10-CM | POA: Diagnosis not present

## 2019-05-06 DIAGNOSIS — I4891 Unspecified atrial fibrillation: Secondary | ICD-10-CM | POA: Diagnosis present

## 2019-05-06 DIAGNOSIS — I252 Old myocardial infarction: Secondary | ICD-10-CM

## 2019-05-06 DIAGNOSIS — Z8673 Personal history of transient ischemic attack (TIA), and cerebral infarction without residual deficits: Secondary | ICD-10-CM

## 2019-05-06 DIAGNOSIS — Z7901 Long term (current) use of anticoagulants: Secondary | ICD-10-CM

## 2019-05-06 DIAGNOSIS — D472 Monoclonal gammopathy: Secondary | ICD-10-CM | POA: Diagnosis present

## 2019-05-06 DIAGNOSIS — N183 Chronic kidney disease, stage 3 (moderate): Secondary | ICD-10-CM | POA: Diagnosis present

## 2019-05-06 DIAGNOSIS — Z833 Family history of diabetes mellitus: Secondary | ICD-10-CM

## 2019-05-06 DIAGNOSIS — Z79899 Other long term (current) drug therapy: Secondary | ICD-10-CM

## 2019-05-06 DIAGNOSIS — Z7951 Long term (current) use of inhaled steroids: Secondary | ICD-10-CM

## 2019-05-06 DIAGNOSIS — Z8249 Family history of ischemic heart disease and other diseases of the circulatory system: Secondary | ICD-10-CM

## 2019-05-06 DIAGNOSIS — Z66 Do not resuscitate: Secondary | ICD-10-CM | POA: Diagnosis present

## 2019-05-06 DIAGNOSIS — I455 Other specified heart block: Secondary | ICD-10-CM

## 2019-05-06 DIAGNOSIS — Z96659 Presence of unspecified artificial knee joint: Secondary | ICD-10-CM | POA: Diagnosis present

## 2019-05-06 LAB — CBC
HCT: 36.1 % — ABNORMAL LOW (ref 39.0–52.0)
Hemoglobin: 12 g/dL — ABNORMAL LOW (ref 13.0–17.0)
MCH: 31.9 pg (ref 26.0–34.0)
MCHC: 33.2 g/dL (ref 30.0–36.0)
MCV: 96 fL (ref 80.0–100.0)
Platelets: 178 10*3/uL (ref 150–400)
RBC: 3.76 MIL/uL — ABNORMAL LOW (ref 4.22–5.81)
RDW: 13 % (ref 11.5–15.5)
WBC: 11.3 10*3/uL — ABNORMAL HIGH (ref 4.0–10.5)
nRBC: 0 % (ref 0.0–0.2)

## 2019-05-06 LAB — TROPONIN I: Troponin I: 0.14 ng/mL (ref <0.03)

## 2019-05-06 LAB — COMPREHENSIVE METABOLIC PANEL
ALT: 19 U/L (ref 0–44)
AST: 28 U/L (ref 15–41)
Albumin: 3.8 g/dL (ref 3.5–5.0)
Alkaline Phosphatase: 41 U/L (ref 38–126)
Anion gap: 8 (ref 5–15)
BUN: 31 mg/dL — ABNORMAL HIGH (ref 8–23)
CO2: 24 mmol/L (ref 22–32)
Calcium: 9 mg/dL (ref 8.9–10.3)
Chloride: 108 mmol/L (ref 98–111)
Creatinine, Ser: 1.56 mg/dL — ABNORMAL HIGH (ref 0.61–1.24)
GFR calc Af Amer: 48 mL/min — ABNORMAL LOW (ref >60)
GFR calc non Af Amer: 42 mL/min — ABNORMAL LOW (ref >60)
Glucose, Bld: 142 mg/dL — ABNORMAL HIGH (ref 70–99)
Potassium: 4.7 mmol/L (ref 3.5–5.1)
Sodium: 140 mmol/L (ref 135–145)
Total Bilirubin: 1 mg/dL (ref 0.3–1.2)
Total Protein: 6.9 g/dL (ref 6.5–8.1)

## 2019-05-06 LAB — PROTIME-INR
INR: 1 (ref 0.8–1.2)
Prothrombin Time: 13.5 seconds (ref 11.4–15.2)

## 2019-05-06 LAB — SARS CORONAVIRUS 2 BY RT PCR (HOSPITAL ORDER, PERFORMED IN ~~LOC~~ HOSPITAL LAB): SARS Coronavirus 2: NEGATIVE

## 2019-05-06 LAB — HEPARIN LEVEL (UNFRACTIONATED): Heparin Unfractionated: 0.51 IU/mL (ref 0.30–0.70)

## 2019-05-06 LAB — APTT: aPTT: 34 seconds (ref 24–36)

## 2019-05-06 MED ORDER — SODIUM CHLORIDE 0.9% FLUSH
3.0000 mL | Freq: Two times a day (BID) | INTRAVENOUS | Status: DC
  Administered 2019-05-06 – 2019-05-08 (×4): 3 mL via INTRAVENOUS

## 2019-05-06 MED ORDER — OFF THE BEAT BOOK
Freq: Once | Status: AC
  Administered 2019-05-06: 22:00:00
  Filled 2019-05-06: qty 1

## 2019-05-06 MED ORDER — ATORVASTATIN CALCIUM 20 MG PO TABS
80.0000 mg | ORAL_TABLET | Freq: Every day | ORAL | Status: DC
  Administered 2019-05-07 – 2019-05-08 (×2): 80 mg via ORAL
  Filled 2019-05-06 (×2): qty 4

## 2019-05-06 MED ORDER — CARVEDILOL 25 MG PO TABS
25.0000 mg | ORAL_TABLET | Freq: Two times a day (BID) | ORAL | Status: DC
  Administered 2019-05-07 – 2019-05-08 (×2): 25 mg via ORAL
  Filled 2019-05-06 (×2): qty 1

## 2019-05-06 MED ORDER — ACETAMINOPHEN 325 MG PO TABS: 650.0000 mg | ORAL_TABLET | Freq: Four times a day (QID) | ORAL | Status: DC | PRN

## 2019-05-06 MED ORDER — HEPARIN BOLUS VIA INFUSION
4000.0000 [IU] | Freq: Once | INTRAVENOUS | Status: AC
  Administered 2019-05-06: 4000 [IU] via INTRAVENOUS
  Filled 2019-05-06: qty 4000

## 2019-05-06 MED ORDER — FLUTICASONE PROPIONATE 50 MCG/ACT NA SUSP
1.0000 | Freq: Two times a day (BID) | NASAL | Status: DC
  Administered 2019-05-07 – 2019-05-08 (×3): 1 via NASAL
  Filled 2019-05-06: qty 16

## 2019-05-06 MED ORDER — DILTIAZEM HCL 25 MG/5ML IV SOLN
20.0000 mg | Freq: Once | INTRAVENOUS | Status: AC
  Administered 2019-05-06: 20 mg via INTRAVENOUS
  Filled 2019-05-06: qty 5

## 2019-05-06 MED ORDER — ONDANSETRON HCL 4 MG/2ML IJ SOLN: 4.0000 mg | Freq: Four times a day (QID) | INTRAMUSCULAR | Status: DC | PRN

## 2019-05-06 MED ORDER — DILTIAZEM HCL 100 MG IV SOLR
INTRAVENOUS | Status: AC
  Filled 2019-05-06: qty 100

## 2019-05-06 MED ORDER — NITROGLYCERIN 0.4 MG SL SUBL
0.4000 mg | SUBLINGUAL_TABLET | Freq: Once | SUBLINGUAL | Status: AC
  Administered 2019-05-06: 0.4 mg via SUBLINGUAL
  Filled 2019-05-06: qty 1

## 2019-05-06 MED ORDER — DILTIAZEM HCL 25 MG/5ML IV SOLN: 10.0000 mg | INTRAVENOUS | Status: DC | PRN

## 2019-05-06 MED ORDER — ACETAMINOPHEN 650 MG RE SUPP: 650.0000 mg | Freq: Four times a day (QID) | RECTAL | Status: DC | PRN

## 2019-05-06 MED ORDER — DILTIAZEM HCL 100 MG IV SOLR: 5.0000 mg/h | INTRAVENOUS | Status: DC

## 2019-05-06 MED ORDER — HEPARIN (PORCINE) 25000 UT/250ML-% IV SOLN
1200.0000 [IU]/h | INTRAVENOUS | Status: DC
  Administered 2019-05-06 – 2019-05-07 (×2): 1200 [IU]/h via INTRAVENOUS
  Filled 2019-05-06 (×2): qty 250

## 2019-05-06 MED ORDER — COQ10 100 MG PO CAPS: 100.0000 mg | ORAL_CAPSULE | Freq: Every day | ORAL | Status: DC

## 2019-05-06 MED ORDER — ASPIRIN EC 81 MG PO TBEC
81.0000 mg | DELAYED_RELEASE_TABLET | Freq: Every day | ORAL | Status: DC
  Administered 2019-05-07: 81 mg via ORAL
  Filled 2019-05-06: qty 1

## 2019-05-06 MED ORDER — ALBUTEROL SULFATE (2.5 MG/3ML) 0.083% IN NEBU: 2.5000 mg | INHALATION_SOLUTION | RESPIRATORY_TRACT | Status: DC | PRN

## 2019-05-06 MED ORDER — ISOSORBIDE MONONITRATE ER 60 MG PO TB24
60.0000 mg | ORAL_TABLET | Freq: Every day | ORAL | Status: DC
  Administered 2019-05-07 – 2019-05-08 (×2): 60 mg via ORAL
  Filled 2019-05-06 (×2): qty 1

## 2019-05-06 MED ORDER — POLYETHYLENE GLYCOL 3350 17 G PO PACK: 17.0000 g | PACK | Freq: Every day | ORAL | Status: DC | PRN

## 2019-05-06 MED ORDER — DILTIAZEM HCL 25 MG/5ML IV SOLN
10.0000 mg | Freq: Once | INTRAVENOUS | Status: AC
  Administered 2019-05-06: 10 mg via INTRAVENOUS
  Filled 2019-05-06: qty 5

## 2019-05-06 MED ORDER — BENAZEPRIL HCL 20 MG PO TABS
40.0000 mg | ORAL_TABLET | Freq: Every day | ORAL | Status: DC
  Administered 2019-05-07 – 2019-05-08 (×2): 40 mg via ORAL
  Filled 2019-05-06 (×2): qty 2

## 2019-05-06 MED ORDER — CLOPIDOGREL BISULFATE 75 MG PO TABS
75.0000 mg | ORAL_TABLET | Freq: Every day | ORAL | Status: DC
  Administered 2019-05-07: 75 mg via ORAL
  Filled 2019-05-06: qty 1

## 2019-05-06 MED ORDER — ONDANSETRON HCL 4 MG PO TABS: 4.0000 mg | ORAL_TABLET | Freq: Four times a day (QID) | ORAL | Status: DC | PRN

## 2019-05-06 MED ORDER — SODIUM CHLORIDE 0.9 % IV BOLUS
500.0000 mL | Freq: Once | INTRAVENOUS | Status: AC
  Administered 2019-05-06: 500 mL via INTRAVENOUS

## 2019-05-06 NOTE — ED Notes (Signed)
Attempted to call report. Receiving nurse still in report.

## 2019-05-06 NOTE — H&P (Signed)
[facility] [place] NAME: Jeff Henderson    MR#:  [id number]  DATE OF BIRTH:  19-Feb-1941  DATE OF ADMISSION:  05/06/2019  PRIMARY CARE PHYSICIAN: [name], MD   REQUESTING/REFERRING PHYSICIAN: Dr. Jimmye Norman  CHIEF COMPLAINT:   Chief Complaint  Patient presents with  . Chest Pain    HISTORY OF PRESENT ILLNESS:  Jeff Henderson  is a 78 y.o. male with a known history of CAD, hypertension, CKD stage III presents to the emergency room complaining of chest pain radiating to left arm.  On arrival he was found to have atrial fibrillation with rapid ventricular rate.  He was given Cardizem 10 mg with no significant response.  After this he got a 20 mg bolus.  He had a brief pause and converted to normal sinus rhythm.  Patient's chest pain resolved.  Patient's troponin found to be elevated at 0.14 and is being admitted.  Patients last cardiac catheterization showed lesions of but not amenable to PCI.  Being managed medically.  Patient used to see Dr. Henrene Pastor shows but recently has decided to follow-up with Dr. Fletcher Anon.  PAST MEDICAL HISTORY:   Past Medical History:  Diagnosis Date  . Arthritis   . CAD (coronary artery disease)   . CKD (chronic kidney disease), stage III ([place])   . Gout   . Hernia, abdominal   . Hypertension   . Myocardial infarction [facility])     PAST SURGICAL HISTORY:   Past Surgical History:  Procedure Laterality Date  . carpel tunnel    . LEFT HEART CATH AND CORONARY ANGIOGRAPHY N/A 06/03/2018   Procedure: LEFT HEART CATH AND CORS/GRAFTS ANGIOGRAPHY;  Surgeon: Isaias Cowman, MD;  Location: [place] [facility];  Service: Cardiovascular;  Laterality: N/A;  . REPLACEMENT TOTAL KNEE    . VEIN BYPASS SURGERY      SOCIAL HISTORY:   Social History   Tobacco Use  . Smoking status: Never Smoker  . Smokeless tobacco: Never Used  Substance Use Topics  . Alcohol use: No    Alcohol/week: 0.0 standard drinks    FAMILY  HISTORY:   Family History  Problem Relation Age of Onset  . Asthma Mother   . Diabetes Father   . Hypertension Father     DRUG ALLERGIES:   Allergies  Allergen Reactions  . Penicillins Rash and Other (See Comments)    Has patient had a PCN reaction causing immediate rash, facial/tongue/throat swelling, SOB or lightheadedness with hypotension: No Has patient had a PCN reaction causing severe rash involving mucus membranes or skin necrosis: No Has patient had a PCN reaction that required hospitalization: No Has patient had a PCN reaction occurring within the last 10 years: No If all of the above answers are "NO", then may proceed with Cephalosporin use.     REVIEW OF SYSTEMS:   Review of Systems  Constitutional: Positive for malaise/fatigue. Negative for chills and fever.  HENT: Negative for sore throat.   Eyes: Negative for blurred vision, double vision and pain.  Respiratory: Negative for cough, hemoptysis, shortness of breath and wheezing.   Cardiovascular: Positive for chest pain and palpitations. Negative for orthopnea and leg swelling.  Gastrointestinal: Negative for abdominal pain, constipation, diarrhea, heartburn, nausea and vomiting.  Genitourinary: Negative for dysuria and hematuria.  Musculoskeletal: Negative for back pain and joint pain.  Skin: Negative for rash.  Neurological: Negative for sensory change, speech change, focal weakness and headaches.  Endo/Heme/Allergies: Does not bruise/bleed  easily.  Psychiatric/Behavioral: Negative for depression. The patient is not nervous/anxious.     MEDICATIONS AT HOME:   Prior to Admission medications   Medication Sig Start Date End Date Taking? Authorizing Provider  amLODipine-benazepril (LOTREL) 10-40 MG capsule Take 1 capsule by mouth daily.  10/19/14  Yes [provider]  aspirin 81 MG tablet Take 81 mg daily by mouth.  04/20/10  Yes [provider]  atorvastatin (LIPITOR) 80 MG tablet Take 80 mg by  mouth daily.    Yes [provider]  carvedilol (COREG) 12.5 MG tablet Take 12.5 mg by mouth 2 (two) times daily with a meal.    Yes [provider]  clopidogrel (PLAVIX) 75 MG tablet Take 75 mg by mouth daily.    Yes [provider]  Coenzyme Q10 (COQ10) 100 MG CAPS Take 100 mg by mouth daily.   Yes [provider]  fluticasone (FLONASE) 50 MCG/ACT nasal spray Place 1 spray into both nostrils 2 (two) times a day.   Yes [provider]  isosorbide mononitrate (IMDUR) 60 MG 24 hr tablet Take 60 mg by mouth daily.   Yes [provider]  Multiple Vitamins-Minerals (MENS 50+ MULTI VITAMIN/MIN) TABS Take 1 tablet by mouth daily.    Yes [provider]  Omega-3 1000 MG CAPS Take 1,000 mg by mouth.   Yes [provider]     VITAL SIGNS:  Blood pressure 140/84, pulse (!) 51, temperature 97.7 F (36.5 C), resp. rate 15, height 5' 8"  (1.727 m), weight 114.3 kg, SpO2 99 %.  PHYSICAL EXAMINATION:  Physical Exam  GENERAL:  78 y.o.-year-old patient lying in the bed with no acute distress.  Obese EYES: Pupils equal, round, reactive to light and accommodation. No scleral icterus. Extraocular muscles intact.  HEENT: Head atraumatic, normocephalic. Oropharynx and nasopharynx clear. No oropharyngeal erythema, moist oral mucosa  NECK:  Supple, no jugular venous distention. No thyroid enlargement, no tenderness.  LUNGS: Normal breath sounds bilaterally, no wheezing, rales, rhonchi. No use of accessory muscles of respiration.  CARDIOVASCULAR: S1, S2 normal. No murmurs, rubs, or gallops.  ABDOMEN: Soft, nontender, nondistended. Bowel sounds present. No organomegaly or mass.  EXTREMITIES: No pedal edema, cyanosis, or clubbing. + 2 pedal & radial pulses b/l.   NEUROLOGIC: Cranial nerves II through XII are intact. No focal Motor or sensory deficits appreciated b/l PSYCHIATRIC: The patient is alert and oriented x 3. Good affect.  SKIN: No  obvious rash, lesion, or ulcer.   LABORATORY PANEL:   CBC Recent Labs  Lab 05/06/19 1211  WBC 11.3*  HGB 12.0*  HCT 36.1*  PLT 178   ------------------------------------------------------------------------------------------------------------------  Chemistries  Recent Labs  Lab 05/06/19 1211  NA 140  K 4.7  CL 108  CO2 24  GLUCOSE 142*  BUN 31*  CREATININE 1.56*  CALCIUM 9.0  AST 28  ALT 19  ALKPHOS 41  BILITOT 1.0   ------------------------------------------------------------------------------------------------------------------  Cardiac Enzymes Recent Labs  Lab 05/06/19 1211  TROPONINI 0.14*   ------------------------------------------------------------------------------------------------------------------  RADIOLOGY:  Dg Chest Port 1 View  Result Date: 05/06/2019 CLINICAL DATA:  Chest pain for approximately 2 hours. EXAM: PORTABLE CHEST 1 VIEW COMPARISON:  Single-view of the chest 05/31/2018. FINDINGS: That would the patient is status post CABG. There is cardiomegaly. Atherosclerosis noted. Lungs are clear. No pneumothorax or pleural effusion. IMPRESSION: No acute disease. Cardiomegaly. Atherosclerosis. Electronically Signed   By: Inge Rise M.D.   On: 05/06/2019 12:30     IMPRESSION AND PLAN:   *  New onset atrial fibrillation.  Patient has responded to Cardizem IV boluses.  He will be admitted under observation.  Repeat troponin.  Check echocardiogram and TSH.  Consult cardiology.  Heparin drip started in the ER will be continued.  He is also on aspirin and Plavix at this point. We will increase his Coreg from 12.5 mg twice daily to 25 mg twice daily.  *CAD.  Mild elevation in troponin likely due to demand from rapid ventricular rate.  His chest pain has resolved once he is in normal sinus rhythm.  Will repeat troponin.  I do not expect any further work-up at this point.  *Hypertension.  Continue home medications.  Coreg dose increased.  Will stop  amlodipine.  Continue benazepril and Imdur.  *CKD stage III is stable  DVT prophylaxis.  On heparin drip.  All the records are reviewed and case discussed with ED provider. Management plans discussed with the patient, family and they are in agreement.  CODE STATUS: DNR/DNI  TOTAL TIME TAKING CARE OF THIS PATIENT: 40 minutes.   Leia Alf Nakari Bracknell M.D on 05/06/2019 at 3:31 PM  Between 7am to 6pm - Pager - [phone]  After 6pm go to [email or web address] - password [email or web address] [facility]  Office  [phone]  CC: Primary care physician; [name], MD  Note: This dictation was prepared with Dragon dictation along with smaller phrase technology. Any transcriptional errors that result from this process are unintentional.

## 2019-05-06 NOTE — Progress Notes (Signed)
ANTICOAGULATION CONSULT NOTE - Initial Consult  Pharmacy Consult for Heparin Indication: chest pain/ACS  Allergies  Allergen Reactions  . Penicillins Rash and Other (See Comments)    Has patient had a PCN reaction causing immediate rash, facial/tongue/throat swelling, SOB or lightheadedness with hypotension: No Has patient had a PCN reaction causing severe rash involving mucus membranes or skin necrosis: No Has patient had a PCN reaction that required hospitalization: No Has patient had a PCN reaction occurring within the last 10 years: No If all of the above answers are "NO", then may proceed with Cephalosporin use.     Patient Measurements: Height: 5' 8"  (172.7 cm) Weight: 252 lb (114.3 kg) IBW/kg (Calculated) : 68.4 Heparin Dosing Weight: 94.1 kg  Vital Signs: Temp: 97.7 F (36.5 C) (06/03 1209) BP: 224/205 (06/03 1245) Pulse Rate: 40 (06/03 1245)  Labs: Recent Labs    05/06/19 1211  HGB 12.0*  HCT 36.1*  PLT 178  CREATININE 1.56*  TROPONINI 0.14*    Estimated Creatinine Clearance: 47.1 mL/min (A) (by C-G formula based on SCr of 1.56 mg/dL (H)).   Medical History: Past Medical History:  Diagnosis Date  . Arthritis   . CAD (coronary artery disease)   . CKD (chronic kidney disease), stage III ([place])   . Gout   . Hernia, abdominal   . Hypertension   . Myocardial infarction [facility])      Assessment: 78 yo male here with chest pain and AFib with RVR to start on heparin drip. No oral anticoagulants noted on PTA med list.    Goal of Therapy:  Heparin level 0.3-0.7 units/ml Monitor platelets by anticoagulation protocol: Yes   Plan:  Add-on aPTT and INR ordered Heparin bolus 4000 units IV x1 then heparin drip at 1200 units/hr Heparin level 8h after start of infusion CBC in AM  Pharmacy will continue to follow.    Rayna Sexton L 05/06/2019,1:39 PM

## 2019-05-06 NOTE — ED Notes (Signed)
Report given to Alisa RN

## 2019-05-06 NOTE — ED Notes (Signed)
ED TO INPATIENT HANDOFF REPORT  ED Nurse Name and Phone #:  Quillian Quince [place] Name/Age/Gender [name] 78 y.o. male Room/Bed: ED16A/ED16A  Code Status   Code Status: DNR  Home/SNF/Other Home Patient oriented to: self, place, time and situation Is this baseline? Yes   Triage Complete: Triage complete  Chief Complaint chest pain  Triage Note Patient had a cardiac arrest in 2019. Patient is complaining of chest pain starting a couple of hours ago. He states that it is an '8' on a 0 - 10 scale.   Allergies Allergies  Allergen Reactions  . Penicillins Rash and Other (See Comments)    Has patient had a PCN reaction causing immediate rash, facial/tongue/throat swelling, SOB or lightheadedness with hypotension: No Has patient had a PCN reaction causing severe rash involving mucus membranes or skin necrosis: No Has patient had a PCN reaction that required hospitalization: No Has patient had a PCN reaction occurring within the last 10 years: No If all of the above answers are "NO", then may proceed with Cephalosporin use.     Level of Care/Admitting Diagnosis ED Disposition    ED Disposition [facility] [place] [[id number]]  Level of Care: Telemetry [5]  Covid Evaluation: N/A  Diagnosis: A-fib [facility]) [[id number]]  Admitting Physician: Hillary Bow [[id number]]  Attending Physician: Hillary Bow [[id number]]  PT Class (Do Not Modify): Observation [104]  PT Acc Code (Do Not Modify): Observation [10022]       B Medical/Surgery History Past Medical History:  Diagnosis Date  . Arthritis   . CAD (coronary artery disease)   . CKD (chronic kidney disease), stage III ([place])   . Gout   . Hernia, abdominal   . Hypertension   . Myocardial infarction [facility])    Past Surgical History:  Procedure Laterality Date  . carpel tunnel    . LEFT HEART CATH AND CORONARY ANGIOGRAPHY N/A 06/03/2018   Procedure: LEFT HEART CATH AND  CORS/GRAFTS ANGIOGRAPHY;  Surgeon: Isaias Cowman, MD;  Location: [place] [facility];  Service: Cardiovascular;  Laterality: N/A;  . REPLACEMENT TOTAL KNEE    . VEIN BYPASS SURGERY       A IV Location/Drains/Wounds Patient Lines/Drains/Airways Status   Active Line/Drains/Airways    Name:   Placement date:   Placement time:   Site:   Days:   Peripheral IV 05/06/19 Right Wrist   05/06/19    -    Wrist   less than 1   Peripheral IV 05/06/19 Left Forearm   05/06/19    1215    Forearm   less than 1   Incision (Closed) 06/01/18 Foot Left   06/01/18    0730     339   Wound / Incision (Open or Dehisced) 06/01/18 Other (Comment) Foot Left   06/01/18    0730    Foot   339          Intake/Output Last 24 hours  Intake/Output Summary (Last 24 hours) at 05/06/2019 1819 Last data filed at 05/06/2019 1504 Gross per 24 hour  Intake 1000 ml  Output -  Net 1000 ml    Labs/Imaging Results for orders placed or performed during the hospital encounter of 05/06/19 (from the past 48 hour(s))  Comprehensive metabolic panel     Status: Abnormal   Collection Time: 05/06/19 12:11 PM  Result Value Ref Range   Sodium 140 135 - 145 mmol/L   Potassium 4.7 3.5 -  5.1 mmol/L    Comment: HEMOLYSIS AT THIS LEVEL MAY AFFECT RESULT   Chloride 108 98 - 111 mmol/L   CO2 24 22 - 32 mmol/L   Glucose, Bld 142 (H) 70 - 99 mg/dL   BUN 31 (H) 8 - 23 mg/dL   Creatinine, Ser 1.56 (H) 0.61 - 1.24 mg/dL   Calcium 9.0 8.9 - 10.3 mg/dL   Total Protein 6.9 6.5 - 8.1 g/dL   Albumin 3.8 3.5 - 5.0 g/dL   AST 28 15 - 41 U/L   ALT 19 0 - 44 U/L   Alkaline Phosphatase 41 38 - 126 U/L   Total Bilirubin 1.0 0.3 - 1.2 mg/dL   GFR calc non Af Amer 42 (L) >60 mL/min   GFR calc Af Amer 48 (L) >60 mL/min   Anion gap 8 5 - 15    Comment: Performed at [facility], [address] [place], [place] [address]  CBC     Status: Abnormal   Collection Time: 05/06/19 12:11 PM  Result Value Ref Range   WBC 11.3 (H) 4.0 -  10.5 K/uL   RBC 3.76 (L) 4.22 - 5.81 MIL/uL   Hemoglobin 12.0 (L) 13.0 - 17.0 g/dL   HCT 36.1 (L) 39.0 - 52.0 %   MCV 96.0 80.0 - 100.0 fL   MCH 31.9 26.0 - 34.0 pg   MCHC 33.2 30.0 - 36.0 g/dL   RDW 13.0 11.5 - 15.5 %   Platelets 178 150 - 400 K/uL   nRBC 0.0 0.0 - 0.2 %    Comment: Performed at [facility], [address] [place], [place] [address]  Troponin I - Once     Status: Abnormal   Collection Time: 05/06/19 12:11 PM  Result Value Ref Range   Troponin I 0.14 (HH) <0.03 ng/mL    Comment: CRITICAL RESULT CALLED TO, READ BACK BY AND VERIFIED WITH  GREG MOYER AT 1242 05/06/2019 SDR Performed at [facility], [address], [place], [place] [address]   SARS Coronavirus 2 (CEPHEID - Performed in [place] hospital lab), Hosp Order     Status: None   Collection Time: 05/06/19 12:21 PM  Result Value Ref Range   SARS Coronavirus 2 NEGATIVE NEGATIVE    Comment: (NOTE) If result is NEGATIVE SARS-CoV-2 target nucleic acids are NOT DETECTED. The SARS-CoV-2 RNA is generally detectable in upper and lower  respiratory specimens during the acute phase of infection. The lowest  concentration of SARS-CoV-2 viral copies this assay can detect is 250  copies / mL. A negative result does not preclude SARS-CoV-2 infection  and should not be used as the sole basis for treatment or other  patient management decisions.  A negative result may occur with  improper specimen collection / handling, submission of specimen other  than nasopharyngeal swab, presence of viral mutation(s) within the  areas targeted by this assay, and inadequate number of viral copies  (<250 copies / mL). A negative result must be combined with clinical  observations, patient history, and epidemiological information. If result is POSITIVE SARS-CoV-2 target nucleic acids are DETECTED. The SARS-CoV-2 RNA is generally detectable in upper and lower  respiratory specimens dur ing the acute phase of  infection.  Positive  results are indicative of active infection with SARS-CoV-2.  Clinical  correlation with patient history and other diagnostic information is  necessary to determine patient infection status.  Positive results do  not rule out bacterial infection or co-infection with other  viruses. If result is PRESUMPTIVE POSTIVE SARS-CoV-2 nucleic acids MAY BE PRESENT.   A presumptive positive result was obtained on the submitted specimen  and confirmed on repeat testing.  While 2019 novel coronavirus  (SARS-CoV-2) nucleic acids may be present in the submitted sample  additional confirmatory testing may be necessary for epidemiological  and / or clinical management purposes  to differentiate between  SARS-CoV-2 and other Sarbecovirus currently known to infect humans.  If clinically indicated additional testing with an alternate test  methodology [id number]) is advised. The SARS-CoV-2 RNA is generally  detectable in upper and lower respiratory sp ecimens during the acute  phase of infection. The expected result is Negative. Fact Sheet for Patients:  StrictlyIdeas.no Fact Sheet for Healthcare Providers: BankingDealers.co.za This test is not yet approved or cleared by the [place] FDA and has been authorized for detection and/or diagnosis of SARS-CoV-2 by FDA under an Emergency Use Authorization (EUA).  This EUA will remain in effect (meaning this test can be used) for the duration of the COVID-19 declaration under Section 564(b)(1) of the Act, 21 U.S.C. section 360bbb-3(b)(1), unless the authorization is terminated or revoked sooner. Performed at [facility], [address] [place], [place] [address]   APTT     Status: None   Collection Time: 05/06/19  1:32 PM  Result Value Ref Range   aPTT 34 24 - 36 seconds    Comment: Performed at [facility], [address] [place], [place] [address]  Protime-INR      Status: None   Collection Time: 05/06/19  1:32 PM  Result Value Ref Range   Prothrombin Time 13.5 11.4 - 15.2 seconds   INR 1.0 0.8 - 1.2    Comment: (NOTE) INR goal varies based on device and disease states. Performed at [facility], [address] [place], [place] [address]    Dg Chest Port 1 View  Result Date: 05/06/2019 CLINICAL DATA:  Chest pain for approximately 2 hours. EXAM: PORTABLE CHEST 1 VIEW COMPARISON:  Single-view of the chest 05/31/2018. FINDINGS: That would the patient is status post CABG. There is cardiomegaly. Atherosclerosis noted. Lungs are clear. No pneumothorax or pleural effusion. IMPRESSION: No acute disease. Cardiomegaly. Atherosclerosis. Electronically Signed   By: Inge Rise M.D.   On: 05/06/2019 12:30    Pending Labs Unresulted Labs (From admission, onward)    Start     Ordered   05/07/19 0500  CBC  Tomorrow morning,   STAT     05/06/19 1355   05/07/19 7622  Basic metabolic panel  Tomorrow morning,   STAT     05/06/19 1528   05/06/19 2200  Heparin level (unfractionated)  Once-Timed,   STAT     05/06/19 1517          Vitals/Pain Today's Vitals   05/06/19 1545 05/06/19 1600 05/06/19 1615 05/06/19 1737  BP: (!) 142/81 (!) 142/83 (!) 144/81 (!) 158/72  Pulse: (!) 50 (!) 51 (!) 50 73  Resp: 13 15 14 16   Temp:      SpO2: 98% 100% 100% 98%  Weight:      Height:      PainSc:    0-No pain    Isolation Precautions No active isolations  Medications Medications  heparin ADULT infusion 100 units/mL (25000 units/221m sodium chloride 0.45%) (1,200 Units/hr Intravenous New Bag/Given 05/06/19 1400)  sodium chloride flush (NS) 0.9 % injection 3 mL (3 mLs Intravenous Not Given 05/06/19 1619)  acetaminophen (TYLENOL) tablet 650  mg (has no administration in time range)    Or  acetaminophen (TYLENOL) suppository 650 mg (has no administration in time range)  polyethylene glycol (MIRALAX / GLYCOLAX) packet 17 g (has no administration in time  range)  ondansetron (ZOFRAN) tablet 4 mg (has no administration in time range)    Or  ondansetron (ZOFRAN) injection 4 mg (has no administration in time range)  albuterol (PROVENTIL) (2.5 MG/3ML) 0.083% nebulizer solution 2.5 mg (has no administration in time range)  carvedilol (COREG) tablet 25 mg (25 mg Oral Not Given 05/06/19 1620)  benazepril (LOTENSIN) tablet 40 mg (has no administration in time range)  diltiazem (CARDIZEM) injection 10 mg (has no administration in time range)  diltiazem (CARDIZEM) injection 10 mg (10 mg Intravenous Given 05/06/19 1220)  diltiazem (CARDIZEM) injection 20 mg (20 mg Intravenous Given 05/06/19 1252)  nitroGLYCERIN (NITROSTAT) SL tablet 0.4 mg (0.4 mg Sublingual Given 05/06/19 1246)  sodium chloride 0.9 % bolus 500 mL (0 mLs Intravenous Stopped 05/06/19 1504)  heparin bolus via infusion 4,000 Units (4,000 Units Intravenous Bolus from Bag 05/06/19 1401)    Mobility walks Low fall risk   Focused Assessments Cardiac Assessment Handoff:    Lab Results  Component Value Date   TROPONINI 0.14 ([place]) 05/06/2019   No results found for: DDIMER Does the Patient currently have chest pain? No     R Recommendations: See Admitting Provider Note  Report given to:   Additional Notes:

## 2019-05-06 NOTE — Progress Notes (Signed)
Advance care planning  Purpose of Encounter Atrial fibrillation  Parties in Attendance Patient  Patients Decisional capacity Alert and oriented.  Able to make medical decisions.  He would like his friend Ivin Booty to make decisions for him if he is unable to.  Daughter is in [place].  Discussed in detail regarding atrial fibrillation.  Treatment plan , prognosis discussed.  All questions answered.  CODE STATUS discussed.  Patient tells me he has a living well and would not want ventilator or CPR.  Would like aggressive medical care at this time.  Orders entered and CODE STATUS changed.  DNR/DNI  Time spent - 17 minutes

## 2019-05-06 NOTE — ED Provider Notes (Addendum)
I was sitting talking to the patient when he had long sinus pauses and appeared to go in asystole with subsequent conversion into a normal sinus rhythm.  He was asymptomatic during this time.  EKG: Interpreted by me, sinus rhythm with a rate of 54 bpm, repolarization abnormality, normal axis, normal QT   Earleen Newport, MD 05/06/19 1338    Earleen Newport, MD 05/06/19 [phone]

## 2019-05-06 NOTE — ED Provider Notes (Signed)
Patient seen and examined by me, presents with rapid A. fib and chest pain.  He will be given Cardizem for rate control.  Anticipate admission for full work-up.   Earleen Newport, MD 05/06/19 1220

## 2019-05-06 NOTE — ED Triage Notes (Signed)
Patient had a cardiac arrest in 2019. Patient is complaining of chest pain starting a couple of hours ago. He states that it is an '8' on a 0 - 10 scale.

## 2019-05-06 NOTE — Progress Notes (Signed)

## 2019-05-06 NOTE — ED Provider Notes (Signed)
[facility] Emergency Department Provider Note  ____________________________________________   First MD Initiated Contact with Patient 05/06/19 1218     (approximate)  I have reviewed the triage vital signs and the nursing notes.   HISTORY  Chief Complaint Chest Pain    HPI Jeff Henderson is a 78 y.o. male presents emergency department complaining of a sudden onset of chest pain in the center of his chest that radiated into the left arm and up to the neck.  Pain started couple of hours ago.  States it was an 8 out of 10 on the way in and is now a 6 out of 10.  Patient had an MI in 2019.  No stents were placed.  Patient is currently on Plavix and took a 81 mg aspirin this morning.  He denies any fever, chills, cough or exposure to COVID.  His regular cardiologist is Dr. Fletcher Anon.    Past Medical History:  Diagnosis Date  . Arthritis   . CAD (coronary artery disease)   . CKD (chronic kidney disease), stage III ([place])   . Gout   . Hernia, abdominal   . Hypertension   . Myocardial infarction [facility])     Patient Active Problem List   Diagnosis Date Noted  . NSTEMI (non-ST elevated myocardial infarction) ([place]) 06/01/2018  . CKD (chronic kidney disease), stage III ([place]) 05/31/2018  . Chest pain 05/31/2018  . Dupuytren's contracture of left hand 05/19/2018  . Hyperlipidemia, mixed 10/11/2017  . Umbilical hernia without obstruction and without gangrene 12/20/2015  . DDD (degenerative disc disease), cervical 08/01/2015  . 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 Arteriosclerosis of coronary artery 12/17/2014  . HTN (hypertension) 12/17/2014  . Impaired renal function 12/17/2014  . Kidney cysts 10/23/2012  . 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 Polypharmacy 01/30/2012  . Arthritis, degenerative 01/30/2012    Past Surgical History:  Procedure Laterality Date  . carpel tunnel    . LEFT HEART CATH AND CORONARY ANGIOGRAPHY N/A 06/03/2018   Procedure: LEFT HEART CATH AND  CORS/GRAFTS ANGIOGRAPHY;  Surgeon: Isaias Cowman, MD;  Location: [place] [facility];  Service: Cardiovascular;  Laterality: N/A;  . REPLACEMENT TOTAL KNEE    . VEIN BYPASS SURGERY      Prior to Admission medications   Medication Sig Start Date End Date Taking? Authorizing Provider  amLODipine-benazepril (LOTREL) 10-40 MG capsule Take 1 capsule by mouth daily.  10/19/14  Yes [provider]  aspirin 81 MG tablet Take 81 mg daily by mouth.  04/20/10  Yes [provider]  atorvastatin (LIPITOR) 80 MG tablet Take 80 mg by mouth daily.    Yes [provider]  carvedilol (COREG) 12.5 MG tablet Take 12.5 mg by mouth 2 (two) times daily with a meal.    Yes [provider]  clopidogrel (PLAVIX) 75 MG tablet Take 75 mg by mouth daily.    Yes [provider]  Coenzyme Q10 (COQ10) 100 MG CAPS Take 100 mg by mouth daily.   Yes [provider]  fluticasone (FLONASE) 50 MCG/ACT nasal spray Place 1 spray into both nostrils 2 (two) times a day.   Yes [provider]  isosorbide mononitrate (IMDUR) 60 MG 24 hr tablet Take 60 mg by mouth daily.   Yes [provider]  Multiple Vitamins-Minerals (MENS 50+ MULTI VITAMIN/MIN) TABS Take 1 tablet by mouth daily.    Yes [provider]  Omega-3 1000 MG CAPS Take 1,000 mg by mouth.   Yes  [provider]    Allergies Penicillins  Family History  Problem Relation Age of Onset  . Asthma Mother   . Diabetes Father   . Hypertension Father     Social History Social History   Tobacco Use  . Smoking status: Never Smoker  . Smokeless tobacco: Never Used  Substance Use Topics  . Alcohol use: No    Alcohol/week: 0.0 standard drinks  . Drug use: No    Review of Systems  Constitutional: No fever/chills Eyes: No visual changes. ENT: No sore throat. Respiratory: Denies cough Cardiovascular: Complains of chest pain with radiation to the left arm and neck  Genitourinary: Negative for dysuria. Musculoskeletal: Negative for back pain. Skin: Negative for rash.    ____________________________________________   PHYSICAL EXAM:  VITAL SIGNS: ED Triage Vitals  Enc Vitals Group     BP 05/06/19 1209 122/90     Pulse Rate 05/06/19 1209 (!) 120     Resp 05/06/19 1209 (!) 24     Temp 05/06/19 1209 97.7 F (36.5 C)     Temp src --      SpO2 05/06/19 1209 95 %     Weight 05/06/19 1210 252 lb (114.3 kg)     Height 05/06/19 1210 5' 8"  (1.727 m)     Head Circumference --      Peak Flow --      Pain Score 05/06/19 1210 8     Pain Loc --      Pain Edu? --      Excl. in [place]? --     Constitutional: Alert and oriented. Well appearing and in no acute distress. Eyes: Conjunctivae are normal.  Head: Atraumatic. Nose: No congestion/rhinnorhea. Mouth/Throat: Mucous membranes are moist.   Neck:  supple no lymphadenopathy noted Cardiovascular: Rapid rate, irregular rhythm. Heart sounds are normal Respiratory: Normal respiratory effort.  No retractions, lungs c t a  Abd: soft nontender bs normal all 4 quad, no pulsatile masses noted GU: deferred Musculoskeletal: FROM all extremities, warm and well perfused Neurologic:  Normal speech and language.  Skin:  Skin is warm, dry and intact. No rash noted. Psychiatric: Mood and affect are normal. Speech and behavior are normal.  ____________________________________________   LABS (all labs ordered are listed, but only abnormal results are displayed)  Labs Reviewed  COMPREHENSIVE METABOLIC PANEL - Abnormal; Notable for the following components:      Result Value   Glucose, Bld 142 (*)    BUN 31 (*)    Creatinine, Ser 1.56 (*)    GFR calc non Af Amer 42 (*)    GFR calc Af Amer 48 (*)    All other components within normal limits  CBC - Abnormal; Notable for the following components:   WBC 11.3 (*)    RBC 3.76 (*)    Hemoglobin 12.0 (*)    HCT 36.1 (*)    All other components within normal limits   TROPONIN I - Abnormal; Notable for the following components:   Troponin I 0.14 (*)    All other components within normal limits  SARS CORONAVIRUS 2 (HOSPITAL ORDER, [place] [facility])  APTT  PROTIME-INR  HEPARIN LEVEL (UNFRACTIONATED)   ____________________________________________   ____________________________________________  RADIOLOGY  Chest x-ray is negative for acute findings  ____________________________________________   PROCEDURES  Procedure(s) performed:, Cardizem 10 mg IV , nitro, repeat cardizem 10  Procedures    ____________________________________________   INITIAL IMPRESSION / ASSESSMENT AND PLAN / ED COURSE  Pertinent labs & imaging results that were available during my care of the patient were reviewed by me and considered in my medical decision making (see chart for details).   Patient 78 year old male presents emergency department with complaints of chest pain with radiation to the left arm and neck.  History of a MI in 2019.  Because it was "mild "he did not receive stents or CABG.  Physical exam patient appears pale but is not diaphoretic.  Lungs clear to all station, heart with rapid irregular rate typical of atrial fib.  EKG shows rapid rate atrial fibrillation  Cardizem 10 mg IV given  CBC, comprehensive metabolic panel, troponin, COVID-19 test, chest x-ray  Ddx: atrial fib, MI, STEMI, dissection  ----------------------------------------- 1:04 PM on 05/06/2019 -----------------------------------------  CBC shows elevated WBC at 11.3, troponin elevated at 0.14, conference of metabolic panel shows increased glucose at 142, creatinine and BUN are also elevated.  Patient did not respond to the first dose of Cardizem , nitro and a second dose of Cardizem were given.  Dr. Jimmye Norman in to see the patient.  Diagnosis chest pain, rapid A. fib  As part of my medical decision making, I reviewed the following data within the  [facility] notes reviewed and incorporated, Labs reviewed see above, EKG interpreted atrial fibrillation, Old EKG reviewed, Old chart reviewed, Radiograph reviewed chest x-ray is normal, A consult was requested and obtained from this/these consultant(s) Cardiology, Evaluated by EM attending Dr. Jimmye Norman, Notes from prior ED visits and [place] Controlled Substance Database  ____________________________________________   FINAL CLINICAL IMPRESSION(S) / ED DIAGNOSES  Final diagnoses:  Unstable angina ([place])  Atrial fibrillation with rapid ventricular response ([place])  Sinus pause      NEW MEDICATIONS STARTED DURING THIS VISIT:  New Prescriptions   No medications on file     Note:  This document was prepared using Dragon voice recognition software and may include unintentional dictation errors.    Versie Starks, PA-C 05/06/19 1419    Earleen Newport, MD 05/06/19 [phone]

## 2019-05-07 ENCOUNTER — Encounter
Admission: EM | Disposition: A | Discharge status: Home / Self Care | Payer: Self-pay | Source: Home / Self Care | Attending: Internal Medicine

## 2019-05-07 ENCOUNTER — Other Ambulatory Visit: Payer: Self-pay

## 2019-05-07 ENCOUNTER — Observation Stay (HOSPITAL_COMMUNITY)
Admit: 2019-05-07 | Discharge: 2019-05-07 | Disposition: A | Discharge status: Home / Self Care | Payer: Medicare Other | Attending: Internal Medicine | Admitting: Internal Medicine

## 2019-05-07 DIAGNOSIS — I2510 Atherosclerotic heart disease of native coronary artery without angina pectoris: Secondary | ICD-10-CM

## 2019-05-07 DIAGNOSIS — Z8249 Family history of ischemic heart disease and other diseases of the circulatory system: Secondary | ICD-10-CM | POA: Diagnosis not present

## 2019-05-07 DIAGNOSIS — I214 Non-ST elevation (NSTEMI) myocardial infarction: Secondary | ICD-10-CM | POA: Diagnosis present

## 2019-05-07 DIAGNOSIS — I25810 Atherosclerosis of coronary artery bypass graft(s) without angina pectoris: Secondary | ICD-10-CM | POA: Diagnosis present

## 2019-05-07 DIAGNOSIS — Z96659 Presence of unspecified artificial knee joint: Secondary | ICD-10-CM | POA: Diagnosis present

## 2019-05-07 DIAGNOSIS — I5042 Chronic combined systolic (congestive) and diastolic (congestive) heart failure: Secondary | ICD-10-CM | POA: Diagnosis present

## 2019-05-07 DIAGNOSIS — I25110 Atherosclerotic heart disease of native coronary artery with unstable angina pectoris: Secondary | ICD-10-CM | POA: Diagnosis present

## 2019-05-07 DIAGNOSIS — Z79899 Other long term (current) drug therapy: Secondary | ICD-10-CM | POA: Diagnosis not present

## 2019-05-07 DIAGNOSIS — I455 Other specified heart block: Secondary | ICD-10-CM

## 2019-05-07 DIAGNOSIS — I4891 Unspecified atrial fibrillation: Secondary | ICD-10-CM

## 2019-05-07 DIAGNOSIS — D472 Monoclonal gammopathy: Secondary | ICD-10-CM | POA: Diagnosis present

## 2019-05-07 DIAGNOSIS — Z951 Presence of aortocoronary bypass graft: Secondary | ICD-10-CM | POA: Diagnosis not present

## 2019-05-07 DIAGNOSIS — Z8673 Personal history of transient ischemic attack (TIA), and cerebral infarction without residual deficits: Secondary | ICD-10-CM | POA: Diagnosis not present

## 2019-05-07 DIAGNOSIS — M109 Gout, unspecified: Secondary | ICD-10-CM | POA: Diagnosis present

## 2019-05-07 DIAGNOSIS — Z825 Family history of asthma and other chronic lower respiratory diseases: Secondary | ICD-10-CM | POA: Diagnosis not present

## 2019-05-07 DIAGNOSIS — I252 Old myocardial infarction: Secondary | ICD-10-CM | POA: Diagnosis not present

## 2019-05-07 DIAGNOSIS — Z66 Do not resuscitate: Secondary | ICD-10-CM | POA: Diagnosis present

## 2019-05-07 DIAGNOSIS — N183 Chronic kidney disease, stage 3 (moderate): Secondary | ICD-10-CM | POA: Diagnosis present

## 2019-05-07 DIAGNOSIS — Z7951 Long term (current) use of inhaled steroids: Secondary | ICD-10-CM | POA: Diagnosis not present

## 2019-05-07 DIAGNOSIS — Z1159 Encounter for screening for other viral diseases: Secondary | ICD-10-CM | POA: Diagnosis not present

## 2019-05-07 DIAGNOSIS — E785 Hyperlipidemia, unspecified: Secondary | ICD-10-CM | POA: Diagnosis present

## 2019-05-07 DIAGNOSIS — Z833 Family history of diabetes mellitus: Secondary | ICD-10-CM | POA: Diagnosis not present

## 2019-05-07 DIAGNOSIS — I200 Unstable angina: Secondary | ICD-10-CM | POA: Diagnosis present

## 2019-05-07 DIAGNOSIS — I130 Hypertensive heart and chronic kidney disease with heart failure and stage 1 through stage 4 chronic kidney disease, or unspecified chronic kidney disease: Secondary | ICD-10-CM | POA: Diagnosis present

## 2019-05-07 DIAGNOSIS — Z7901 Long term (current) use of anticoagulants: Secondary | ICD-10-CM | POA: Diagnosis not present

## 2019-05-07 DIAGNOSIS — I420 Dilated cardiomyopathy: Secondary | ICD-10-CM | POA: Diagnosis not present

## 2019-05-07 HISTORY — PX: LEFT HEART CATH AND CORS/GRAFTS ANGIOGRAPHY: CATH118250

## 2019-05-07 LAB — CBC
HCT: 35.5 % — ABNORMAL LOW (ref 39.0–52.0)
Hemoglobin: 11.6 g/dL — ABNORMAL LOW (ref 13.0–17.0)
MCH: 31.8 pg (ref 26.0–34.0)
MCHC: 32.7 g/dL (ref 30.0–36.0)
MCV: 97.3 fL (ref 80.0–100.0)
Platelets: 151 10*3/uL (ref 150–400)
RBC: 3.65 MIL/uL — ABNORMAL LOW (ref 4.22–5.81)
RDW: 12.9 % (ref 11.5–15.5)
WBC: 9.7 10*3/uL (ref 4.0–10.5)
nRBC: 0 % (ref 0.0–0.2)

## 2019-05-07 LAB — BASIC METABOLIC PANEL WITH GFR
Anion gap: 6 (ref 5–15)
BUN: 32 mg/dL — ABNORMAL HIGH (ref 8–23)
CO2: 24 mmol/L (ref 22–32)
Calcium: 8.7 mg/dL — ABNORMAL LOW (ref 8.9–10.3)
Chloride: 111 mmol/L (ref 98–111)
Creatinine, Ser: 1.59 mg/dL — ABNORMAL HIGH (ref 0.61–1.24)
GFR calc Af Amer: 47 mL/min — ABNORMAL LOW (ref >60)
GFR calc non Af Amer: 41 mL/min — ABNORMAL LOW (ref >60)
Glucose, Bld: 97 mg/dL (ref 70–99)
Potassium: 4.1 mmol/L (ref 3.5–5.1)
Sodium: 141 mmol/L (ref 135–145)

## 2019-05-07 LAB — HEMOGLOBIN A1C
Hgb A1c MFr Bld: 5.7 % — ABNORMAL HIGH (ref 4.8–5.6)
Mean Plasma Glucose: 116.89 mg/dL

## 2019-05-07 LAB — TROPONIN I
Troponin I: 53.2 ng/mL (ref <0.03)
Troponin I: 36.81 ng/mL (ref <0.03)
Troponin I: 35.98 ng/mL (ref <0.03)

## 2019-05-07 LAB — ECHOCARDIOGRAM COMPLETE
Height: 68 in
Weight: 4235.2 oz

## 2019-05-07 LAB — TSH: TSH: 1.629 u[IU]/mL (ref 0.350–4.500)

## 2019-05-07 LAB — HEPARIN LEVEL (UNFRACTIONATED): Heparin Unfractionated: 0.58 [IU]/mL (ref 0.30–0.70)

## 2019-05-07 SURGERY — LEFT HEART CATH AND CORS/GRAFTS ANGIOGRAPHY
Anesthesia: Moderate Sedation | Laterality: Bilateral

## 2019-05-07 MED ORDER — SODIUM CHLORIDE 0.9 % WEIGHT BASED INFUSION: 3.0000 mL/kg/h | INTRAVENOUS | Status: AC

## 2019-05-07 MED ORDER — HEPARIN (PORCINE) IN NACL 1000-0.9 UT/500ML-% IV SOLN
INTRAVENOUS | Status: DC | PRN
  Administered 2019-05-07 (×2): 500 mL

## 2019-05-07 MED ORDER — ACETAMINOPHEN 325 MG PO TABS: 650.0000 mg | ORAL_TABLET | ORAL | Status: DC | PRN

## 2019-05-07 MED ORDER — SODIUM CHLORIDE 0.9 % IV SOLN: 250.0000 mL | INTRAVENOUS | Status: DC | PRN

## 2019-05-07 MED ORDER — ONDANSETRON HCL 4 MG/2ML IJ SOLN: 4.0000 mg | Freq: Four times a day (QID) | INTRAMUSCULAR | Status: DC | PRN

## 2019-05-07 MED ORDER — LABETALOL HCL 5 MG/ML IV SOLN: 10.0000 mg | INTRAVENOUS | Status: AC | PRN

## 2019-05-07 MED ORDER — SODIUM CHLORIDE 0.9% FLUSH
3.0000 mL | Freq: Two times a day (BID) | INTRAVENOUS | Status: DC
  Administered 2019-05-07 – 2019-05-08 (×2): 3 mL via INTRAVENOUS

## 2019-05-07 MED ORDER — IOHEXOL 300 MG/ML  SOLN
INTRAMUSCULAR | Status: DC | PRN
  Administered 2019-05-07: 150 mL via INTRA_ARTERIAL

## 2019-05-07 MED ORDER — FENTANYL CITRATE (PF) 100 MCG/2ML IJ SOLN
INTRAMUSCULAR | Status: DC | PRN
  Administered 2019-05-07: 50 ug via INTRAVENOUS

## 2019-05-07 MED ORDER — SODIUM CHLORIDE 0.9 % WEIGHT BASED INFUSION: 1.0000 mL/kg/h | INTRAVENOUS | Status: DC

## 2019-05-07 MED ORDER — FENTANYL CITRATE (PF) 100 MCG/2ML IJ SOLN
INTRAMUSCULAR | Status: AC
  Filled 2019-05-07: qty 2

## 2019-05-07 MED ORDER — HYDRALAZINE HCL 20 MG/ML IJ SOLN: 10.0000 mg | INTRAMUSCULAR | Status: AC | PRN

## 2019-05-07 MED ORDER — SODIUM CHLORIDE 0.9% FLUSH: 3.0000 mL | INTRAVENOUS | Status: DC | PRN

## 2019-05-07 MED ORDER — ASPIRIN 81 MG PO CHEW: 81.0000 mg | CHEWABLE_TABLET | ORAL | Status: DC

## 2019-05-07 MED ORDER — LIDOCAINE HCL (PF) 1 % IJ SOLN
INTRAMUSCULAR | Status: DC | PRN
  Administered 2019-05-07: 20 mL

## 2019-05-07 MED ORDER — HEPARIN (PORCINE) IN NACL 1000-0.9 UT/500ML-% IV SOLN
INTRAVENOUS | Status: AC
  Filled 2019-05-07: qty 1000

## 2019-05-07 MED ORDER — MIDAZOLAM HCL 2 MG/2ML IJ SOLN
INTRAMUSCULAR | Status: DC | PRN
  Administered 2019-05-07: 1 mg via INTRAVENOUS

## 2019-05-07 MED ORDER — MIDAZOLAM HCL 2 MG/2ML IJ SOLN
INTRAMUSCULAR | Status: AC
  Filled 2019-05-07: qty 2

## 2019-05-07 MED ORDER — SODIUM CHLORIDE 0.9 % WEIGHT BASED INFUSION: 1.0000 mL/kg/h | INTRAVENOUS | Status: AC

## 2019-05-07 SURGICAL SUPPLY — 12 items
CATH INFINITI 5FR JL4 (CATHETERS) ×3 IMPLANT
CATH INFINITI JR4 5F (CATHETERS) ×3 IMPLANT
DEVICE CLOSURE MYNXGRIP 6/7F (Vascular Products) ×3 IMPLANT
KIT MANI 3VAL PERCEP (MISCELLANEOUS) ×3 IMPLANT
NEEDLE PERC 18GX7CM (NEEDLE) ×3 IMPLANT
NEEDLE SMART 18G ACCESS (NEEDLE) ×3 IMPLANT
PACK CARDIAC CATH (CUSTOM PROCEDURE TRAY) ×3 IMPLANT
SHEATH AVANTI 5FR X 11CM (SHEATH) ×3 IMPLANT
SHEATH AVANTI 6FR X 11CM (SHEATH) ×3 IMPLANT
SHEATH BRITE TIP 6FR X 23 (SHEATH) ×3 IMPLANT
WIRE GUIDERIGHT .035X150 (WIRE) ×6 IMPLANT
WIRE HITORQ VERSACORE ST 145CM (WIRE) ×3 IMPLANT

## 2019-05-07 NOTE — Consult Note (Signed)
Cardiology Consultation:   Patient ID: Jeff Henderson MRN: [id number]; DOB: 1941/03/27  Admit date: 05/06/2019 Date of Consult: 05/07/2019  Primary Care Provider: [name], MD Primary Cardiologist: Previously [facility] cardi[facility], [facility]-Jeff [facility] Primary Electrophysiologist:  None    Patient Profile:   Jeff Henderson is a 78 y.o. male with a hx of CAD s/p CABG (1995), NSTEMI (05/2018), HTN, HLD, CKDIII, MGUS, subclavian artery stenosis, who is being seen today for the evaluation of new onset Atrial Fibrillation and chest pain with elevated troponin at the request of Jeff Henderson.  History of Present Illness:   Jeff Henderson is a 78 year old male with PMH as above.  He has chronic kidney disease with difficult to control blood pressure.  He also has a h/o MGUS.  02/2019 renal artery scan showed no evidence of R sided RAS but cysts noted, as well as  L sided 1-59% RAS.  He is known to have left subclavian artery stenosis was evaluated by angiogram at [facility] in 2011.  02/2019 carotid ultrasound showed moderate bilateral disease without significant stenosis of the L subclavian artery.  He is status post CABG in 1995 with LIMA-LAD, saphenous vein graft to the left circumflex, saphenous vein graft to the PDA.  It showed that stenosis was 40% at the time and a tortuous segment.  No intervention was performed.  He had an abnormal stress test in 2011 and underwent cardiac catheterization revealing normal LV function with an occluded proximal left circumflex, occluded proximal RCA, and occluded mid LAD.  He had a patent saphenous vein graft to the PDA.  He had an occluded saphenous vein graft to the left circumflex, patent left internal mammary artery to the LAD, with proximal 70% stenosis of the left subclavian artery prior to takeoff of the left internal mammary artery.  Decision at that time was not to stent the subclavian stenosis.  05/2018, he presented with chest pain and ruled in for NSTEMI.   Cardiac catheterization showed severe three-vessel coronary artery disease including total occlusions of the proximal RCA and mid LAD.  Distal left circumflex and grafted OM also appeared chronically occluded.  There is 99% ostial LAD stenosis, supplying 2 diagonal branches.  LIMA to LAD was patent as well as SVG to RCA.  There is suggestion of stenosis to the proximal left subclavian artery, though it was not well visualized.  No intervention was performed at that time.  LVEF mildly reduced to 45 to 55% on echo.  On 05/06/2019, he was reportedly walking around his yard for exercise and then sat down to enjoy a protein shake when he felt rapid heartbeat, palpitations ("chest fluttering"), and 8/10 central, non-pleuritic, and non-radiating chest pain.  He reported that the chest pain/ pressure lasted for ~10 minutes and was associated with transient left arm paresthesias.  He denied any repeat episodes of this chest pain since that time.  He denied associated nausea, emesis, shortness of breath, near-syncope, and syncope.  He denied ever feeling anything like this before in the past.  In the [facility] ED, he was found to be in Afib with RVR. Vitals significant for BP 122/90, HR 120, RR 24, T 97.7. Labs showed Na 140  141, K 4.7  4.1, Cr 1.56  1.59, BUN 31  32,WBC 11.3  9.7, troponin 0.14. EKG showed Afib with RVR, ventricular rate of 155 bpm.  Chest x-ray showed no acute cardiopulmonary disease, cardiomegaly, and atherosclerosis.  Telemetry was significant for long sinus pauses and subsequent conversion into  NSR per ED provider notes (length of pause unknown, not in CV strips). Repeat EKG was performed and NSR, 54bpm (not available at this time on EMR).  Cardiology was consulted for further management.  No further chest pain on exam today.   Past Medical History:  Diagnosis Date   Arthritis    CAD (coronary artery disease)    CKD (chronic kidney disease), stage III (HCC)    Gout    Hernia, abdominal     Hypertension    Myocardial infarction [facility])     Past Surgical History:  Procedure Laterality Date   carpel tunnel     LEFT HEART CATH AND CORONARY ANGIOGRAPHY N/A 06/03/2018   Procedure: LEFT HEART CATH AND CORS/GRAFTS ANGIOGRAPHY;  Surgeon: Jeff Cowman, MD;  Location: [place] [facility];  Service: Cardiovascular;  Laterality: N/A;   REPLACEMENT TOTAL KNEE     VEIN BYPASS SURGERY       Home Medications:  Prior to Admission medications   Medication Sig Start Date End Date Taking? Authorizing Provider  amLODipine-benazepril (LOTREL) 10-40 MG capsule Take 1 capsule by mouth daily.  10/19/14  Yes [provider]  aspirin 81 MG tablet Take 81 mg daily by mouth.  04/20/10  Yes [provider]  atorvastatin (LIPITOR) 80 MG tablet Take 80 mg by mouth daily.    Yes [provider]  carvedilol (COREG) 12.5 MG tablet Take 12.5 mg by mouth 2 (two) times daily with a meal.    Yes [provider]  clopidogrel (PLAVIX) 75 MG tablet Take 75 mg by mouth daily.    Yes [provider]  Coenzyme Q10 (COQ10) 100 MG CAPS Take 100 mg by mouth daily.   Yes [provider]  fluticasone (FLONASE) 50 MCG/ACT nasal spray Place 1 spray into both nostrils 2 (two) times a day.   Yes [provider]  isosorbide mononitrate (IMDUR) 60 MG 24 hr tablet Take 60 mg by mouth daily.   Yes [provider]  Multiple Vitamins-Minerals (MENS 50+ MULTI VITAMIN/MIN) TABS Take 1 tablet by mouth daily.    Yes [provider]  Omega-3 1000 MG CAPS Take 1,000 mg by mouth.   Yes [provider]    Inpatient Medications: Scheduled Meds:  aspirin EC  81 mg Oral Daily   atorvastatin  80 mg Oral Daily   benazepril  40 mg Oral Daily   carvedilol  25 mg Oral BID WC   clopidogrel  75 mg Oral Daily   fluticasone  1 spray Each Nare BID   isosorbide mononitrate  60 mg Oral Daily   sodium chloride flush  3 mL Intravenous Q12H    Continuous Infusions:  heparin 1,200 Units/hr (05/07/19 0726)   PRN Meds: acetaminophen **OR** acetaminophen, albuterol, diltiazem, ondansetron **OR** ondansetron (ZOFRAN) IV, polyethylene glycol  Allergies:    Allergies  Allergen Reactions   Penicillins Rash and Other (See Comments)    Has patient had a PCN reaction causing immediate rash, facial/tongue/throat swelling, SOB or lightheadedness with hypotension: No Has patient had a PCN reaction causing severe rash involving mucus membranes or skin necrosis: No Has patient had a PCN reaction that required hospitalization: No Has patient had a PCN reaction occurring within the last 10 years: No If all of the above answers are "NO", then may proceed with Cephalosporin use.     Social History:   Social History   Socioeconomic History   Marital status: Unknown    Spouse name: Not on file  Number of children: Not on file   Years of education: Not on file   Highest education level: Not on file  Occupational History   Not on file  Social Needs   Financial resource strain: Not on file   Food insecurity:    Worry: Not on file    Inability: Not on file   Transportation needs:    Medical: Not on file    Non-medical: Not on file  Tobacco Use   Smoking status: Never Smoker   Smokeless tobacco: Never Used  Substance and Sexual Activity   Alcohol use: No    Alcohol/week: 0.0 standard drinks   Drug use: No   Sexual activity: Yes  Lifestyle   Physical activity:    Days per week: Not on file    Minutes per session: Not on file   Stress: Not on file  Relationships   Social connections:    Talks on phone: Not on file    Gets together: Not on file    Attends religious service: Not on file    Active member of club or organization: Not on file    Attends meetings of clubs or organizations: Not on file    Relationship status: Not on file   Intimate partner violence:    Fear of current or ex partner: Not on  file    Emotionally abused: Not on file    Physically abused: Not on file    Forced sexual activity: Not on file  Other Topics Concern   Not on file  Social History Narrative   Not on file    Family History:    Family History  Problem Relation Age of Onset   Asthma Mother    Diabetes Father    Hypertension Father      ROS:  Please see the history of present illness.  Review of Systems  Constitutional: Negative for malaise/fatigue.  Respiratory: Negative for shortness of breath and wheezing.   Cardiovascular: Positive for chest pain and palpitations. Negative for orthopnea and leg swelling.       No current chest pain.  10-minute episode of central, non-radiating, non-pleuritic, and 8/10 severity chest pain / pressure that occurred yesterday afternoon (6/3) after walking around his yard for exercise.  The pain occurred in the center of his chest and was associated with rapid heartbeat and "fluttering."  No further episodes of CP.  Gastrointestinal: Negative for abdominal pain, melena, nausea and vomiting.  Musculoskeletal: Negative for falls.  Neurological: Positive for tingling. Negative for dizziness, sensory change, speech change, focal weakness, loss of consciousness and weakness.       Left arm paresthesias at the time of rapid heart rate/palpitations/chest pain  All other systems reviewed and are negative.   All other ROS reviewed and negative.     Physical Exam/Data:   Vitals:   05/06/19 1900 05/06/19 1927 05/06/19 2023 05/07/19 0307  BP: (!) 133/54 (!) 163/86 (!) 143/69 (!) 106/42  Pulse: (!) 58 (!) 54 (!) 57 60  Resp:  13  18  Temp:   97.6 F (36.4 C) 98.3 F (36.8 C)  TempSrc:   Oral Oral  SpO2: 96% 100% 98% 93%  Weight:    120.1 kg  Height:        Intake/Output Summary (Last 24 hours) at 05/07/2019 0742 Last data filed at 05/07/2019 0656 Gross per 24 hour  Intake 1163.67 ml  Output 275 ml  Net 888.67 ml   Filed Weights   05/06/19 1210  05/07/19 0307   Weight: 114.3 kg 120.1 kg   Body mass index is 40.25 kg/m.  General: Obese male, sitting on edge of bed, no acute distress HEENT: normal Neck: JVD difficult to assess due to body habitus Vascular: No carotid bruits; radial pulse 1+ bilaterally   Cardiac:  normal S1, S2; RRR; no murmur  Lungs: Distant breath sounds, clear to auscultation bilaterally, no wheezing, rhonchi or rales  Abd: soft, nontender, no hepatomegaly  Ext: No bilateral lower extremity edema Musculoskeletal:  No deformities, BUE and BLE strength normal and equal Skin: warm and dry  Neuro:  CNs 2-12 intact, no focal abnormalities noted Psych:  Normal affect   EKG:  The EKG was personally reviewed and demonstrates: Atrial fibrillation with ventricular rate 155 bpm Telemetry:  Telemetry was personally reviewed and demonstrates: SR  Relevant CV Studies:  Pending updated echo  06/2018 TTE Study Conclusions - Procedure narrative: Transthoracic echocardiography. The study   was technically difficult. - Left ventricle: The cavity size was normal. Wall thickness was   increased in a pattern of mild LVH. Systolic function was mildly   reduced. The estimated ejection fraction was in the range of 45%   to 50%. - Mitral valve: There was mild regurgitation.   06/2018 LHC  Ost 2nd Mrg to 2nd Mrg lesion is 100% stenosed.  Ost LAD lesion is 99% stenosed.  Dist Cx lesion is 75% stenosed.  Prox LAD lesion is 100% stenosed.  Prox RCA lesion is 100% stenosed.  SVG and is very large.  The graft exhibits minimal luminal irregularities.  The flow is not reversed.  There is no competitive flow  Origin lesion is 100% stenosed.   1.  Severe three-vessel coronary artery disease with occluded mid LAD, occluded proximal RCA, occluded OM 2 2.  Patent LIMA to LAD 3.  Patent SVG to PDA 4.  Chronically occluded SVG to OM 2 5.  Mild to moderate his left ventricular function with LVEF 42% Recommendations 1.  Medical  therapy 2.  Add Plavix 3.  Uptitrate carvedilol 4.  Start isosorbide mononitrate 30 mg daily   Laboratory Data:  Chemistry Recent Labs  Lab 05/06/19 1211 05/07/19 0548  NA 140 141  K 4.7 4.1  CL 108 111  CO2 24 24  GLUCOSE 142* 97  BUN 31* 32*  CREATININE 1.56* 1.59*  CALCIUM 9.0 8.7*  GFRNONAA 42* 41*  GFRAA 48* 47*  ANIONGAP 8 6    Recent Labs  Lab 05/06/19 1211  PROT 6.9  ALBUMIN 3.8  AST 28  ALT 19  ALKPHOS 41  BILITOT 1.0   Hematology Recent Labs  Lab 05/06/19 1211 05/07/19 0548  WBC 11.3* 9.7  RBC 3.76* 3.65*  HGB 12.0* 11.6*  HCT 36.1* 35.5*  MCV 96.0 97.3  MCH 31.9 31.8  MCHC 33.2 32.7  RDW 13.0 12.9  PLT 178 151   Cardiac Enzymes Recent Labs  Lab 05/06/19 1211  TROPONINI 0.14*   No results for input(s): TROPIPOC in the last 168 hours.  BNPNo results for input(s): BNP, PROBNP in the last 168 hours.  DDimer No results for input(s): DDIMER in the last 168 hours.  Radiology/Studies:  Dg Chest Port 1 View  Result Date: 05/06/2019 CLINICAL DATA:  Chest pain for approximately 2 hours. EXAM: PORTABLE CHEST 1 VIEW COMPARISON:  Single-view of the chest 05/31/2018. FINDINGS: That would the patient is status post CABG. There is cardiomegaly. Atherosclerosis noted. Lungs are clear. No pneumothorax or pleural effusion. IMPRESSION: No acute  disease. Cardiomegaly. Atherosclerosis. Electronically Signed   By: Inge Rise M.D.   On: 05/06/2019 12:30    Assessment and Plan:   NSTEMI with h/o CAD, CABG (1995), NSTEMI (2019) - No current chest pain.  Earlier chest pain as above in HPI, described as central 8/10 in severity and pressure with left arm paresthesias.  - History of CAD with 1995 CABG and 05/2018 NSTEMI as above in HPI.  06/2018 cardiac catheterization copied and pasted above in CV studies and showing severe three-vessel CAD with occluded mid LAD, occluded proximal RCA, occluded OM 2.  Patent LIMA-LAD, patent SVG-PDA, chronically occluded SVG-OM  2.  Mild to moderate left ventricular function with estimated LVEF 42%.  Recommendations at that time were for medical therapy with the addition of Plavix and up titration of carvedilol.  He was also started on Imdur at that time. - Troponin 0.14  53.20.  Continue to cycle until peaked and downtrending. - Repeat EKG pending. Previous EKG showed atrial fibrillation with ventricular rate of 155 bpm. Sinus pauses documented in ED but not available per review of EMR in order to quantify length of pause. - Pending ordered TSH, A1c for further risk stratification. --Pending updated echo. - Given earlier chest pain, significant history of cardiac disease, and comorbid conditions, further ischemic work-up with cardiac catheterization +/- intervention is recommended at this time.  - NPO in preparation for catheterization.   - Consented to cath. The patient understands that risks of catheterization included but are not limited to stroke (1 in 1000), death (1 in 42), kidney failure [usually temporary] (1 in 500), bleeding (1 in 200), allergic reaction [possibly serious] (1 in 200).   -Daily BMET to monitor renal function and electrolytes. K 4.1, Cr 1.59 (and at baseline), BUN 32. Hgb 12.0  11.6. -Further recommendations following cardiac catheterization and results of echocardiogram.  New onset atrial fibrillation with RVR - New onset afib and converted to SR with Cardizem IV boluses - Symptomatic when was in Afib with feelings of rapid heart rate, palpitations - Initial EKG showed atrial fibrillation with ventricular rate of 155 bpm.  Pending updated EKG, given report of long sinus pauses while in the ED.   --Unable to access the telemetry records to quantify length of pauses in the ED and not available per review of EMR under the CV strips tab. - CHA2DS2VASc score of at least 5 (CHF, HTN, agex2, CAD) with recommendation for anticoagulation to prevent thrombotic event. - Pending cardiac catheterization and  therapeutically anticoagulated at this time.  Will need to discuss plan for long term oral anticoagulation before discharge.  PTA medications include DAPT with aspirin and Plavix, following his 2019 cardiac catheterization.  If start long-term anticoagulation, will likely stop ASA at that time to avoid triple therapy. -- Continue coreg for rate control.  --Pending echo as above.  -- In future, may consider Zio as an outpatient to assess Afib burden.  HTN - Continue coreg, benazepril, Imdur.  CKDIII - Daily BMET as above.    For questions or updates, please contact [place] Please consult [email or web address] for contact info under     Signed, Arvil Chaco, PA-C  05/07/2019 7:42 AM

## 2019-05-07 NOTE — Progress Notes (Signed)
ANTICOAGULATION CONSULT NOTE - Initial Consult  Pharmacy Consult for Heparin Indication: chest pain/ACS  Allergies  Allergen Reactions  . Penicillins Rash and Other (See Comments)    Has patient had a PCN reaction causing immediate rash, facial/tongue/throat swelling, SOB or lightheadedness with hypotension: No Has patient had a PCN reaction causing severe rash involving mucus membranes or skin necrosis: No Has patient had a PCN reaction that required hospitalization: No Has patient had a PCN reaction occurring within the last 10 years: No If all of the above answers are "NO", then may proceed with Cephalosporin use.     Patient Measurements: Height: 5' 8"  (172.7 cm) Weight: 252 lb (114.3 kg) IBW/kg (Calculated) : 68.4 Heparin Dosing Weight: 94.1 kg  Vital Signs: Temp: 97.6 F (36.4 C) (06/03 2023) Temp Source: Oral (06/03 2023) BP: 143/69 (06/03 2023) Pulse Rate: 57 (06/03 2023)  Labs: Recent Labs    05/06/19 1211 05/06/19 1332 05/06/19 2207  HGB 12.0*  --   --   HCT 36.1*  --   --   PLT 178  --   --   APTT  --  34  --   LABPROT  --  13.5  --   INR  --  1.0  --   HEPARINUNFRC  --   --  0.51  CREATININE 1.56*  --   --   TROPONINI 0.14*  --   --     Estimated Creatinine Clearance: 47.1 mL/min (A) (by C-G formula based on SCr of 1.56 mg/dL (H)).   Medical History: Past Medical History:  Diagnosis Date  . Arthritis   . CAD (coronary artery disease)   . CKD (chronic kidney disease), stage III ([place])   . Gout   . Hernia, abdominal   . Hypertension   . Myocardial infarction [facility])      Assessment: 78 yo male here with chest pain and AFib with RVR to start on heparin drip. No oral anticoagulants noted on PTA med list.    Goal of Therapy:  Heparin level 0.3-0.7 units/ml Monitor platelets by anticoagulation protocol: Yes   Plan:  06/03 @ 2200 HL 0.51 therapeutic. Will continue current rate and will recheck HL w/ am labs.  Pharmacy will continue to follow.    Tobie Lords, PharmD, BCPS Clinical Pharmacist 05/07/2019,12:13 AM

## 2019-05-07 NOTE — Progress Notes (Signed)
*  PRELIMINARY RESULTS* Echocardiogram 2D Echocardiogram has been performed.  Jeff Henderson 05/07/2019, 11:18 AM

## 2019-05-07 NOTE — Progress Notes (Signed)
[place] [facility]                                                                                                                                                                                  Patient Demographics   Jeff Henderson, is a 78 y.o. male, DOB - [birth year], BSW:[id number]  Admit date - 05/06/2019   Admitting Physician Hillary Bow, MD  Outpatient Primary MD for the patient is [name], MD   LOS - 0  Subjective: Patient presents with chest pain.  No further chest pain now has converted to normal sinus rhythm.  Patient's troponin now is noted to be 53.20.    Review of Systems:   CONSTITUTIONAL: No documented fever. No fatigue, weakness. No weight gain, no weight loss.  EYES: No blurry or double vision.  ENT: No tinnitus. No postnasal drip. No redness of the oropharynx.  RESPIRATORY: No cough, no wheeze, no hemoptysis. No dyspnea.  CARDIOVASCULAR: No chest pain. No orthopnea. No palpitations. No syncope.  GASTROINTESTINAL: No nausea, no vomiting or diarrhea. No abdominal pain. No melena or hematochezia.  GENITOURINARY: No dysuria or hematuria.  ENDOCRINE: No polyuria or nocturia. No heat or cold intolerance.  HEMATOLOGY: No anemia. No bruising. No bleeding.  INTEGUMENTARY: No rashes. No lesions.  MUSCULOSKELETAL: No arthritis. No swelling. No gout.  NEUROLOGIC: No numbness, tingling, or ataxia. No seizure-type activity.  PSYCHIATRIC: No anxiety. No insomnia. No ADD.    Vitals:   Vitals:   05/06/19 1927 05/06/19 2023 05/07/19 0307 05/07/19 0746  BP: (!) 163/86 (!) 143/69 (!) 106/42 (!) 142/78  Pulse: (!) 54 (!) 57 60 64  Resp: 13  18 17   Temp:  97.6 F (36.4 C) 98.3 F (36.8 C) 98.1 F (36.7 C)  TempSrc:  Oral Oral Oral  SpO2: 100% 98% 93% 95%  Weight:   120.1 kg   Height:        Wt Readings from Last 3 Encounters:  05/07/19 120.1 kg  03/04/19 109.3 kg  12/12/18 118.4 kg     Intake/Output Summary (Last 24 hours)  at 05/07/2019 1301 Last data filed at 05/07/2019 0900 Gross per 24 hour  Intake 1403.67 ml  Output 275 ml  Net 1128.67 ml    Physical Exam:   GENERAL: Pleasant-appearing in no apparent distress.  HEAD, EYES, EARS, NOSE AND THROAT: Atraumatic, normocephalic. Extraocular muscles are intact. Pupils equal and reactive to light. Sclerae anicteric. No conjunctival injection. No oro-pharyngeal erythema.  NECK: Supple. There is no jugular venous distention. No bruits, no lymphadenopathy, no thyromegaly.  HEART: Regular rate and rhythm,. No murmurs, no rubs, no clicks.  LUNGS: Clear to auscultation bilaterally.  No rales or rhonchi. No wheezes.  ABDOMEN: Soft, flat, nontender, nondistended. Has good bowel sounds. No hepatosplenomegaly appreciated.  EXTREMITIES: No evidence of any cyanosis, clubbing, or peripheral edema.  +2 pedal and radial pulses bilaterally.  NEUROLOGIC: The patient is alert, awake, and oriented x3 with no focal motor or sensory deficits appreciated bilaterally.  SKIN: Moist and warm with no rashes appreciated.  Psych: Not anxious, depressed LN: No inguinal LN enlargement    Antibiotics   Anti-infectives (From admission, onward)   None      Medications   Scheduled Meds: . [START ON 05/08/2019] aspirin  81 mg Oral Pre-Cath  . aspirin EC  81 mg Oral Daily  . atorvastatin  80 mg Oral Daily  . benazepril  40 mg Oral Daily  . carvedilol  25 mg Oral BID WC  . clopidogrel  75 mg Oral Daily  . fluticasone  1 spray Each Nare BID  . isosorbide mononitrate  60 mg Oral Daily  . sodium chloride flush  3 mL Intravenous Q12H   Continuous Infusions: . sodium chloride    . heparin 1,200 Units/hr (05/07/19 0726)   PRN Meds:.acetaminophen **OR** acetaminophen, albuterol, diltiazem, ondansetron **OR** ondansetron (ZOFRAN) IV, polyethylene glycol   Data Review:   Micro Results Recent Results (from the past 240 hour(s))  SARS Coronavirus 2 (CEPHEID - Performed in [place]  hospital lab), Hosp Order     Status: None   Collection Time: 05/06/19 12:21 PM  Result Value Ref Range Status   SARS Coronavirus 2 NEGATIVE NEGATIVE Final    Comment: (NOTE) If result is NEGATIVE SARS-CoV-2 target nucleic acids are NOT DETECTED. The SARS-CoV-2 RNA is generally detectable in upper and lower  respiratory specimens during the acute phase of infection. The lowest  concentration of SARS-CoV-2 viral copies this assay can detect is 250  copies / mL. A negative result does not preclude SARS-CoV-2 infection  and should not be used as the sole basis for treatment or other  patient management decisions.  A negative result may occur with  improper specimen collection / handling, submission of specimen other  than nasopharyngeal swab, presence of viral mutation(s) within the  areas targeted by this assay, and inadequate number of viral copies  (<250 copies / mL). A negative result must be combined with clinical  observations, patient history, and epidemiological information. If result is POSITIVE SARS-CoV-2 target nucleic acids are DETECTED. The SARS-CoV-2 RNA is generally detectable in upper and lower  respiratory specimens dur ing the acute phase of infection.  Positive  results are indicative of active infection with SARS-CoV-2.  Clinical  correlation with patient history and other diagnostic information is  necessary to determine patient infection status.  Positive results do  not rule out bacterial infection or co-infection with other viruses. If result is PRESUMPTIVE POSTIVE SARS-CoV-2 nucleic acids MAY BE PRESENT.   A presumptive positive result was obtained on the submitted specimen  and confirmed on repeat testing.  While 2019 novel coronavirus  (SARS-CoV-2) nucleic acids may be present in the submitted sample  additional confirmatory testing may be necessary for epidemiological  and / or clinical management purposes  to differentiate between  SARS-CoV-2 and other  Sarbecovirus currently known to infect humans.  If clinically indicated additional testing with an alternate test  methodology [phone]) is advised. The SARS-CoV-2 RNA is generally  detectable in upper and lower respiratory sp ecimens during the acute  phase of infection. The expected result is Negative. Fact Sheet for Patients:  StrictlyIdeas.no Fact Sheet for Healthcare Providers: BankingDealers.co.za This test is not yet approved or cleared by the [place] FDA and has been authorized for detection and/or diagnosis of SARS-CoV-2 by FDA under an Emergency Use Authorization (EUA).  This EUA will remain in effect (meaning this test can be used) for the duration of the COVID-19 declaration under Section 564(b)(1) of the Act, 21 U.S.C. section 360bbb-3(b)(1), unless the authorization is terminated or revoked sooner. Performed at [facility], [address] [place], [place] [address]     Radiology Reports Dg Chest [place] 1 View  Result Date: 05/06/2019 CLINICAL DATA:  Chest pain for approximately 2 hours. EXAM: PORTABLE CHEST 1 VIEW COMPARISON:  Single-view of the chest 05/31/2018. FINDINGS: That would the patient is status post CABG. There is cardiomegaly. Atherosclerosis noted. Lungs are clear. No pneumothorax or pleural effusion. IMPRESSION: No acute disease. Cardiomegaly. Atherosclerosis. Electronically Signed   By: Inge Rise M.D.   On: 05/06/2019 12:30     CBC Recent Labs  Lab 05/06/19 1211 05/07/19 0548  WBC 11.3* 9.7  HGB 12.0* 11.6*  HCT 36.1* 35.5*  PLT 178 151  MCV 96.0 97.3  MCH 31.9 31.8  MCHC 33.2 32.7  RDW 13.0 12.9    Chemistries  Recent Labs  Lab 05/06/19 1211 05/07/19 0548  NA 140 141  K 4.7 4.1  CL 108 111  CO2 24 24  GLUCOSE 142* 97  BUN 31* 32*  CREATININE 1.56* 1.59*  CALCIUM 9.0 8.7*  AST 28  --   ALT 19  --   ALKPHOS 41  --   BILITOT 1.0  --     ------------------------------------------------------------------------------------------------------------------ estimated creatinine clearance is 47.5 mL/min (A) (by C-G formula based on SCr of 1.59 mg/dL (H)). ------------------------------------------------------------------------------------------------------------------ No results for input(s): HGBA1C in the last 72 hours. ------------------------------------------------------------------------------------------------------------------ No results for input(s): CHOL, HDL, LDLCALC, TRIG, CHOLHDL, LDLDIRECT in the last 72 hours. ------------------------------------------------------------------------------------------------------------------ Recent Labs    05/07/19 1151  TSH 1.629   ------------------------------------------------------------------------------------------------------------------ No results for input(s): VITAMINB12, FOLATE, FERRITIN, TIBC, IRON, RETICCTPCT in the last 72 hours.  Coagulation profile Recent Labs  Lab 05/06/19 1332  INR 1.0    No results for input(s): DDIMER in the last 72 hours.  Cardiac Enzymes Recent Labs  Lab 05/06/19 1211 05/07/19 0904 05/07/19 1151  TROPONINI 0.14* 53.20* 36.81*   ------------------------------------------------------------------------------------------------------------------ Invalid input(s): POCBNP    Assessment & Plan   Patient 78 year old with known coronary artery disease  *Non-ST MI continue heparin drip Patient with complex coronary artery disease Plan for cardiac catheterization later today Continue aspirin Continue Lipitor Continue Coreg Continue Imdur  *New onset atrial fibrillation.   Continue Coreg Per cardiology patient will need Eliquis on discharge   *CAD.  Mild elevation in troponin likely due to demand from rapid ventricular rate.  His chest pain has resolved once he is in normal sinus rhythm.  Will repeat troponin.  I do not expect  any further work-up at this point.  *Hypertension.    Continue Coreg benazepril and Imdur  *CKD stage III is stable  DVT prophylaxis.  On heparin drip.       Code Status Orders  (From admission, onward)         Start     Ordered   05/06/19 1528  Do not attempt resuscitation (DNR)  Continuous    Question Answer Comment  In the event of cardiac or respiratory ARREST Do not call a "code blue"   In the event of cardiac or  respiratory ARREST Do not perform Intubation, CPR, defibrillation or ACLS   In the event of cardiac or respiratory ARREST Use medication by any route, position, wound care, and other measures to relive pain and suffering. May use oxygen, suction and manual treatment of airway obstruction as needed for comfort.      05/06/19 1528        Code Status History    Date Active Date Inactive Code Status Order ID Comments User Context   06/03/2018 0855 06/03/2018 1928 Full Code [id number]  Isaias Cowman, MD Inpatient   06/01/2018 0007 06/03/2018 0855 Full Code [id number]  Lance Coon, MD Inpatient    Advance Directive Documentation     Most Recent Value  Type of Advance Directive  Living will  Pre-existing out of facility DNR order (yellow form or pink MOST form)  -  "MOST" Form in Place?  -           Consults cardiology  DVT Prophylaxis Heparin  Lab Results  Component Value Date   PLT 151 05/07/2019     Time Spent in minutes   38mn  Greater than 50% of time spent in care coordination and counseling patient regarding the condition and plan of care.   SDustin FlockM.D on 05/07/2019 at 1:01 PM  Between 7am to 6pm - Pager - [phone]  After 6pm go to [email or web address] - pProofreader [employer]   Office  [phone]

## 2019-05-07 NOTE — Progress Notes (Signed)
Cardiac catheterization Patent vein graft to the PDA/OM Patent LIMA to the LAD Known severe three-vessel disease, occluded mid LAD, occluded left circumflex in the mid region, occluded proximal RCA  No significant change from catheterization July 2019  Etiology of elevated troponin/non-STEMI likely secondary to occluded small vessel, in the setting of atrial fibrillation with RVR, renal dysfunction  We will continue to monitor overnight Difficulty obtaining access during catheterization right groin needs to be closely monitored  Will need post catheterization IV fluids given renal dysfunction Plan will be to start Eliquis and Plavix tomorrow  Signed, Esmond Plants, MD, Ph.D [facility]

## 2019-05-07 NOTE — Progress Notes (Signed)
ANTICOAGULATION CONSULT NOTE - Initial Consult  Pharmacy Consult for Heparin Indication: chest pain/ACS  Allergies  Allergen Reactions  . Penicillins Rash and Other (See Comments)    Has patient had a PCN reaction causing immediate rash, facial/tongue/throat swelling, SOB or lightheadedness with hypotension: No Has patient had a PCN reaction causing severe rash involving mucus membranes or skin necrosis: No Has patient had a PCN reaction that required hospitalization: No Has patient had a PCN reaction occurring within the last 10 years: No If all of the above answers are "NO", then may proceed with Cephalosporin use.     Patient Measurements: Height: 5' 8"  (172.7 cm) Weight: 264 lb 11.2 oz (120.1 kg) IBW/kg (Calculated) : 68.4 Heparin Dosing Weight: 94.1 kg  Vital Signs: Temp: 98.3 F (36.8 C) (06/04 0307) Temp Source: Oral (06/04 0307) BP: 106/42 (06/04 0307) Pulse Rate: 60 (06/04 0307)  Labs: Recent Labs    05/06/19 1211 05/06/19 1332 05/06/19 2207 05/07/19 0548  HGB 12.0*  --   --  11.6*  HCT 36.1*  --   --  35.5*  PLT 178  --   --  151  APTT  --  34  --   --   LABPROT  --  13.5  --   --   INR  --  1.0  --   --   HEPARINUNFRC  --   --  0.51 0.58  CREATININE 1.56*  --   --  1.59*  TROPONINI 0.14*  --   --   --     Estimated Creatinine Clearance: 47.5 mL/min (A) (by C-G formula based on SCr of 1.59 mg/dL (H)).   Medical History: Past Medical History:  Diagnosis Date  . Arthritis   . CAD (coronary artery disease)   . CKD (chronic kidney disease), stage III ([place])   . Gout   . Hernia, abdominal   . Hypertension   . Myocardial infarction [facility])      Assessment: 78 yo male here with chest pain and AFib with RVR to start on heparin drip. No oral anticoagulants noted on PTA med list.    Started on heparin 1200 units/hr + heparin 4000 unit x 1.  6/3 @2207  HL 0.51  6/4 @0548  HL 0.58   Goal of Therapy:  Heparin level 0.3-0.7 units/ml Monitor platelets by  anticoagulation protocol: Yes   Plan:  Heparin level therapeutic x 2. Will continue current rate and will recheck HL w/ am labs. CBC stable. Will order CBC daily while pt is on heparin.   Pharmacy will continue to follow.   Eleonore Chiquito, PharmD, BCPS Clinical Pharmacist 05/07/2019,7:11 AM

## 2019-05-08 ENCOUNTER — Other Ambulatory Visit: Payer: Self-pay | Admitting: Physician Assistant

## 2019-05-08 ENCOUNTER — Encounter: Payer: Self-pay | Admitting: Cardiovascular Disease

## 2019-05-08 DIAGNOSIS — I420 Dilated cardiomyopathy: Secondary | ICD-10-CM

## 2019-05-08 DIAGNOSIS — R079 Chest pain, unspecified: Secondary | ICD-10-CM

## 2019-05-08 DIAGNOSIS — I2510 Atherosclerotic heart disease of native coronary artery without angina pectoris: Secondary | ICD-10-CM

## 2019-05-08 LAB — CBC
HCT: 37.8 % — ABNORMAL LOW (ref 39.0–52.0)
Hemoglobin: 12.1 g/dL — ABNORMAL LOW (ref 13.0–17.0)
MCH: 31.2 pg (ref 26.0–34.0)
MCHC: 32 g/dL (ref 30.0–36.0)
MCV: 97.4 fL (ref 80.0–100.0)
Platelets: 150 10*3/uL (ref 150–400)
RBC: 3.88 MIL/uL — ABNORMAL LOW (ref 4.22–5.81)
RDW: 12.9 % (ref 11.5–15.5)
WBC: 7.8 10*3/uL (ref 4.0–10.5)
nRBC: 0 % (ref 0.0–0.2)

## 2019-05-08 LAB — BASIC METABOLIC PANEL
Anion gap: 9 (ref 5–15)
BUN: 32 mg/dL — ABNORMAL HIGH (ref 8–23)
CO2: 23 mmol/L (ref 22–32)
Calcium: 8.9 mg/dL (ref 8.9–10.3)
Chloride: 109 mmol/L (ref 98–111)
Creatinine, Ser: 1.63 mg/dL — ABNORMAL HIGH (ref 0.61–1.24)
GFR calc Af Amer: 46 mL/min — ABNORMAL LOW (ref >60)
GFR calc non Af Amer: 39 mL/min — ABNORMAL LOW (ref >60)
Glucose, Bld: 96 mg/dL (ref 70–99)
Potassium: 4.2 mmol/L (ref 3.5–5.1)
Sodium: 141 mmol/L (ref 135–145)

## 2019-05-08 LAB — TROPONIN I: Troponin I: 28.01 ng/mL (ref <0.03)

## 2019-05-08 LAB — HEPARIN LEVEL (UNFRACTIONATED): Heparin Unfractionated: <0.1 IU/mL — ABNORMAL LOW (ref 0.30–0.70)

## 2019-05-08 MED ORDER — NITROGLYCERIN 0.4 MG SL SUBL: 0.4000 mg | SUBLINGUAL_TABLET | SUBLINGUAL | 3 refills | Status: AC | PRN

## 2019-05-08 MED ORDER — APIXABAN 5 MG PO TABS: 5.0000 mg | ORAL_TABLET | Freq: Two times a day (BID) | ORAL | 2 refills | Status: DC

## 2019-05-08 MED ORDER — CLOPIDOGREL BISULFATE 75 MG PO TABS: 75.0000 mg | ORAL_TABLET | Freq: Every day | ORAL | Status: DC

## 2019-05-08 MED ORDER — FUROSEMIDE 20 MG PO TABS: 20.0000 mg | ORAL_TABLET | Freq: Every day | ORAL | Status: DC

## 2019-05-08 MED ORDER — NITROGLYCERIN 0.4 MG SL SUBL: 0.4000 mg | SUBLINGUAL_TABLET | SUBLINGUAL | Status: DC | PRN

## 2019-05-08 MED ORDER — FUROSEMIDE 20 MG PO TABS: 20.0000 mg | ORAL_TABLET | Freq: Every day | ORAL | 0 refills | Status: DC

## 2019-05-08 MED ORDER — APIXABAN 5 MG PO TABS
5.0000 mg | ORAL_TABLET | Freq: Two times a day (BID) | ORAL | Status: DC
  Administered 2019-05-08: 5 mg via ORAL
  Filled 2019-05-08: qty 1

## 2019-05-08 MED ORDER — CARVEDILOL 12.5 MG PO TABS: 12.5000 mg | ORAL_TABLET | Freq: Two times a day (BID) | ORAL | 0 refills | Status: DC

## 2019-05-08 NOTE — Progress Notes (Signed)
Discharge Ed about Eliquis, Cath Fem site care, meds given to the patient

## 2019-05-08 NOTE — Care Management Obs Status (Signed)
[place] NOTIFICATION   Patient Details  Name: Jeff Henderson MRN: [id number] Date of Birth: 10-17-1941   Medicare Observation Status Notification Given:  No Admitted in less than 24 hours of being placed in observation   Katrina Stack, RN 05/08/2019, 7:46 AM

## 2019-05-08 NOTE — TOC Benefit Eligibility Note (Signed)
Transition of Care [facility]) Benefit Eligibility Note    Patient Details  Name: Jeff Henderson MRN: [id number] Date of Birth: 1941-02-25   Medication/Dose:  Eliquis 5 mg 2 xa day for 30 days  Covered?: Yes  Tier: 2 Drug  Prescription Coverage Preferred Pharmacy: [facility]  Rx was filled earlier today  Spoke with Person/Company/Phone Number:: [employer] [place] [facility] [phone]  Co-Pay: $15.00  Prior Approval: No  Deductible: Met       Kerin Salen Phone Number: 05/08/2019, 11:37 AM

## 2019-05-08 NOTE — Progress Notes (Signed)
Progress Note  Patient Name: Jeff Henderson Date of Encounter: 05/08/2019  Primary Cardiologist: Fletcher Anon  Subjective   Feels well this morning, no complaints Denies significant shortness of breath Sedentary at baseline No significant hematoma right groin No chest pain, no further arrhythmia on telemetry   Inpatient Medications    Scheduled Meds: . apixaban  5 mg Oral BID  . atorvastatin  80 mg Oral Daily  . benazepril  40 mg Oral Daily  . carvedilol  25 mg Oral BID WC  . [START ON 05/09/2019] clopidogrel  75 mg Oral Daily  . fluticasone  1 spray Each Nare BID  . isosorbide mononitrate  60 mg Oral Daily  . sodium chloride flush  3 mL Intravenous Q12H  . sodium chloride flush  3 mL Intravenous Q12H   Continuous Infusions: . sodium chloride     PRN Meds: sodium chloride, acetaminophen **OR** acetaminophen, albuterol, diltiazem, nitroGLYCERIN, ondansetron **OR** ondansetron (ZOFRAN) IV, polyethylene glycol, sodium chloride flush   Vital Signs    Vitals:   05/07/19 1954 05/08/19 0514 05/08/19 0641 05/08/19 0736  BP: (!) 126/55 (!) 142/66  (!) 155/73  Pulse: (!) 53 (!) 59  60  Resp: 20 20  17   Temp: (!) 97.5 F (36.4 C) 97.7 F (36.5 C)  97.6 F (36.4 C)  TempSrc: Oral Oral  Oral  SpO2: 95% 94%  94%  Weight:   119.5 kg   Height:        Intake/Output Summary (Last 24 hours) at 05/08/2019 1054 Last data filed at 05/07/2019 1900 Gross per 24 hour  Intake 164.35 ml  Output -  Net 164.35 ml   Last 3 Weights 05/08/2019 05/07/2019 05/06/2019  Weight (lbs) 263 lb 6.4 oz 264 lb 11.2 oz 252 lb  Weight (kg) 119.477 kg 120.067 kg 114.306 kg      Telemetry    Normal sinus rhythm- Personally Reviewed  ECG     - Personally Reviewed  Physical Exam   GEN: No acute distress.  Morbidly obese Neck: No JVD Cardiac: RRR, no murmurs, rubs, or gallops.  Respiratory: Clear to auscultation bilaterally. GI: Soft, nontender, non-distended  MS: No edema; No deformity. Neuro:  Nonfocal   Psych: Normal affect   Labs    Chemistry Recent Labs  Lab 05/06/19 1211 05/07/19 0548 05/08/19 0521  NA 140 141 141  K 4.7 4.1 4.2  CL 108 111 109  CO2 24 24 23   GLUCOSE 142* 97 96  BUN 31* 32* 32*  CREATININE 1.56* 1.59* 1.63*  CALCIUM 9.0 8.7* 8.9  PROT 6.9  --   --   ALBUMIN 3.8  --   --   AST 28  --   --   ALT 19  --   --   ALKPHOS 41  --   --   BILITOT 1.0  --   --   GFRNONAA 42* 41* 39*  GFRAA 48* 47* 46*  ANIONGAP 8 6 9      Hematology Recent Labs  Lab 05/06/19 1211 05/07/19 0548 05/08/19 0521  WBC 11.3* 9.7 7.8  RBC 3.76* 3.65* 3.88*  HGB 12.0* 11.6* 12.1*  HCT 36.1* 35.5* 37.8*  MCV 96.0 97.3 97.4  MCH 31.9 31.8 31.2  MCHC 33.2 32.7 32.0  RDW 13.0 12.9 12.9  PLT 178 151 150    Cardiac Enzymes Recent Labs  Lab 05/07/19 0904 05/07/19 1151 05/07/19 1833 05/07/19 2324  TROPONINI 53.20* 36.81* 35.98* 28.01*   No results for input(s): TROPIPOC in the last  168 hours.   BNPNo results for input(s): BNP, PROBNP in the last 168 hours.   DDimer No results for input(s): DDIMER in the last 168 hours.   Radiology    Dg Chest Port 1 View  Result Date: 05/06/2019 CLINICAL DATA:  Chest pain for approximately 2 hours. EXAM: PORTABLE CHEST 1 VIEW COMPARISON:  Single-view of the chest 05/31/2018. FINDINGS: That would the patient is status post CABG. There is cardiomegaly. Atherosclerosis noted. Lungs are clear. No pneumothorax or pleural effusion. IMPRESSION: No acute disease. Cardiomegaly. Atherosclerosis. Electronically Signed   By: Inge Rise M.D.   On: 05/06/2019 12:30    Cardiac Studies   Echo  1. The left ventricle has moderate-severely reduced systolic function, with an ejection fraction of 30-35%. The cavity size was mildly dilated. Left ventricular diffuse hypokinesis, anterior wall motion best preserved.  2. The right ventricle has normal systolic function. The cavity was normal. There is no increase in right ventricular wall thickness.Unable  to estimate RVSP  3. Left atrial size was moderately dilated.  4. Challenging image quality.  5. The tricuspid valve is grossly normal.   Cath: Patent vein graft to the PDA/OM Patent LIMA to the LAD Known severe three-vessel disease, occluded mid LAD, occluded left circumflex in the mid region, occluded proximal RCA  No significant change from catheterization July 2019  Patient Profile     78 year old gentleman with history of coronary artery disease, bypass surgery 1995, non-STEMI June 2019 with cardiac catheterization at that time showing severe three-vessel disease occluded mid LAD occluded proximal RCA occluded OM 2 with patent LIMA to the LAD vein graft to the PDA Chronically occluded vein graft to the OM 2 ejection fraction 42% Who presents for chest tightness, tachycardia, noted to be in atrial fibrillation  Assessment & Plan    Non-STEMI Markedly elevated troponin on recheck up to 50 Cardiac catheterization showing patent graft to the PDA/OM Patent LIMA graft Otherwise severe three-vessel coronary disease, no significant change compared to July 2020.  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 , unidentified  medical management -Continue Plavix, hold aspirin Lipitor 80   Atrial fibrillation with RVR Elevated CHADS VASC, Carvedilol dose increased 25 twice daily Eliquis 5 twice daily started  Chronic kidney disease Stable  Pulmonary edema/chronic diastolic and systolic CHF Elevated left ventricular end-diastolic pressure on catheterization He would likely benefit from Lasix 20 daily  Cardiomyopathy Drop in ejection fraction which was 45 to 50% July 2019 now down to 30 to 35% Continue Coreg, benazepril Imdur Add Lasix 20 daily   Total encounter time more than 25 minutes  Greater than 50% was spent in counseling and coordination of care with the patient   For questions or updates, please contact [place] Please consult [email or web address] for contact  info under        Signed, Ida Rogue, MD  05/08/2019, 10:54 AM

## 2019-05-08 NOTE — TOC Transition Note (Signed)
Transition of Care [facility]) - CM/SW Discharge Note   Patient Details  Name: Jeff Henderson MRN: [id number] Date of Birth: Jul 25, 1941  Transition of Care [facility]) CM/SW Contact:  Katrina Stack, RN Phone Number: 05/08/2019, 10:34 AM   Clinical Narrative:   No discharge needs identified by members of the care team. Discharging home on new Eliquis.  Verbally confirms part D coverage. .  Provided with coupon for 30 day trial. Reached out to CM assistance for copay information          Patient Goals and CMS Choice        Discharge Placement                       Discharge Plan and Services                                     Social Determinants of Health (SDOH) Interventions     Readmission Risk Interventions No flowsheet data found.

## 2019-05-08 NOTE — Progress Notes (Addendum)
Cardiac Rehab Navigator/ Exercise Physiologist Note  "Heart Attack Bouncing Back" booklet given and reviewed with patient. Discussed the definition of CAD.  This EP discussed modifiable risk factors including controlling blood pressure, cholesterol, and blood sugar; following heart healthy diet; maintaining healthy weight; exercise; and smoking cessation, not applicable. Patient takes blood pressure at home once per week.  Discussed cardiac medications including rationale for taking, mechanisms of action, and side effects. Stressed the importance of taking medications as prescribed.  Discussed emergency plan for heart attack symptoms. Patient verbalized understanding of need to call 911 and not to drive himself to ER if having cardiac symptoms / chest pain.  Diet of low sodium, low fat, low cholesterol heart healthy diet discussed. Information on diet provided.   Smoking Cessation - Patient is a NEVER smoker.  Exercise - Benefits of exercised discussed. Patient works out regularly at [employer], has been walking at home during COVID-19.  Patient does not use an assistive device. Informed patient his cardiologist has referred him to outpatient Cardiac Rehab. An overview of the program was provided. Brochure, informational letter, class and orientation times, and CPT billing codes given to patient. Patient is interested in participating. Patient plans to check with his insurance company to see what his out-of-pocket expenses will be. Patient informed the Cardiac Rehab department is currently closed due to the COVID-19 pandemic. The Cardiac Rehab dept will contact patient as soon as the department reopens. Patient has attended Cardiac Rehab before at [facility] after CABG.  Patient appreciative of the information.   Confirm Consent - "In the setting of the current Covid19 crisis, you are scheduled to join our "At Home" [facility]  Cardiac or Pulmonary  Rehab program . Just as we do with many in-gym visits, in  order for you to participate in this program, we must obtain consent.  If you'd like, I can send this to your mychart (if signed up) or email for you to review.  Otherwise, I can obtain your verbal consent now.  By agreeing to a Cardiac or Pulmonary Rehab Telehealth visit, we'd like you to understand that the technology does not allow for your Cardiac or Pulmonary Rehab team member to perform a physical assessment, and thus may limit their ability to fully assess your ability to perform exercise programs. If your provider identifies any concerns that need to be evaluated in person, we will make arrangements to do so.  Finally, though the technology is pretty good, we cannot assure that it will always work on either your or our end and we cannot ensure that we have a secure connection.  Cardiac and Pulmonary Rehab Telehealth visits and "At Home" cardiac and pulmonary rehab are provided at no cost to you. Are you willing to proceed?" STAFF: Did the patient verbally acknowledge consent to telehealth visit? Document YES  here: 05/15/19 10am  [phone] Patient does not want to use the [facility] [employer] App but would like to be followed by phone.       Staff completing consent process:     Jasper Loser, [facility]  Exercise Physiologist Department Phone #: [phone] Fax: [phone]   Direct Line [phone] Email Address: [email or web address]

## 2019-05-08 NOTE — Discharge Summary (Signed)
[facility] [facility] [place] at [facility], 78 y.o., DOB 06-13-1941, MRN [id number]. Admission date: 05/06/2019 Discharge Date 05/08/2019 Primary MD [name], MD Admitting Physician Hillary Bow, MD  Admission Diagnosis  Unstable angina [facility]) [I20.0] Atrial fibrillation with rapid ventricular response (HCC) [I48.91] Sinus pause [I45.5]  Discharge Diagnosis   Active Problems: None ST MI A-fib ([place]) RVR Coronary artery disease Hypertension Chronic kidney disease stage III      Hospital Course  Patient with known coronary artery disease with chronic obstructive disease who presented to the hospital with complaint of chest pain.  He was noted to be in A. fib with RVR.  Patient also was noted to have a troponin that was very high.  He was seen by cardiology and underwent a cardiac catheterization.  Cardiac cath showed chronically stable coronary artery disease.  His heart rate is much improved.  Patient is started on Eliquis.    Please note patient per cardiology recommendation is being discharged on Plavix and Eliquis for underlying coronary artery disease and atrial fibrillation.  Aspirin has been discontinued due to high risk of bleeding with 3 anticoagulants.  This was recommended by cardiology.        Consults  cardiology  Significant Tests:  See full reports for all details    Dg Chest Port 1 View  Result Date: 05/06/2019 CLINICAL DATA:  Chest pain for approximately 2 hours. EXAM: PORTABLE CHEST 1 VIEW COMPARISON:  Single-view of the chest 05/31/2018. FINDINGS: That would the patient is status post CABG. There is cardiomegaly. Atherosclerosis noted. Lungs are clear. No pneumothorax or pleural effusion. IMPRESSION: No acute disease. Cardiomegaly. Atherosclerosis. Electronically Signed   By: Inge Rise M.D.   On: 05/06/2019 12:30       Today   Subjective:   Jeff Henderson patient doing much better Objective:   Blood pressure (!)  155/73, pulse 60, temperature 97.6 F (36.4 C), temperature source Oral, resp. rate 17, height 5' 8"  (1.727 m), weight 119.5 kg, SpO2 94 %.  .  Intake/Output Summary (Last 24 hours) at 05/08/2019 1404 Last data filed at 05/07/2019 1900 Gross per 24 hour  Intake 164.35 ml  Output -  Net 164.35 ml    Exam VITAL SIGNS: Blood pressure (!) 155/73, pulse 60, temperature 97.6 F (36.4 C), temperature source Oral, resp. rate 17, height 5' 8"  (1.727 m), weight 119.5 kg, SpO2 94 %.  GENERAL:  78 y.o.-year-old patient lying in the bed with no acute distress.  EYES: Pupils equal, round, reactive to light and accommodation. No scleral icterus. Extraocular muscles intact.  HEENT: Head atraumatic, normocephalic. Oropharynx and nasopharynx clear.  NECK:  Supple, no jugular venous distention. No thyroid enlargement, no tenderness.  LUNGS: Normal breath sounds bilaterally, no wheezing, rales,rhonchi or crepitation. No use of accessory muscles of respiration.  CARDIOVASCULAR: S1, S2 normal. No murmurs, rubs, or gallops.  ABDOMEN: Soft, nontender, nondistended. Bowel sounds present. No organomegaly or mass.  EXTREMITIES: No pedal edema, cyanosis, or clubbing.  NEUROLOGIC: Cranial nerves II through XII are intact. Muscle strength 5/5 in all extremities. Sensation intact. Gait not checked.  PSYCHIATRIC: The patient is alert and oriented x 3.  SKIN: No obvious rash, lesion, or ulcer.   Data Review     CBC w Diff:  Lab Results  Component Value Date   WBC 7.8 05/08/2019   HGB 12.1 (L) 05/08/2019   HGB 13.7 09/16/2015   HCT 37.8 (L) 05/08/2019   HCT 40.9 09/16/2015  PLT 150 05/08/2019   PLT 188 09/16/2015   LYMPHOPCT 16 10/20/2018   MONOPCT 9 10/20/2018   EOSPCT 7 10/20/2018   BASOPCT 1 10/20/2018   CMP:  Lab Results  Component Value Date   NA 141 05/08/2019   NA 143 09/16/2015   K 4.2 05/08/2019   CL 109 05/08/2019   CO2 23 05/08/2019   BUN 32 (H) 05/08/2019   BUN 28 (H) 09/16/2015    CREATININE 1.63 (H) 05/08/2019   PROT 6.9 05/06/2019   ALBUMIN 3.8 05/06/2019   BILITOT 1.0 05/06/2019   ALKPHOS 41 05/06/2019   AST 28 05/06/2019   ALT 19 05/06/2019  .  Micro Results Recent Results (from the past 240 hour(s))  SARS Coronavirus 2 (CEPHEID - Performed in [place] hospital lab), Hosp Order     Status: None   Collection Time: 05/06/19 12:21 PM  Result Value Ref Range Status   SARS Coronavirus 2 NEGATIVE NEGATIVE Final    Comment: (NOTE) If result is NEGATIVE SARS-CoV-2 target nucleic acids are NOT DETECTED. The SARS-CoV-2 RNA is generally detectable in upper and lower  respiratory specimens during the acute phase of infection. The lowest  concentration of SARS-CoV-2 viral copies this assay can detect is 250  copies / mL. A negative result does not preclude SARS-CoV-2 infection  and should not be used as the sole basis for treatment or other  patient management decisions.  A negative result may occur with  improper specimen collection / handling, submission of specimen other  than nasopharyngeal swab, presence of viral mutation(s) within the  areas targeted by this assay, and inadequate number of viral copies  (<250 copies / mL). A negative result must be combined with clinical  observations, patient history, and epidemiological information. If result is POSITIVE SARS-CoV-2 target nucleic acids are DETECTED. The SARS-CoV-2 RNA is generally detectable in upper and lower  respiratory specimens dur ing the acute phase of infection.  Positive  results are indicative of active infection with SARS-CoV-2.  Clinical  correlation with patient history and other diagnostic information is  necessary to determine patient infection status.  Positive results do  not rule out bacterial infection or co-infection with other viruses. If result is PRESUMPTIVE POSTIVE SARS-CoV-2 nucleic acids MAY BE PRESENT.   A presumptive positive result was obtained on the submitted specimen   and confirmed on repeat testing.  While 2019 novel coronavirus  (SARS-CoV-2) nucleic acids may be present in the submitted sample  additional confirmatory testing may be necessary for epidemiological  and / or clinical management purposes  to differentiate between  SARS-CoV-2 and other Sarbecovirus currently known to infect humans.  If clinically indicated additional testing with an alternate test  methodology [phone]) is advised. The SARS-CoV-2 RNA is generally  detectable in upper and lower respiratory sp ecimens during the acute  phase of infection. The expected result is Negative. Fact Sheet for Patients:  StrictlyIdeas.no Fact Sheet for Healthcare Providers: BankingDealers.co.za This test is not yet approved or cleared by the [place] FDA and has been authorized for detection and/or diagnosis of SARS-CoV-2 by FDA under an Emergency Use Authorization (EUA).  This EUA will remain in effect (meaning this test can be used) for the duration of the COVID-19 declaration under Section 564(b)(1) of the Act, 21 U.S.C. section 360bbb-3(b)(1), unless the authorization is terminated or revoked sooner. Performed at [facility] [place], [address] [place], [place] [address]         Code Status Orders  (  From admission, onward)         Start     Ordered   05/06/19 1528  Do not attempt resuscitation (DNR)  Continuous    Question Answer Comment  In the event of cardiac or respiratory ARREST Do not call a "code blue"   In the event of cardiac or respiratory ARREST Do not perform Intubation, CPR, defibrillation or ACLS   In the event of cardiac or respiratory ARREST Use medication by any route, position, wound care, and other measures to relive pain and suffering. May use oxygen, suction and manual treatment of airway obstruction as needed for comfort.      05/06/19 1528        Code Status History    Date Active Date Inactive  Code Status Order ID Comments User Context   06/03/2018 0855 06/03/2018 1928 Full Code [id number]  Isaias Cowman, MD Inpatient   06/01/2018 0007 06/03/2018 0855 Full Code [id number]  Lance Coon, MD Inpatient    Advance Directive Documentation     Most Recent Value  Type of Advance Directive  Living will  Pre-existing out of facility DNR order (yellow form or pink MOST form)  -  "MOST" Form in Place?  -          Follow-up Information    [name], MD On 05/14/2019.   Specialty:  Family Medicine Why:  appontment at 9:20am Contact information: [place] [address] [phone]        Wellington Hampshire, MD On 06/02/2019.   Specialty:  Cardiology Why:  hosp f/u...........virtual appointment at [facility] information: [place] [address] [phone]        [facility] Cardiac and Pulmonary Rehab Follow up.   Specialty:  Cardiac Rehabilitation Why:  Your Cardiologist has referred you to Cardiac Rehab. The Dept. is currently closed due to COVID-19. The Dept. is offering Virtual Rehab. The Dept. will be in contact with you soon. Contact information: [place] [phone] [place] [phone]          Discharge Medications   Allergies as of 05/08/2019      Reactions   Penicillins Rash, Other (See Comments)   Has patient had a PCN reaction causing immediate rash, facial/tongue/throat swelling, SOB or lightheadedness with hypotension: No Has patient had a PCN reaction causing severe rash involving mucus membranes or skin necrosis: No Has patient had a PCN reaction that required hospitalization: No Has patient had a PCN reaction occurring within the last 10 years: No If all of the above answers are "NO", then may proceed with Cephalosporin use.      Medication List    STOP taking these medications   aspirin 81 MG tablet     TAKE these medications   amLODipine-benazepril  10-40 MG capsule Commonly known as:  LOTREL Take 1 capsule by mouth daily.   apixaban 5 MG Tabs tablet Commonly known as:  ELIQUIS Take 1 tablet (5 mg total) by mouth 2 (two) times daily.   atorvastatin 80 MG tablet Commonly known as:  LIPITOR Take 80 mg by mouth daily.   carvedilol 12.5 MG tablet Commonly known as:  COREG Take 1 tablet (12.5 mg total) by mouth 2 (two) times daily with a meal.   clopidogrel 75 MG tablet Commonly known as:  PLAVIX Take 75 mg by mouth daily.   CoQ10 100 MG Caps Take 100 mg by mouth daily.   fluticasone  50 MCG/ACT nasal spray Commonly known as:  FLONASE Place 1 spray into both nostrils 2 (two) times a day.   furosemide 20 MG tablet Commonly known as:  LASIX Take 1 tablet (20 mg total) by mouth daily. Start taking on:  May 09, 2019   isosorbide mononitrate 60 MG 24 hr tablet Commonly known as:  IMDUR Take 60 mg by mouth daily.   Mens 50+ Multi Vitamin/Min Tabs Take 1 tablet by mouth daily.   Omega-3 1000 MG Caps Take 1,000 mg by mouth.          Total Time in preparing paper work, data evaluation and todays exam - 53 minutes  Dustin Flock M.D on 05/08/2019 at 2:04 PM [place]  [phone]

## 2019-05-13 ENCOUNTER — Other Ambulatory Visit: Payer: Self-pay

## 2019-05-13 ENCOUNTER — Telehealth (INDEPENDENT_AMBULATORY_CARE_PROVIDER_SITE_OTHER): Payer: Medicare Other | Admitting: General Practice

## 2019-05-13 ENCOUNTER — Telehealth: Payer: Self-pay

## 2019-05-13 ENCOUNTER — Encounter: Payer: Self-pay | Admitting: General Practice

## 2019-05-13 VITALS — Wt 254.0 lb

## 2019-05-13 DIAGNOSIS — I255 Ischemic cardiomyopathy: Secondary | ICD-10-CM

## 2019-05-13 DIAGNOSIS — I10 Essential (primary) hypertension: Secondary | ICD-10-CM

## 2019-05-13 DIAGNOSIS — I2510 Atherosclerotic heart disease of native coronary artery without angina pectoris: Secondary | ICD-10-CM | POA: Diagnosis not present

## 2019-05-13 DIAGNOSIS — I480 Paroxysmal atrial fibrillation: Secondary | ICD-10-CM

## 2019-05-13 DIAGNOSIS — E785 Hyperlipidemia, unspecified: Secondary | ICD-10-CM | POA: Insufficient documentation

## 2019-05-13 DIAGNOSIS — I214 Non-ST elevation (NSTEMI) myocardial infarction: Secondary | ICD-10-CM

## 2019-05-13 DIAGNOSIS — I5022 Chronic systolic (congestive) heart failure: Secondary | ICD-10-CM

## 2019-05-13 MED ORDER — CARVEDILOL 25 MG PO TABS: 25.0000 mg | ORAL_TABLET | Freq: Two times a day (BID) | ORAL | 3 refills | Status: DC

## 2019-05-13 NOTE — Progress Notes (Signed)
Virtual Visit via Telephone Note   This visit type was conducted due to national recommendations for restrictions regarding the COVID-19 Pandemic (e.g. social distancing) in an effort to limit this patient's exposure and mitigate transmission in our community.  Due to his co-morbid illnesses, this patient is at least at moderate risk for complications without adequate follow up.  This format is felt to be most appropriate for this patient at this time.  The patient did not have access to video technology/had technical difficulties with video requiring transitioning to audio format only (telephone).  All issues noted in this document were discussed and addressed.  No physical exam could be performed with this format.  Please refer to the patient's chart for his  consent to telehealth for [facility]. Evaluation Performed:  Follow-up visit  This visit type was conducted due to national recommendations for restrictions regarding the COVID-19 Pandemic (e.g. social distancing).  This format is felt to be most appropriate for this patient at this time.  All issues noted in this document were discussed and addressed.  No physical exam was performed (except for noted visual exam findings with Video Visits).  Please refer to the patient's chart (MyChart message for video visits and phone note for telephone visits) for the patient's consent to telehealth for [facility]. _____________   Date:  05/13/2019   Patient ID:  Jeff Henderson, DOB 29-Jun-1941, MRN [id number] Patient Location:  [place], [place] [address]  Provider location:   [facility]  Primary Care Provider:  [name], MD Primary Cardiologist:  No primary care provider on file.  Chief Complaint    Follow-up post cath (05/07/2019)  Past Medical History    Past Medical History:  Diagnosis Date  . Arthritis   . CAD (coronary artery disease)    a. 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 06/2018 Cath:  Native 3VD, 2/3 patent grafts (VG->OM2 100)->Med rx; c. 05/2019 NSTEMI/Cath:  LM nl, LAD 99ost, 157m LCX 1029mRCA 100p, LIMA->LAD ok, VG->RPDA ok (? jump to OM), VG->OM not visualized (prev occluded). LVEDP 3048m-->Med Rx.  . CKD (chronic kidney disease), stage III ([place] . Gout   . Hernia, abdominal   . HFrEF (heart failure with reduced ejection fraction) ([place]  a. 05/2019 Echo: EF 30-35%, diff HK.  . HMarland Kitchenperlipidemia LDL goal <70   . Hypertension   . Ischemic cardiomyopathy    a. 05/2019 Echo: EF 30-35%, diff HK. Mod dil LA.  . MMarland Kitchenocardial infarction ([place]CC[place] . PAF (paroxysmal atrial fibrillation) ([place]CC[place]  a. Dx 05/2019-->CHA2DS2VASc = 5-->Eliquis.   Past Surgical History:  Procedure Laterality Date  . carpel tunnel    . LEFT HEART CATH AND CORONARY ANGIOGRAPHY N/A 06/03/2018   Procedure: LEFT HEART CATH AND CORS/GRAFTS ANGIOGRAPHY;  Surgeon: ParIsaias CowmanD;  [facility];  Service: Cardiovascular;  Laterality: N/A;  . LEFT HEART CATH AND CORS/GRAFTS ANGIOGRAPHY Bilateral 05/07/2019   Procedure: LEFT HEART CATH AND CORS/GRAFTS ANGIOGRAPHY;  Surgeon: GolMinna MerrittsD;  [facility];  Service: Cardiovascular;  Laterality: Bilateral;  . REPLACEMENT TOTAL KNEE    . VEIN BYPASS SURGERY      Allergies  Allergies  Allergen Reactions  . Penicillins Rash and Other (See Comments)    Has patient had a PCN reaction causing immediate rash, facial/tongue/throat swelling, SOB or lightheadedness with hypotension: No Has patient had a PCN reaction causing severe rash involving mucus  membranes or skin necrosis: No Has patient had a PCN reaction that required hospitalization: No Has patient had a PCN reaction occurring within the last 10 years: No If all of the above answers are "NO", then may proceed with Cephalosporin use.     History of Present Illness    Jeff Henderson is a 78 y.o. male who presents via audio/video conferencing for a telehealth  visit today.    PMH: Coronary artery disease, CABG 1995, hypertension, non-STEMI (05/07/2019), A. fib, arthritis, degenerative disc disease, CKD stage III, and hyperlipidemia.  Patient was catheterized 06/2018, and on 05/07/2019, patient coronary arteries remain the same, occluded mid LAD, occluded left circumflex, occluded proximal RCA.  Medical management.  Today patient states he feels well except for some congestion and coughing up mucus.  He states that he is using Mucinex to help with this and thin mucus.  When asked about unusual bleeding or bruising he stated that his groin was sore, but the bruising had started to go away.  He does state that he has hemorrhoids and has noticed a slight amount of blood on the tissue paper after he wipes.  He denies hematuria, melena, hemoptysis, palpitations, shortness of breath, chest pain, orthopnea and PND.  He states that he has begun to get back to his normal physical activity and walk around his yard.  He routinely went to the gym to walk prior to the COVID-19 pandemic and states that as soon as the gym opens he would like to return.   The patient does not have symptoms concerning for COVID-19 infection (fever, chills, cough, or new shortness of breath).  He states that when he goes out in public he wears a mask and continues to social distance.  Home Medications    Prior to Admission medications   Medication Sig Start Date End Date Taking? Authorizing Provider  amLODipine-benazepril (LOTREL) 10-40 MG capsule Take 1 capsule by mouth daily.  10/19/14   [provider]  apixaban (ELIQUIS) 5 MG TABS tablet Take 1 tablet (5 mg total) by mouth 2 (two) times daily. 05/08/19   Dustin Flock, MD  atorvastatin (LIPITOR) 80 MG tablet Take 80 mg by mouth daily.     [provider]  carvedilol (COREG) 12.5 MG tablet Take 1 tablet (12.5 mg total) by mouth 2 (two) times daily with a meal. 05/08/19   Dustin Flock, MD  clopidogrel (PLAVIX) 75 MG tablet  Take 75 mg by mouth daily.     [provider]  Coenzyme Q10 (COQ10) 100 MG CAPS Take 100 mg by mouth daily.    [provider]  fluticasone (FLONASE) 50 MCG/ACT nasal spray Place 1 spray into both nostrils 2 (two) times a day.    [provider]  furosemide (LASIX) 20 MG tablet Take 1 tablet (20 mg total) by mouth daily. 05/09/19   Dustin Flock, MD  isosorbide mononitrate (IMDUR) 60 MG 24 hr tablet Take 60 mg by mouth daily.    [provider]  Multiple Vitamins-Minerals (MENS 50+ MULTI VITAMIN/MIN) TABS Take 1 tablet by mouth daily.     [provider]  nitroGLYCERIN (NITROSTAT) 0.4 MG SL tablet Place 1 tablet (0.4 mg total) under the tongue every 5 (five) minutes as needed for chest pain (Nitro if taking third dose, call 911). 05/08/19   Marrianne Mood D, PA-C  Omega-3 1000 MG CAPS Take 1,000 mg by mouth.    [provider]    Review of Systems  All other systems reviewed and are otherwise negative except as noted above.  Physical Exam    Vital Signs:  Wt 254 lb (115.2 kg)   BMI 38.62 kg/m    Alert and oriented x 3, in no acute distress, unlabored breathing, and asking appropriate questions.   Accessory Clinical Findings    ECG personally reviewed by me today - No EKG today.  Assessment & Plan    1.  Coronary artery disease- no chest pain, bruising diminished, and no increased shortness of breath. Continue clopidogrel 75 mg daily Continue isosorbide mononitrate 60 mg tablet daily Continue nitroglycerin 0.4 mg sublingual tablet as needed Continue furosemide 20 mg tablet daily Continue to monitor groin site  2.  Persistent atrial fibrillation- converted to sinus rhythm in ED (05/07/2019) Continue Eliquis 5 mg tablet twice daily   3.  Essential hypertension-well-controlled Continue amlodipine-benazepril 10-40 mg tablet daily Start carvedilol 25 mg tablet twice daily (discharge summary did not reflect 12.5 mg increase   twice daily) Increase physical activity as tolerated  4.  Hyperlipidemia- LDL 156 (11/8/ 2019) Continue atorvastatin 80 mg tablet daily (09/2018 started) Recheck lipids during next office visit.  Patient states he has follow-up visit with PCP 05/14/2019.  Patient was instructed to obtain CBC, BMP during this visit.  COVID-19 Education: The signs and symptoms of COVID-19 were discussed with the patient and how to seek care for testing (follow up with PCP or arrange E-visit).  The importance of social distancing was discussed today.  Patient Risk:   After full review of this patient's history and clinical status, I feel that he is at least moderate risk for cardiac complications at this time, thus necessitating a telehealth visit sooner than our first available in office visit.  Time:   Today, I have spent 20 minutes with the patient with telehealth technology discussing medical history, symptoms, and management plan.    Disposition: Follow-up with Dr. Sherlean Foot in 1 month.  Deberah Pelton, NP 05/13/2019, 2:27 PM

## 2019-05-13 NOTE — Telephone Encounter (Signed)
Patient is scheduled for 6/30, l mom to see if patient can come in today or tomorrow for Ignacia Bayley, NP. Appt needs to be before 6/30. Within the next week

## 2019-05-13 NOTE — Telephone Encounter (Signed)
-----   Message from Arvil Chaco, PA-C sent at 05/08/2019  5:00 PM EDT ----- Regarding: TCM Hello,  This patient was admitted and seen at [facility] with NSTEMI and underwent cardiac catheterization.  We are expecting discharge today (05/08/2019).  Can you please call and arrange / schedule for follow-up TCM appointment with Dr. Fletcher Anon within the next two weeks?  Thank you!  Signed, Arvil Chaco, PA-C 05/08/2019, 5:00 PM Pager [phone]

## 2019-05-13 NOTE — Progress Notes (Signed)
Virtual Visit via Telephone Note   This visit type was conducted due to national recommendations for restrictions regarding the COVID-19 Pandemic (e.g. social distancing) in an effort to limit this patient's exposure and mitigate transmission in our community.  Due to his co-morbid illnesses, this patient is at least at moderate risk for complications without adequate follow up.  This format is felt to be most appropriate for this patient at this time.  The patient did not have access to video technology/had technical difficulties with video requiring transitioning to audio format only (telephone).  All issues noted in this document were discussed and addressed.  No physical exam could be performed with this format.  Please refer to the patient's chart for his  consent to telehealth for [facility]. Evaluation Performed:  Follow-up visit  This visit type was conducted due to national recommendations for restrictions regarding the COVID-19 Pandemic (e.g. social distancing).  This format is felt to be most appropriate for this patient at this time.  All issues noted in this document were discussed and addressed.  No physical exam was performed (except for noted visual exam findings with Video Visits).  Please refer to the patient's chart (MyChart message for video visits and phone note for telephone visits) for the patient's consent to telehealth for [facility]. _____________   Date:  05/13/2019   Patient ID:  Jeff Henderson, DOB Jeff Henderson, MRN [id number] Patient Location:  [address] [place], [place] [address]  Provider location:   [facility]  Primary Care Provider:  [name], MD Primary Cardiologist:  No primary care provider on file.  Chief Complaint    Follow-up  NSTEMI, s/p cardiac catheterization (05/07/2019), HFrEF 30-35%  Past Medical History    Past Medical History:  Diagnosis Date  . Arthritis   . CAD (coronary artery disease)    a. 1995 s/p CABG x 3 (VG->RPDA,  VG->OM2, LIMA->LAD); b. 06/2018 Cath: Native 3VD, 2/3 patent grafts (VG->OM2 100)->Med rx; c. 05/2019 NSTEMI/Cath:  LM nl, LAD 99ost, 157m LCX 1099mRCA 100p, LIMA->LAD ok, VG->RPDA ok (? jump to OM), VG->OM not visualized (prev occluded). LVEDP 3071m-->Med Rx.  . CKD (chronic kidney disease), stage III ([place] . Gout   . Hernia, abdominal   . HFrEF (heart failure with reduced ejection fraction) ([place]  a. 05/2019 Echo: EF 30-35%, diff HK.  . HMarland Kitchenperlipidemia LDL goal <70   . Hypertension   . Ischemic cardiomyopathy    a. 05/2019 Echo: EF 30-35%, diff HK. Mod dil LA.  . MMarland Kitchenocardial infarction ([place] . PAF ([place]oxysmal atrial fibrillation) ([place]  a. Dx 05/2019-->CHA2DS2VASc = 5-->Eliquis.   Past Surgical History:  Procedure Laterality Date  . carpel tunnel    . LEFT HEART CATH AND CORONARY ANGIOGRAPHY N/A 06/03/2018   Procedure: LEFT HEART CATH AND CORS/GRAFTS ANGIOGRAPHY;  Surgeon: ParIsaias CowmanD;  [facility];  Service: Cardiovascular;  Laterality: N/A;  . LEFT HEART CATH AND CORS/GRAFTS ANGIOGRAPHY Bilateral 05/07/2019   Procedure: LEFT HEART CATH AND CORS/GRAFTS ANGIOGRAPHY;  Surgeon: GolMinna MerrittsD;  [facility];  Service: Cardiovascular;  Laterality: Bilateral;  . REPLACEMENT TOTAL KNEE    . VEIN BYPASS SURGERY      Allergies  Allergies  Allergen Reactions  . Penicillins Rash and Other (See Comments)    Has patient had a PCN reaction causing immediate rash, facial/tongue/throat swelling, SOB or lightheadedness with hypotension: No Has patient had a PCN reaction  causing severe rash involving mucus membranes or skin necrosis: No Has patient had a PCN reaction that required hospitalization: No Has patient had a PCN reaction occurring within the last 10 years: No If all of the above answers are "NO", then Jeff proceed with Cephalosporin use.     History of Present Illness    Jeff Henderson is a 78 y.o. male who presents via  audio/video conferencing for a telehealth visit today.     PMH: Coronary artery disease, CABG 1995, hypertension, non-STEMI (05/07/2019), A. fib, arthritis, degenerative disc disease, CKD stage III, and hyperlipidemia.  05/07/2019 ED/cardiac catheterization admission: Patient had been walking for exercise when he experienced chest discomfort and tachycardia.  He presented to the emergency department where he spontaneously converted from atrial fibrillation with RVR (EKG 120 bpm) to normal sinus rhythm.  He displayed positive troponins (0.14 to 53 and down to 36).  05/07/2019, patient coronary arteries remain the same as 06/2018 cath: occluded mid LAD, occluded left circumflex, occluded proximal RCA, patent LIMA to LAD,SVG to Ost RPDA, Dist Cx patent, Graft to 2nd Mrg occluded. Medical management. HFrEF 30-35%  Today patient states he feels well except for some congestion and coughing up mucus.  He states that he is using Mucinex to help with this and thin mucus.  When asked about unusual bleeding or bruising he stated that his groin was sore, but the bruising had started to go away.  He does state that he has hemorrhoids and has noticed a slight amount of blood on the tissue paper after he wipes.  He denies hematuria, melena, hemoptysis, palpitations, shortness of breath, chest pain, orthopnea and PND.  He states that he has begun to get back to his normal physical activity and walk around his yard.  He routinely went to the gym to walk prior to the COVID-19 pandemic and states that as soon as the gym opens he would like to return.     The patient does not have symptoms concerning for COVID-19 infection (fever, chills, cough, or new shortness of breath).  He states that when he goes out in public he wears a mask and continues to social distance.  Home Medications    Prior to Admission medications   Medication Sig Start Date End Date Taking? Authorizing Provider  amLODipine-benazepril (LOTREL) 10-40 MG  capsule Take 1 capsule by mouth daily.  10/19/14  Yes [provider]  apixaban (ELIQUIS) 5 MG TABS tablet Take 1 tablet (5 mg total) by mouth 2 (two) times daily. 05/08/19  Yes Dustin Flock, MD  atorvastatin (LIPITOR) 80 MG tablet Take 80 mg by mouth daily.    Yes [provider]  carvedilol (COREG) 25 MG tablet Take 1 tablet (25 mg total) by mouth 2 (two) times daily with a meal. 05/13/19  Yes Theora Gianotti, NP  clopidogrel (PLAVIX) 75 MG tablet Take 75 mg by mouth daily.    Yes [provider]  Coenzyme Q10 (COQ10) 100 MG CAPS Take 100 mg by mouth daily.   Yes [provider]  fluticasone (FLONASE) 50 MCG/ACT nasal spray Place 1 spray into both nostrils 2 (two) times a day.   Yes [provider]  furosemide (LASIX) 20 MG tablet Take 1 tablet (20 mg total) by mouth daily. 05/09/19  Yes Dustin Flock, MD  isosorbide mononitrate (IMDUR) 60 MG 24 hr tablet Take 60 mg by mouth daily.   Yes [provider]  Multiple Vitamins-Minerals (MENS 50+ MULTI VITAMIN/MIN) TABS Take 1 tablet  by mouth daily.    Yes [provider]  nitroGLYCERIN (NITROSTAT) 0.4 MG SL tablet Place 1 tablet (0.4 mg total) under the tongue every 5 (five) minutes as needed for chest pain (Nitro if taking third dose, call 911). 05/08/19  Yes Visser, Jacquelyn D, PA-C  Omega-3 1000 MG CAPS Take 1,000 mg by mouth.   Yes [provider]    Review of Systems      All other systems reviewed and are otherwise negative except as noted above.  Physical Exam    Vital Signs:  Wt 254 lb (115.2 kg)   BMI 38.62 kg/m    Alert and oriented x 3, in no acute distress, unlabored breathing, and asking appropriate questions.  Accessory Clinical Findings    ECG personally reviewed by me today -No EKG today.  Assessment & Plan    NSTEMI-05/07/2019- stable, no chest pain. occluded mid LAD, occluded left circumflex, occluded proximal RCA, patent LIMA to LAD,SVG to  Ost RPDA, Dist Cx patent, Graft to 2nd Mrg occluded.  HFrEF 30-35% Medical management- clopidogrel 75 mg daily, isosorbide mononitrate 60 mg daily, and nitroglycerin 0.4 mg sublingual.   Coronary artery disease- no chest pain, bruising diminished, and no increased shortness of breath. Continue clopidogrel 75 mg daily Continue isosorbide mononitrate 60 mg tablet daily Continue nitroglycerin 0.4 mg sublingual tablet as needed Continue furosemide 20 mg tablet daily Continue to monitor groin site    Paroxismal atrial fibrillation- converted to sinus rhythm in ED (05/07/2019) Continue Eliquis 5 mg tablet twice daily. Cont beta blocker.     Essential hypertension-well-controlled Continue amlodipine-benazepril 10-40 mg tablet daily Start carvedilol 25 mg tablet twice daily (discharge summary did not reflect 12.5 mg increase  twice daily) Increase physical activity as tolerated    Hyperlipidemia- LDL 156 (11/8/ 2019) Continue atorvastatin 80 mg tablet daily (09/2018 started) Recheck lipids during next office visit.  5. Chronic systolic heart failure HFrEF 30-35%-Cardiac catheterization 05/07/2019  Cont beta blocker and acei. F/u labs w/ pcp Consider spironolactone creat/k stable Continue furosemide 20 mg tablet daily   Patient states he has follow-up visit with PCP 05/14/2019.  Patient was instructed to obtain CBC, BMP during this visit.  COVID-19 Education: The signs and symptoms of COVID-19 were discussed with the patient and how to seek care for testing (follow up with PCP or arrange E-visit).  The importance of social distancing was discussed today.  Patient Risk:   After full review of this patient's history and clinical status, I feel that he is at least moderate risk for cardiac complications at this time, thus necessitating a telehealth visit sooner than our first available in office visit.  Time:   Today, I have spent 20 minutes with the patient with telehealth technology  discussing medical history, symptoms, and management plan.    Disposition: Follow-up with Dr. Rockey Situ in 1 month.    Deberah Pelton, NP 05/13/2019, 5:24 PM

## 2019-05-13 NOTE — Patient Instructions (Signed)
It was a pleasure to speak with you on the phone today! Thank you for allowing [place] to continue taking care of your [facility] needs during this time.   Feel free to call as needed for questions and concerns related to your cardiac needs.   Medication Instructions:  Your physician has recommended you make the following change in your medication:  1- INCREASE Coreg to 1 tablet (25 mg total) twice daily with meals.   If you need a refill on your cardiac medications before your next appointment, please call your pharmacy.   Lab work: Please have lab work taken by your PCP tomorrow. (CBC, BMP)  If you have labs (blood work) drawn today and your tests are completely normal, you will receive your results only by: Marland Kitchen MyChart Message (if you have MyChart) OR . A paper copy in the mail If you have any lab test that is abnormal or we need to change your treatment, we will call you to review the results.  Testing/Procedures: None ordered   Follow-Up: At [facility], you and your health needs are our priority.  As part of our continuing mission to provide you with exceptional heart care, we have created designated Provider Care Teams.  These Care Teams include your primary Cardiologist (physician) and Advanced Practice Providers (APPs -  Physician Assistants and Nurse Practitioners) who all work together to provide you with the care you need, when you need it. You will need a follow up in person appointment in 1 months. Please see Dr. Rockey Situ.

## 2019-05-15 ENCOUNTER — Ambulatory Visit: Payer: Self-pay | Admitting: *Deleted

## 2019-05-15 ENCOUNTER — Encounter: Payer: Medicare Other | Attending: Cardiovascular Disease | Admitting: *Deleted

## 2019-05-15 ENCOUNTER — Other Ambulatory Visit: Payer: Self-pay

## 2019-05-15 ENCOUNTER — Encounter: Payer: Self-pay | Admitting: *Deleted

## 2019-05-15 DIAGNOSIS — I214 Non-ST elevation (NSTEMI) myocardial infarction: Secondary | ICD-10-CM
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
## 2019-05-18 ENCOUNTER — Encounter: Payer: Medicare Other | Admitting: *Deleted

## 2019-05-18 NOTE — Progress Notes (Signed)
Nutrition consult completed

## 2019-05-20 NOTE — Progress Notes (Signed)
Counselor attempted contacting patient per staff request.  Message left encouraging patient to call counselor.  Counselor will follow up again in 1 to 2 weeks if no response from patient.

## 2019-05-25 ENCOUNTER — Other Ambulatory Visit: Payer: Self-pay

## 2019-05-25 ENCOUNTER — Encounter: Payer: Medicare Other | Admitting: *Deleted

## 2019-05-25 DIAGNOSIS — I214 Non-ST elevation (NSTEMI) myocardial infarction: Secondary | ICD-10-CM

## 2019-05-25 NOTE — Progress Notes (Signed)
Cardiac Individual Treatment Plan  Patient Details  Name: Jeff Henderson MRN: [id number] Date of Birth: 08/28/1941 Referring Provider:     Cardiac Rehab from 05/25/2019 in [facility] Cardiac and Pulmonary Rehab  Referring Provider  [name] MD      Initial Encounter Date:    Cardiac Rehab from 05/25/2019 in [facility] Cardiac and Pulmonary Rehab  Date  05/25/19      Visit Diagnosis: NSTEMI (non-ST elevation myocardial infarction) [facility])  Patient's Home Medications on Admission:  Current Outpatient Medications:  .  amLODipine-benazepril (LOTREL) 10-40 MG capsule, Take 1 capsule by mouth daily. , Disp: , Rfl: 0 .  apixaban (ELIQUIS) 5 MG TABS tablet, Take 1 tablet (5 mg total) by mouth 2 (two) times daily., Disp: 60 tablet, Rfl: 2 .  atorvastatin (LIPITOR) 80 MG tablet, Take 80 mg by mouth daily. , Disp: , Rfl:  .  carvedilol (COREG) 25 MG tablet, Take 1 tablet (25 mg total) by mouth 2 (two) times daily with a meal., Disp: 180 tablet, Rfl: 3 .  clopidogrel (PLAVIX) 75 MG tablet, Take 75 mg by mouth daily. , Disp: , Rfl:  .  Coenzyme Q10 (COQ10) 100 MG CAPS, Take 100 mg by mouth daily., Disp: , Rfl:  .  fluticasone (FLONASE) 50 MCG/ACT nasal spray, Place 1 spray into both nostrils 2 (two) times a day., Disp: , Rfl:  .  furosemide (LASIX) 20 MG tablet, Take 1 tablet (20 mg total) by mouth daily., Disp: 30 tablet, Rfl: 0 .  isosorbide mononitrate (IMDUR) 60 MG 24 hr tablet, Take 60 mg by mouth daily., Disp: , Rfl:  .  Multiple Vitamins-Minerals (MENS 50+ MULTI VITAMIN/MIN) TABS, Take 1 tablet by mouth daily. , Disp: , Rfl:  .  nitroGLYCERIN (NITROSTAT) 0.4 MG SL tablet, Place 1 tablet (0.4 mg total) under the tongue every 5 (five) minutes as needed for chest pain (Nitro if taking third dose, call 911)., Disp: 25 tablet, Rfl: 3 .  Omega-3 1000 MG CAPS, Take 1,000 mg by mouth., Disp: , Rfl:   Past Medical History: Past Medical History:  Diagnosis Date  . Arthritis   . CAD (coronary artery  disease)    a. 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 06/2018 Cath: Native 3VD, 2/3 patent grafts (VG->OM2 100)->Med rx; c. 05/2019 NSTEMI/Cath:  LM nl, LAD 99ost, 133m LCX 1066mRCA 100p, LIMA->LAD ok, VG->RPDA ok (? jump to OM), VG->OM not visualized (prev occluded). LVEDP 3034m-->Med Rx.  . CKD (chronic kidney disease), stage III (HCC[place] . Gout   . Hernia, abdominal   . HFrEF (heart failure with reduced ejection fraction) (HCC[place]  a. 05/2019 Echo: EF 30-35%, diff HK.  . HMarland Kitchenperlipidemia LDL goal <70   . Hypertension   . Ischemic cardiomyopathy    a. 05/2019 Echo: EF 30-35%, diff HK. Mod dil LA.  . MMarland Kitchenocardial infarction (HCCJuni[place] . PAF (paroxysmal atrial fibrillation) ([place]  a. Dx 05/2019-->CHA2DS2VASc = 5-->Eliquis.    Tobacco Use: Social History   Tobacco Use  Smoking Status Never Smoker  Smokeless Tobacco Never Used    Labs: Recent Review Flowsheet Data    Labs for ITP Cardiac and Pulmonary Rehab Latest Ref Rng & Units 05/07/2019   Hemoglobin A1c 4.8 - 5.6 % 5.7(H)       Exercise Target Goals: Exercise Program Goal: Individual exercise prescription set using results from initial 6 min walk test and THRR while considering  patient's activity barriers and safety.  Exercise Prescription Goal: Initial exercise prescription builds to 30-45 minutes a day of aerobic activity, 2-3 days per week.  Home exercise guidelines will be given to patient during program as part of exercise prescription that the participant will acknowledge.  Activity Barriers & Risk Stratification: Activity Barriers & Cardiac Risk Stratification - 05/25/19 1452      Activity Barriers & Cardiac Risk Stratification   Activity Barriers  Arthritis;Balance Concerns;Deconditioning;Back Problems;Muscular Weakness;Neck/Spine Problems    Cardiac Risk Stratification  High       6 Minute Walk:   Oxygen Initial Assessment:   Oxygen Re-Evaluation:   Oxygen Discharge (Final Oxygen  Re-Evaluation):   Initial Exercise Prescription: Initial Exercise Prescription - 05/25/19 1400      Date of Initial Exercise RX and Referring Provider   Date  05/25/19    Referring Provider  [name] MD      Track   Minutes  15      Prescription Details   Frequency (times per week)  3-5    Duration  Progress to 30 minutes of continuous aerobic without signs/symptoms of physical distress      Intensity   THRR 40-80% of Max Heartrate  90-124    Ratings of Perceived Exertion  11-13    Perceived Dyspnea  0-4      Progression   Progression  Continue to progress workloads to maintain intensity without signs/symptoms of physical distress.      Resistance Training   Training Prescription  Yes    Weight  ROM/Body Weight    Reps  10-15       Perform Capillary Blood Glucose checks as needed.  Exercise Prescription Changes:   Exercise Comments:   Exercise Goals and Review: Exercise Goals    Row Name 05/25/19 1453             Exercise Goals   Increase Physical Activity  Yes       Intervention  Provide advice, education, support and counseling about physical activity/exercise needs.;Develop an individualized exercise prescription for aerobic and resistive training based on initial evaluation findings, risk stratification, comorbidities and participant's personal goals.       Expected Outcomes  Short Term: Attend rehab on a regular basis to increase amount of physical activity.;Long Term: Add in home exercise to make exercise part of routine and to increase amount of physical activity.;Long Term: Exercising regularly at least 3-5 days a week.       Increase Strength and Stamina  Yes       Intervention  Provide advice, education, support and counseling about physical activity/exercise needs.;Develop an individualized exercise prescription for aerobic and resistive training based on initial evaluation findings, risk stratification, comorbidities and participant's personal  goals.       Expected Outcomes  Short Term: Increase workloads from initial exercise prescription for resistance, speed, and METs.;Short Term: Perform resistance training exercises routinely during rehab and add in resistance training at home;Long Term: Improve cardiorespiratory fitness, muscular endurance and strength as measured by increased METs and functional capacity (6MWT)       Able to understand and use rate of perceived exertion (RPE) scale  Yes       Intervention  Provide education and explanation on how to use RPE scale       Expected Outcomes  Short Term: Able to use RPE daily in rehab to express subjective intensity level;Long Term:  Able to use RPE to guide intensity level when exercising independently  Able to understand and use Dyspnea scale  Yes       Intervention  Provide education and explanation on how to use Dyspnea scale       Expected Outcomes  Short Term: Able to use Dyspnea scale daily in rehab to express subjective sense of shortness of breath during exertion;Long Term: Able to use Dyspnea scale to guide intensity level when exercising independently       Knowledge and understanding of Target Heart Rate Range (THRR)  Yes       Intervention  Provide education and explanation of THRR including how the numbers were predicted and where they are located for reference       Expected Outcomes  Short Term: Able to state/look up THRR;Long Term: Able to use THRR to govern intensity when exercising independently;Short Term: Able to use daily as guideline for intensity in rehab       Able to check pulse independently  Yes       Intervention  Provide education and demonstration on how to check pulse in carotid and radial arteries.;Review the importance of being able to check your own pulse for safety during independent exercise       Expected Outcomes  Short Term: Able to explain why pulse checking is important during independent exercise;Long Term: Able to check pulse independently  and accurately       Understanding of Exercise Prescription  Yes       Intervention  Provide education, explanation, and written materials on patient's individual exercise prescription       Expected Outcomes  Short Term: Able to explain program exercise prescription;Long Term: Able to explain home exercise prescription to exercise independently          Exercise Goals Re-Evaluation :   Discharge Exercise Prescription (Final Exercise Prescription Changes):   Nutrition:  Target Goals: Understanding of nutrition guidelines, daily intake of sodium <1574m, cholesterol <204m calories 30% from fat and 7% or less from saturated fats, daily to have 5 or more servings of fruits and vegetables.  Biometrics:    Nutrition Therapy Plan and Nutrition Goals: Nutrition Therapy & Goals - 05/18/19 1549      Nutrition Therapy   Diet  low sodium HH    Protein (specify units)  90g    Fiber  25 grams    Whole Grain Foods  3 servings    Saturated Fats  12 max. grams    Fruits and Vegetables  5 servings/day    Sodium  1.5 grams      Personal Nutrition Goals   Nutrition Goal  ST: navigate hh eating and digest all the information we discussed LT: lose weight 20lb    Comments  always careful about what eat and drink. Drinks green tea, water, milk. Nutrition bars and shakes. B: special K with bananas or other mixed fruit and honey(1 tsp) and 2% milk. L: small amounts, nutrition bar and ranch salads. D: fish sometimes but will have chefs salads. discussed Myplate, HH eating, some swaps he could incorporate (yogurt and ranch seasoning for traditional ranch) and label reading. Talked about realistic weight loss and expectations. Pt reports that he has fluid built up that he would like to lose, talked about sodium.      Intervention Plan   Intervention  Prescribe, educate and counsel regarding individualized specific dietary modifications aiming towards targeted core components such as weight, hypertension,  lipid management, diabetes, heart failure and other comorbidities.    Expected Outcomes  Short  Term Goal: A plan has been developed with personal nutrition goals set during dietitian appointment.;Short Term Goal: Understand basic principles of dietary content, such as calories, fat, sodium, cholesterol and nutrients.;Long Term Goal: Adherence to prescribed nutrition plan.       Nutrition Assessments:   Nutrition Goals Re-Evaluation:   Nutrition Goals Discharge (Final Nutrition Goals Re-Evaluation):   Psychosocial: Target Goals: Acknowledge presence or absence of significant depression and/or stress, maximize coping skills, provide positive support system. Participant is able to verbalize types and ability to use techniques and skills needed for reducing stress and depression.   Initial Review & Psychosocial Screening:   Quality of Life Scores:   Scores of 19 and below usually indicate a poorer quality of life in these areas.  A difference of  2-3 points is a clinically meaningful difference.  A difference of 2-3 points in the total score of the Quality of Life Index has been associated with significant improvement in overall quality of life, self-image, physical symptoms, and general health in studies assessing change in quality of life.  PHQ-9: Recent Review Flowsheet Data    There is no flowsheet data to display.     Interpretation of Total Score  Total Score Depression Severity:  1-4 = Minimal depression, 5-9 = Mild depression, 10-14 = Moderate depression, 15-19 = Moderately severe depression, 20-27 = Severe depression   Psychosocial Evaluation and Intervention:   Psychosocial Re-Evaluation:   Psychosocial Discharge (Final Psychosocial Re-Evaluation):   Vocational Rehabilitation: Provide vocational rehab assistance to qualifying candidates.   Vocational Rehab Evaluation & Intervention:   Education: Education Goals: Education classes will be provided on a variety of  topics geared toward better understanding of heart health and risk factor modification. Participant will state understanding/return demonstration of topics presented as noted by education test scores.  Learning Barriers/Preferences:   Education Topics:  AED/CPR: - Group verbal and written instruction with the use of models to demonstrate the basic use of the AED with the basic ABC's of resuscitation.   General Nutrition Guidelines/Fats and Fiber: -Group instruction provided by verbal, written material, models and posters to present the general guidelines for heart healthy nutrition. Gives an explanation and review of dietary fats and fiber.   Controlling Sodium/Reading Food Labels: -Group verbal and written material supporting the discussion of sodium use in heart healthy nutrition. Review and explanation with models, verbal and written materials for utilization of the food label.   Exercise Physiology & General Exercise Guidelines: - Group verbal and written instruction with models to review the exercise physiology of the cardiovascular system and associated critical values. Provides general exercise guidelines with specific guidelines to those with heart or lung disease.    Aerobic Exercise & Resistance Training: - Gives group verbal and written instruction on the various components of exercise. Focuses on aerobic and resistive training programs and the benefits of this training and how to safely progress through these programs..   Flexibility, Balance, Mind/Body Relaxation: Provides group verbal/written instruction on the benefits of flexibility and balance training, including mind/body exercise modes such as yoga, pilates and tai chi.  Demonstration and skill practice provided.   Stress and Anxiety: - Provides group verbal and written instruction about the health risks of elevated stress and causes of high stress.  Discuss the correlation between heart/lung disease and anxiety and  treatment options. Review healthy ways to manage with stress and anxiety.   Depression: - Provides group verbal and written instruction on the correlation between heart/lung disease and depressed  mood, treatment options, and the stigmas associated with seeking treatment.   Anatomy & Physiology of the Heart: - Group verbal and written instruction and models provide basic cardiac anatomy and physiology, with the coronary electrical and arterial systems. Review of Valvular disease and Heart Failure   Cardiac Procedures: - Group verbal and written instruction to review commonly prescribed medications for heart disease. Reviews the medication, class of the drug, and side effects. Includes the steps to properly store meds and maintain the prescription regimen. (beta blockers and nitrates)   Cardiac Medications I: - Group verbal and written instruction to review commonly prescribed medications for heart disease. Reviews the medication, class of the drug, and side effects. Includes the steps to properly store meds and maintain the prescription regimen.   Cardiac Medications II: -Group verbal and written instruction to review commonly prescribed medications for heart disease. Reviews the medication, class of the drug, and side effects. (all other drug classes)    Go Sex-Intimacy & Heart Disease, Get SMART - Goal Setting: - Group verbal and written instruction through game format to discuss heart disease and the return to sexual intimacy. Provides group verbal and written material to discuss and apply goal setting through the application of the S.M.A.R.T. Method.   Other Matters of the Heart: - Provides group verbal, written materials and models to describe Stable Angina and Peripheral Artery. Includes description of the disease process and treatment options available to the cardiac patient.   Exercise & Equipment Safety: - Individual verbal instruction and demonstration of equipment use and  safety with use of the equipment.   Infection Prevention: - Provides verbal and written material to individual with discussion of infection control including proper hand washing and proper equipment cleaning during exercise session.   Falls Prevention: - Provides verbal and written material to individual with discussion of falls prevention and safety.   Diabetes: - Individual verbal and written instruction to review signs/symptoms of diabetes, desired ranges of glucose level fasting, after meals and with exercise. Acknowledge that pre and post exercise glucose checks will be done for 3 sessions at entry of program.   Know Your Numbers and Risk Factors: -Group verbal and written instruction about important numbers in your health.  Discussion of what are risk factors and how they play a role in the disease process.  Review of Cholesterol, Blood Pressure, Diabetes, and BMI and the role they play in your overall health.   Sleep Hygiene: -Provides group verbal and written instruction about how sleep can affect your health.  Define sleep hygiene, discuss sleep cycles and impact of sleep habits. Review good sleep hygiene tips.    Other: -Provides group and verbal instruction on various topics (see comments)   Knowledge Questionnaire Score:   Core Components/Risk Factors/Patient Goals at Admission: Personal Goals and Risk Factors at Admission - 05/25/19 1454      Core Components/Risk Factors/Patient Goals on Admission    Weight Management  Yes;Weight Loss    Intervention  Weight Management: Develop a combined nutrition and exercise program designed to reach desired caloric intake, while maintaining appropriate intake of nutrient and fiber, sodium and fats, and appropriate energy expenditure required for the weight goal.;Weight Management: Provide education and appropriate resources to help participant work on and attain dietary goals.    Expected Outcomes  Short Term: Continue to assess  and modify interventions until short term weight is achieved;Long Term: Adherence to nutrition and physical activity/exercise program aimed toward attainment of established weight goal;Weight Loss: Understanding  of general recommendations for a balanced deficit meal plan, which promotes 1-2 lb weight loss per week and includes a negative energy balance of [phone] kcal/d;Understanding recommendations for meals to include 15-35% energy as protein, 25-35% energy from fat, 35-60% energy from carbohydrates, less than 271m of dietary cholesterol, 20-35 gm of total fiber daily;Understanding of distribution of calorie intake throughout the day with the consumption of 4-5 meals/snacks    Hypertension  Yes    Intervention  Monitor prescription use compliance.;Provide education on lifestyle modifcations including regular physical activity/exercise, weight management, moderate sodium restriction and increased consumption of fresh fruit, vegetables, and low fat dairy, alcohol moderation, and smoking cessation.    Expected Outcomes  Short Term: Continued assessment and intervention until BP is < 140/943mHG in hypertensive participants. < 130/8086mG in hypertensive participants with diabetes, heart failure or chronic kidney disease.;Long Term: Maintenance of blood pressure at goal levels.       Core Components/Risk Factors/Patient Goals Review:    Core Components/Risk Factors/Patient Goals at Discharge (Final Review):    ITP Comments: ITP Comments    Row Name 05/15/19 1103 05/25/19 1414 05/25/19 1415       ITP Comments  Initial visit with [facility] completed. Consent and intake done. Appts for EP and RD created. Note to Mental Health counselor sent  regarding Depression symptoms. Jeff Henderson did see his primary physican this week and talked about is depression at that appoinment, he does not want pills.  Completed initial ExRx created and sent to Dr. MarEmily Filbertedical Director to review and  sign.  Jeff Henderson noted that his feet were swollen today.  They have been bothering him for a few days.  He was encouraged to contact his doctor about possibly taking an extra dose of Lasix to help reduce his fluid.        Comments: Initial Virtual ExRx

## 2019-05-25 NOTE — Progress Notes (Signed)
Starting out your exercise session should last for 10 to 15 minutes for 3-5 days a week.    Your progression for home exercise is:  Start increasing your walking time to 15 min each time by adding a minute each day.  Once you have gotten to 15 min 3 days a week to add a minute again until you reach 30 min 3 days a week.  Continue with that for 1-2 weeks then begin to add in another day to build to 5 days a week. Please go back to [employer] once it reopens.  Resistance: Using handout (mailed) or videos, please aim for 6-8 exercises of 12 reps each.  You can increase resistance by adding in another set or adding in soup cans or bottles of water. Can use machines at gym once open  Exercise at a comfortable pace, using your heart rate and rate of perceived exertion as guides for intensity.  Your target heart rate range is 90-124.

## 2019-05-28 ENCOUNTER — Other Ambulatory Visit: Payer: Self-pay

## 2019-05-28 DIAGNOSIS — N183 Chronic kidney disease, stage 3 unspecified: Secondary | ICD-10-CM

## 2019-05-28 NOTE — Progress Notes (Signed)
Attempted follow up per staff request.  Message left encouraging patient to call back.  Counselor will try once more in July.

## 2019-06-01 NOTE — Progress Notes (Signed)
[facility]  [address] [place], [place] [address] Phone: [phone]  Fax: [phone]   Clinic Day:  06/02/2019  Referring physician: [name], MD  Chief Complaint: Jeff Henderson is a 78 y.o. 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  HPI:   The patient was initially evaluated by [facility] for elevated kappa light chain. Elevated kappa light chain was felt possibly due to chronic kidney disease rather than the cause.He was last seen by Dr. 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 Skeletal survey and pelvic MRI did not reveal any lytic lesions. He was seen by Dr. Dewaine Oats from nephrology because of chronic kidney disease thought due to NSAIDS.  The patient was initially seen by Dr. Janese Banks on 10/289/2018.  There was no evidence of monclonal protein in serum and urine. Over the years, he had a grdaually increasing level of kappa light chains as well as lambda light chains with a preserved kappa lambda light chain ratio. Findings were felt secondary to CKD and not because of hematologic disorder like MGUS/ multiple myeloma or amyloidosis.  The patient was last seen by Dr.Rao on 10/20/2018. At that time, he was doing well without complaint. Hemoglobin was stable between 12-13.  He had no hypercalcemia.  Creatinine has ranged between 1.50 - 1.85 between 09/16/2015 - 05/08/2019.  M-spike has been followed: 0 on 09/30/2017, 04/08/2018, 07/16/2018, and 10/20/2018.  Kappa free light chains have been followed: 41.7 (ratio 1.45) on 09/30/2017, 46.6 (ratio 2.09) on 04/08/2018, 36.1 (ratio 1.85) on  07/16/2018, and 37.6 (ratio 1.75) on 10/10/2018.  He was admitted to [facility] from 05/06/2019 - 05/08/2019 with atrial fibrillation and RVR.  He underwent cardiac cath which showed chronically stable coronary artery disease.  Echo on 05/07/2019 revealed an ejection fraction of 30-35%. He was started on Eliquis.  Symptomatically, the patient states "I'm in pain." He reports back pain and in his LLQ, and edema in his feet x 1 week. He reports blood in his stool about 2-3 weeks ago which he attributes to his hemorrhoids. He denies any fever, sweats, hematuria, and diarrhea. He denies any shortness of breath, cough, and abdominal pain. He was on aspirin and is now on Eliquis.   He works out around the house to stay active, and his diet is being well maintained. He does not eat a lot of red meat, but consumes a lot of vegetables weekly. He only craves ice when he drinks teas. He denies any herbal product use.   Dr. Fletcher Anon is his cardiologist.    Past Medical History:  Diagnosis Date  . Arthritis   . CAD (coronary artery disease)    a. 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 06/2018 Cath: Native 3VD, 2/3 patent grafts (VG->OM2 100)->Med rx; c. 05/2019 NSTEMI/Cath:  LM nl, LAD 99ost, 144m LCX 1023mRCA 100p, LIMA->LAD ok, VG->RPDA ok (? jump to OM), VG->OM not visualized (prev occluded). LVEDP 3054m-->Med Rx.  . CKD (chronic kidney disease), stage III (HCCBarre . Gout   . Hernia, abdominal   . HFrEF (heart failure with reduced ejection fraction) (HCCBantam  a. 05/2019 Echo: EF 30-35%, diff HK.  . HMarland Kitchenperlipidemia  LDL goal <70   . Hypertension   . Ischemic cardiomyopathy    a. 05/2019 Echo: EF 30-35%, diff HK. Mod dil LA.  Marland Kitchen Myocardial infarction ([place])   . PAF (paroxysmal atrial fibrillation) ([place])    a. Dx 05/2019-->CHA2DS2VASc = 5-->Eliquis.    Past Surgical History:  Procedure Laterality Date  . carpel tunnel    . LEFT HEART CATH AND CORONARY ANGIOGRAPHY N/A 06/03/2018   Procedure: LEFT HEART  CATH AND CORS/GRAFTS ANGIOGRAPHY;  Surgeon: Isaias Cowman, MD;  Location: [facility];  Service: Cardiovascular;  Laterality: N/A;  . LEFT HEART CATH AND CORS/GRAFTS ANGIOGRAPHY Bilateral 05/07/2019   Procedure: LEFT HEART CATH AND CORS/GRAFTS ANGIOGRAPHY;  Surgeon: Minna Merritts, MD;  Location: [facility];  Service: Cardiovascular;  Laterality: Bilateral;  . REPLACEMENT TOTAL KNEE    . VEIN BYPASS SURGERY      Family History  Problem Relation Age of Onset  . Asthma Mother   . Diabetes Father   . Hypertension Father     Social History:  reports that he has never smoked. He has never used smokeless tobacco. He reports that he does not drink alcohol or use drugs. The patient has been in the [employer]. He retired from the police and Research officer, trade union. He denies any exposure to radiation and toxins The patient is alone today.  Allergies:  Allergies  Allergen Reactions  . Penicillins Rash and Other (See Comments)    Has patient had a PCN reaction causing immediate rash, facial/tongue/throat swelling, SOB or lightheadedness with hypotension: No Has patient had a PCN reaction causing severe rash involving mucus membranes or skin necrosis: No Has patient had a PCN reaction that required hospitalization: No Has patient had a PCN reaction occurring within the last 10 years: No If all of the above answers are "NO", then may proceed with Cephalosporin use.     Current Medications: Current Outpatient Medications  Medication Sig Dispense Refill  . amLODipine-benazepril (LOTREL) 10-40 MG capsule Take 1 capsule by mouth daily.   0  . apixaban (ELIQUIS) 5 MG TABS tablet Take 1 tablet (5 mg total) by mouth 2 (two) times daily. 60 tablet 2  . atorvastatin (LIPITOR) 80 MG tablet Take 80 mg by mouth daily.     . carvedilol (COREG) 25 MG tablet Take 1 tablet (25 mg total) by mouth 2 (two) times daily with a meal. 180 tablet 3  . clopidogrel (PLAVIX) 75 MG tablet Take 75 mg by  mouth daily.     . Coenzyme Q10 (COQ10) 100 MG CAPS Take 100 mg by mouth daily.    . fluticasone (FLONASE) 50 MCG/ACT nasal spray Place 1 spray into both nostrils 2 (two) times a day.    . furosemide (LASIX) 20 MG tablet Take 1 tablet (20 mg total) by mouth daily. 30 tablet 0  . isosorbide mononitrate (IMDUR) 60 MG 24 hr tablet Take 60 mg by mouth daily.    . Multiple Vitamins-Minerals (MENS 50+ MULTI VITAMIN/MIN) TABS Take 1 tablet by mouth daily.     . Omega-3 1000 MG CAPS Take 1,000 mg by mouth.    . nitroGLYCERIN (NITROSTAT) 0.4 MG SL tablet Place 1 tablet (0.4 mg total) under the tongue every 5 (five) minutes as needed for chest pain (Nitro if taking third dose, call 911). (Patient not taking: Reported on 06/02/2019) 25 tablet 3   No current facility-administered medications for this visit.     Review of Systems  Constitutional: Negative for chills,  fever, malaise/fatigue and weight loss (up 6 lbs.).       "I'm in pain."  HENT: Negative for congestion, hearing loss and sore throat.   Eyes: Negative for blurred vision.  Respiratory: Negative for cough, hemoptysis and shortness of breath.   Cardiovascular: Negative for chest pain, palpitations and leg swelling.  Gastrointestinal: Positive for blood in stool. Negative for abdominal pain, diarrhea, heartburn, nausea and vomiting.  Genitourinary: Negative for dysuria, frequency and hematuria.  Musculoskeletal: Positive for back pain (LLQ, posterior iliac crest). Negative for joint pain and myalgias.  Skin: Negative for rash.  Neurological: Negative for dizziness, sensory change, weakness and headaches.  Endo/Heme/Allergies: Does not bruise/bleed easily.  Psychiatric/Behavioral: Negative for depression and memory loss. The patient is not nervous/anxious and does not have insomnia.   All other systems reviewed and are negative.  Performance status (ECOG): 1-2  Blood pressure (!) 121/57, pulse (!) 51, temperature 97.9 F (36.6 C),  temperature source Tympanic, resp. rate 20, height 5' 8" (1.727 m), weight 267 lb 15.5 oz (121.6 kg), SpO2 97 %.   Physical Exam  Constitutional: He is oriented to person, place, and time. He appears well-developed and well-nourished. No distress.  HENT:  Head: Normocephalic and atraumatic.  Mouth/Throat: No oropharyngeal exudate.  Short gray hair. Mask.  Eyes: Pupils are equal, round, and reactive to light. Conjunctivae and EOM are normal. No scleral icterus.  Blue eyes.  Neck: Normal range of motion. Neck supple.  Cardiovascular: Normal rate, regular rhythm and normal heart sounds.  No murmur heard. Pulmonary/Chest: Effort normal and breath sounds normal. No respiratory distress.  Decreased breath sounds at the bases.  Abdominal: Soft. Bowel sounds are normal. He exhibits no distension (full) and no mass. There is no abdominal tenderness. There is no rebound and no guarding. A hernia (umbilical) is present.  Musculoskeletal: Normal range of motion.        General: Edema (left lower extremity 2-3+; right lower extremity 1-2+) present.  Lymphadenopathy:    He has no cervical adenopathy.  Neurological: He is alert and oriented to person, place, and time. He has normal reflexes.  Skin: Skin is warm and dry. No rash noted. He is not diaphoretic. No erythema. No pallor.  Psychiatric: He has a normal mood and affect. His behavior is normal. Judgment and thought content normal.  Nursing note and vitals reviewed.    Appointment on 06/02/2019  Component Date Value Ref Range Status  . Sodium 06/02/2019 138  135 - 145 mmol/L Final  . Potassium 06/02/2019 4.1  3.5 - 5.1 mmol/L Final  . Chloride 06/02/2019 105  98 - 111 mmol/L Final  . CO2 06/02/2019 25  22 - 32 mmol/L Final  . Glucose, Bld 06/02/2019 127* 70 - 99 mg/dL Final  . BUN 06/02/2019 41* 8 - 23 mg/dL Final  . Creatinine, Ser 06/02/2019 1.76* 0.61 - 1.24 mg/dL Final  . Calcium 06/02/2019 8.8* 8.9 - 10.3 mg/dL Final  . Total Protein  06/02/2019 6.6  6.5 - 8.1 g/dL Final  . Albumin 06/02/2019 3.8  3.5 - 5.0 g/dL Final  . AST 06/02/2019 24  15 - 41 U/L Final  . ALT 06/02/2019 21  0 - 44 U/L Final  . Alkaline Phosphatase 06/02/2019 40  38 - 126 U/L Final  . Total Bilirubin 06/02/2019 0.9  0.3 - 1.2 mg/dL Final  . GFR calc non Af Amer 06/02/2019 36* >60 mL/min Final  . GFR calc Af Amer 06/02/2019 42* >60 mL/min Final  . Anion  gap 06/02/2019 8  5 - 15 Final   Performed at [facility], [address] [place], [place] [address]  . WBC 06/02/2019 7.0  4.0 - 10.5 K/uL Final  . RBC 06/02/2019 3.52* 4.22 - 5.81 MIL/uL Final  . Hemoglobin 06/02/2019 11.2* 13.0 - 17.0 g/dL Final  . HCT 06/02/2019 34.0* 39.0 - 52.0 % Final  . MCV 06/02/2019 96.6  80.0 - 100.0 fL Final  . MCH 06/02/2019 31.8  26.0 - 34.0 pg Final  . MCHC 06/02/2019 32.9  30.0 - 36.0 g/dL Final  . RDW 06/02/2019 13.0  11.5 - 15.5 % Final  . Platelets 06/02/2019 132* 150 - 400 K/uL Final  . nRBC 06/02/2019 0.0  0.0 - 0.2 % Final  . Neutrophils Relative % 06/02/2019 72  % Final  . Neutro Abs 06/02/2019 5.0  1.7 - 7.7 K/uL Final  . Lymphocytes Relative 06/02/2019 14  % Final  . Lymphs Abs 06/02/2019 1.0  0.7 - 4.0 K/uL Final  . Monocytes Relative 06/02/2019 9  % Final  . Monocytes Absolute 06/02/2019 0.6  0.1 - 1.0 K/uL Final  . Eosinophils Relative 06/02/2019 4  % Final  . Eosinophils Absolute 06/02/2019 0.3  0.0 - 0.5 K/uL Final  . Basophils Relative 06/02/2019 1  % Final  . Basophils Absolute 06/02/2019 0.0  0.0 - 0.1 K/uL Final  . Immature Granulocytes 06/02/2019 0  % Final  . Abs Immature Granulocytes 06/02/2019 0.03  0.00 - 0.07 K/uL Final   Performed at [facility], [address] [place], [place] [address]    Assessment:  Jeff Henderson is a 78 y.o. male with elevated free light chains secondary to chronic kidney disease (CKD).  Bone marrow biopsy on 05/01/2013 revealed a normocellular bone 40% with trilineage hematopoiesis  and 1-2% polyclonal plasma cells.  Fat pad biopsy on 05/19/2013 did not reveal any evidence of malignancy or amyloidosis.    Skeletal survey on 05/04/2013 revealed lucencies of the calvarium, cervical spine, pelvis and proximal humeri which may represent metastases/myeloma.  Pelvic MRI on 05/20/2013 revealed no myelomatous lesions.  Lucencies were felt non-specific and likely artifact.  Hemoglobin has been stable between 12-13.  He has no hypercalcemia.  Creatinine has ranged between 1.50 - 1.85 between 09/16/2015 - 05/08/2019.  M-spike has been followed: 0 on 09/30/2017, 04/08/2018, 07/16/2018, and 10/20/2018.  Kappa free light chains have been followed: 41.7 (ratio 1.45) on 09/30/2017, 46.6 (ratio 2.09) on 04/08/2018, 36.1 (ratio 1.85) on 07/16/2018, and 37.6 (ratio 1.75) on 10/10/2018.  He was admitted to [facility] from 05/06/2019 - 05/08/2019 with atrial fibrillation and RVR.  He underwent cardiac cath which showed chronically stable coronary artery disease.  Echo on 05/07/2019 revealed an ejection fraction of 30-35%. He was started on Eliquis.  Symptomatically, he has had left-sided back pain for 2 weeks.  He has lower extremity swelling.  Plan: 1.   Labs today:  CBC with diff, CMP, myeloma panel, FLCA. 2.   Elevated free light chains  Review entire medical history, diagnosis and management of free light chains.  Etiology felt secondary to chronic kidney disease.    No current evidence of an underlying plasmacytic disorder. 3.   Anemia of chronic kidney disease   Creatinine 1.76.  Additional labs today: B12, folate, TSH, ferritin, iron studies, retic count. 4.   RTC in 6 months for MD assessment, labs (CBC with diff, CMP, SPEP, FLCA).  I discussed the assessment and treatment plan with the patient.  The  patient was provided an opportunity to ask questions and all were answered.  The patient agreed with the plan and demonstrated an understanding of the instructions.  The patient was advised  to call back if the symptoms worsen or if the condition fails to improve as anticipated.  I provided 25 minutes of face-to-face time during this this encounter and > 50% was spent counseling as documented under my assessment and plan.    Lequita Asal, MD, PhD    06/02/2019, 10:15 AM  I, Selena Batten, am acting as scribe for [employer]. Mike Gip, MD, PhD.  I, Melissa C. Mike Gip, MD, have reviewed the above documentation for accuracy and completeness, and I agree with the above.

## 2019-06-02 ENCOUNTER — Telehealth: Payer: Medicare Other | Admitting: Nurse Practitioner

## 2019-06-02 ENCOUNTER — Inpatient Hospital Stay (HOSPITAL_BASED_OUTPATIENT_CLINIC_OR_DEPARTMENT_OTHER): Payer: Medicare Other | Admitting: Hematology and Oncology

## 2019-06-02 ENCOUNTER — Inpatient Hospital Stay: Payer: Medicare Other | Attending: Hematology and Oncology

## 2019-06-02 ENCOUNTER — Other Ambulatory Visit: Payer: Self-pay

## 2019-06-02 ENCOUNTER — Telehealth: Payer: Self-pay | Admitting: Cardiovascular Disease

## 2019-06-02 ENCOUNTER — Encounter: Payer: Self-pay | Admitting: Hematology and Oncology

## 2019-06-02 VITALS — BP 121/57 | HR 51 | Temp 97.9°F | Resp 20 | Ht 68.0 in | Wt 268.0 lb

## 2019-06-02 DIAGNOSIS — I2510 Atherosclerotic heart disease of native coronary artery without angina pectoris: Secondary | ICD-10-CM | POA: Insufficient documentation

## 2019-06-02 DIAGNOSIS — D631 Anemia in chronic kidney disease: Secondary | ICD-10-CM | POA: Insufficient documentation

## 2019-06-02 DIAGNOSIS — N183 Chronic kidney disease, stage 3 unspecified: Secondary | ICD-10-CM

## 2019-06-02 DIAGNOSIS — Z79899 Other long term (current) drug therapy: Secondary | ICD-10-CM | POA: Insufficient documentation

## 2019-06-02 DIAGNOSIS — D641 Secondary sideroblastic anemia due to disease: Secondary | ICD-10-CM

## 2019-06-02 DIAGNOSIS — E785 Hyperlipidemia, unspecified: Secondary | ICD-10-CM | POA: Insufficient documentation

## 2019-06-02 DIAGNOSIS — D649 Anemia, unspecified: Secondary | ICD-10-CM

## 2019-06-02 DIAGNOSIS — I129 Hypertensive chronic kidney disease with stage 1 through stage 4 chronic kidney disease, or unspecified chronic kidney disease: Secondary | ICD-10-CM | POA: Diagnosis not present

## 2019-06-02 DIAGNOSIS — R768 Other specified abnormal immunological findings in serum: Secondary | ICD-10-CM

## 2019-06-02 DIAGNOSIS — I480 Paroxysmal atrial fibrillation: Secondary | ICD-10-CM | POA: Diagnosis not present

## 2019-06-02 DIAGNOSIS — I252 Old myocardial infarction: Secondary | ICD-10-CM | POA: Diagnosis not present

## 2019-06-02 DIAGNOSIS — I255 Ischemic cardiomyopathy: Secondary | ICD-10-CM | POA: Diagnosis not present

## 2019-06-02 DIAGNOSIS — Z7901 Long term (current) use of anticoagulants: Secondary | ICD-10-CM | POA: Insufficient documentation

## 2019-06-02 LAB — VITAMIN B12: Vitamin B-12: 589 pg/mL (ref 180–914)

## 2019-06-02 LAB — COMPREHENSIVE METABOLIC PANEL
ALT: 21 U/L (ref 0–44)
AST: 24 U/L (ref 15–41)
Albumin: 3.8 g/dL (ref 3.5–5.0)
Alkaline Phosphatase: 40 U/L (ref 38–126)
Anion gap: 8 (ref 5–15)
BUN: 41 mg/dL — ABNORMAL HIGH (ref 8–23)
CO2: 25 mmol/L (ref 22–32)
Calcium: 8.8 mg/dL — ABNORMAL LOW (ref 8.9–10.3)
Chloride: 105 mmol/L (ref 98–111)
Creatinine, Ser: 1.76 mg/dL — ABNORMAL HIGH (ref 0.61–1.24)
GFR calc Af Amer: 42 mL/min — ABNORMAL LOW (ref >60)
GFR calc non Af Amer: 36 mL/min — ABNORMAL LOW (ref >60)
Glucose, Bld: 127 mg/dL — ABNORMAL HIGH (ref 70–99)
Potassium: 4.1 mmol/L (ref 3.5–5.1)
Sodium: 138 mmol/L (ref 135–145)
Total Bilirubin: 0.9 mg/dL (ref 0.3–1.2)
Total Protein: 6.6 g/dL (ref 6.5–8.1)

## 2019-06-02 LAB — CBC WITH DIFFERENTIAL/PLATELET
Abs Immature Granulocytes: 0.03 10*3/uL (ref 0.00–0.07)
Basophils Absolute: 0 10*3/uL (ref 0.0–0.1)
Basophils Relative: 1 %
Eosinophils Absolute: 0.3 10*3/uL (ref 0.0–0.5)
Eosinophils Relative: 4 %
HCT: 34 % — ABNORMAL LOW (ref 39.0–52.0)
Hemoglobin: 11.2 g/dL — ABNORMAL LOW (ref 13.0–17.0)
Immature Granulocytes: 0 %
Lymphocytes Relative: 14 %
Lymphs Abs: 1 10*3/uL (ref 0.7–4.0)
MCH: 31.8 pg (ref 26.0–34.0)
MCHC: 32.9 g/dL (ref 30.0–36.0)
MCV: 96.6 fL (ref 80.0–100.0)
Monocytes Absolute: 0.6 10*3/uL (ref 0.1–1.0)
Monocytes Relative: 9 %
Neutro Abs: 5 10*3/uL (ref 1.7–7.7)
Neutrophils Relative %: 72 %
Platelets: 132 10*3/uL — ABNORMAL LOW (ref 150–400)
RBC: 3.52 MIL/uL — ABNORMAL LOW (ref 4.22–5.81)
RDW: 13 % (ref 11.5–15.5)
WBC: 7 10*3/uL (ref 4.0–10.5)
nRBC: 0 % (ref 0.0–0.2)

## 2019-06-02 LAB — IRON AND TIBC
Iron: 55 ug/dL (ref 45–182)
Saturation Ratios: 19 % (ref 17.9–39.5)
TIBC: 290 ug/dL (ref 250–450)
UIBC: 235 ug/dL

## 2019-06-02 LAB — TSH: TSH: 2.691 u[IU]/mL (ref 0.350–4.500)

## 2019-06-02 LAB — FERRITIN: Ferritin: 131 ng/mL (ref 24–336)

## 2019-06-02 LAB — RETICULOCYTES
Immature Retic Fract: 6.8 % (ref 2.3–15.9)
RBC.: 3.58 MIL/uL — ABNORMAL LOW (ref 4.22–5.81)
Retic Count, Absolute: 49.8 10*3/uL (ref 19.0–186.0)
Retic Ct Pct: 1.4 % (ref 0.4–3.1)

## 2019-06-02 LAB — FOLATE: Folate: 19.6 ng/mL (ref >5.9)

## 2019-06-02 NOTE — Telephone Encounter (Signed)
Spoke with the pt and his "daughters mother" Ivin Booty.  Pt has been having increased LE edema. Pt has been taking increased Lasix 18m daily for 1 week as instructed by his pcp with the improvement. Pt denies weight gain, sob, PND, orthopnea.  SIvin Bootyhas many questions related to the pt care and his recent cardiac cath  - The pt has blockages, why was there no stents placed ? - Pt has a hx of bypass is he a candidate for repeat bypass? -Why is he on 2 blood thinners?  Questions answered to the best of my ability.- lengthy phone call lasting > 25 min SIvin Bootysts that they called the office this afternoon because the pt was to have a telemedicine visit with CLucillie Garfinkel@ 2pm but they have not heard anything. Adv the pt that the virtual visit was cancelled because it was determined that he needed to be seen in the office. Earlier appt requested. appt sch on 06/03/19 @ 9:30 am with CLucillie GarfinkelPt will need SIvin Bootyto accompany him, he is a poor historian and may  not quite understand the information given by the provider. AMarjie Skiffthat they will need to enter through the medical mall entrance. Face mask are required for everyone. COVID 19 screening done.      COVID-19 Pre-Screening Questions:  . In the past 7 to 10 days have you had a cough,  shortness of breath, headache, congestion, fever (100 or greater) body aches, chills, sore throat, or sudden loss of taste or sense of smell? No . Have you been around anyone with known Covid 19. No . Have you been around anyone who is awaiting Covid 19 test results in the past 7 to 10 days? No . Have you been around anyone who has been exposed to Covid 19, or has mentioned symptoms of Covid 19 within the past 7 to 10 days? No

## 2019-06-02 NOTE — Telephone Encounter (Signed)
Pt c/o swelling: STAT is pt has developed SOB within 24 hours  1) How much weight have you gained and in what time span? 2 lbs 2 wks   2) If swelling, where is the swelling located? Both feet and ankles   3) Are you currently taking a fluid pill? Yes lasix increased to 40 mg by pcp   4) Are you currently SOB? No   5) Do you have a log of your daily weights (if so, list)? No   6) Have you gained 3 pounds in a day or 5 pounds in a week?  No   7) Have you traveled recently? no   Patient also has questions about :  Why no PCI with Cath   Why on 2 blood thinners   Can he be on these blood thinners and still do colonoscopy

## 2019-06-02 NOTE — Progress Notes (Signed)
Patient c/o lower back pain ( pain level 7) with bilateral feet swollen x 14 days. Constant pain

## 2019-06-03 ENCOUNTER — Encounter: Payer: Self-pay | Admitting: Nurse Practitioner

## 2019-06-03 ENCOUNTER — Ambulatory Visit (INDEPENDENT_AMBULATORY_CARE_PROVIDER_SITE_OTHER): Payer: Medicare Other | Admitting: Nurse Practitioner

## 2019-06-03 VITALS — BP 134/78 | HR 57 | Ht 68.0 in | Wt 265.0 lb

## 2019-06-03 DIAGNOSIS — I480 Paroxysmal atrial fibrillation: Secondary | ICD-10-CM | POA: Diagnosis not present

## 2019-06-03 DIAGNOSIS — I5023 Acute on chronic systolic (congestive) heart failure: Secondary | ICD-10-CM

## 2019-06-03 DIAGNOSIS — I10 Essential (primary) hypertension: Secondary | ICD-10-CM

## 2019-06-03 DIAGNOSIS — I255 Ischemic cardiomyopathy: Secondary | ICD-10-CM | POA: Diagnosis not present

## 2019-06-03 DIAGNOSIS — N183 Chronic kidney disease, stage 3 unspecified: Secondary | ICD-10-CM

## 2019-06-03 DIAGNOSIS — I2510 Atherosclerotic heart disease of native coronary artery without angina pectoris: Secondary | ICD-10-CM

## 2019-06-03 DIAGNOSIS — E785 Hyperlipidemia, unspecified: Secondary | ICD-10-CM

## 2019-06-03 DIAGNOSIS — R0683 Snoring: Secondary | ICD-10-CM

## 2019-06-03 LAB — KAPPA/LAMBDA LIGHT CHAINS
Kappa free light chain: 35.8 mg/L — ABNORMAL HIGH (ref 3.3–19.4)
Kappa, lambda light chain ratio: 1.75 — ABNORMAL HIGH (ref 0.26–1.65)
Lambda free light chains: 20.5 mg/L (ref 5.7–26.3)

## 2019-06-03 MED ORDER — APIXABAN 5 MG PO TABS: 5.0000 mg | ORAL_TABLET | Freq: Two times a day (BID) | ORAL | 5 refills | Status: DC

## 2019-06-03 MED ORDER — FUROSEMIDE 40 MG PO TABS: 40.0000 mg | ORAL_TABLET | Freq: Every day | ORAL | 5 refills | Status: DC

## 2019-06-03 NOTE — Progress Notes (Signed)
Office Visit    Patient Name: Jeff Henderson Date of Encounter: 06/03/2019  Primary Care Provider:  [name], MD Primary Cardiologist:  [name], MD  Chief Complaint    78 y/o ? w/ a h/o CAD s/p remote CABG and recent NSTEMI (stable anatomy, 2/3 patent grafts), HTN, HL, ICM (EF 30-35%), HFrEF, PAF (CHA2DS2VASc = 5  Eliquis), CKD III, and elevated free light chains, who presents for f/u related to lower ext edema.  Past Medical History    Past Medical History:  Diagnosis Date   Arthritis    CAD (coronary artery disease)    a. 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 06/2018 Cath: Native 3VD, 2/3 patent grafts (VG->OM2 100)->Med rx; c. 05/2019 NSTEMI/Cath:  LM nl, LAD 99ost, 175m LCX 108mRCA 100p, LIMA->LAD ok, VG->RPDA ok (? jump to OM), VG->OM not visualized (prev occluded). LVEDP 3046m-->Med Rx.   CKD (chronic kidney disease), stage III (HCC)    Gout    Hernia, abdominal    [place]EF (heart [place] with reduced [place] fraction) [place]  a. 06/2018 Echo: EF 45-50%, mild MR; b. 05/2019 Echo: EF 30-35%, diff HK.   Hyperlipidemia LDL goal <70    Hypertension    Ischemic cardiomyopathy    a. 06/2018 Echo: EF 45-50%; b. 05/2019 Echo: EF 30-35%, diff HK.   Myocardial infarction (HCC)    [place]  [place]. [place] 05/[place]-->CHA2DS2VASc = 5-->Eliquis.   Past Surgical History:  Procedure Laterality Date   carpel tunnel     LEFT HEART CATH AND CORONARY ANGIOGRAPHY N/A 06/03/2018   Procedure: LEFT HEART CATH AND CORS/GRAFTS ANGIOGRAPHY;  Surgeon: ParIsaias CowmanD;  [facility];  Service: Cardiovascular;  Laterality: N/A;   LEFT HEART CATH AND CORS/GRAFTS ANGIOGRAPHY Bilateral 05/07/2019   Procedure: LEFT HEART CATH AND CORS/GRAFTS ANGIOGRAPHY;  Surgeon: GolMinna MerrittsD;  [facility];  Service: Cardiovascular;  Laterality: Bilateral;   REPLACEMENT TOTAL KNEE     VEIN BYPASS  SURGERY      Allergies  Allergies  Allergen Reactions   Penicillins Rash and Other (See Comments)    Has patient had a PCN reaction causing immediate rash, facial/tongue/throat swelling, SOB or lightheadedness with hypotension: No Has patient had a PCN reaction causing severe rash involving mucus membranes or skin necrosis: No Has patient had a PCN reaction that required hospitalization: No Has patient had a PCN reaction occurring within the last 10 years: No If all of the above answers are "NO", then may proceed with Cephalosporin use.     History of Present Illness    [age]o ? with the above complex PMH including CAD s/p CABG x 3 in 1995, HTN, HL, obesity, ICM, HFrEF, CKD III, PAD, carotid dzs, and elevated free light chains.  He was found to have 3 vessel native occlusive dzs w/ 2/3 patent grafts by cath back in 2011 (VG  OM2 occluded).  Repeat cath in 05/2018 in the setting of NSTEMI, showed stable anatomy w/ a 99% stenosis in the proximal LAD that was supplying two small diag branches.  He was medically managed and did well until early June 2020, when he was readmitted w/ chest pain and NSTEMI in the setting of new Afib w/ RVR.  He converted to sinus on his own (long pauses reported but not formally documented).  Trop peaked @ 50.  In that setting cath was performed and showed stable  anatomy/grafts.  Med Rx was again rec.  ASA was d/c'd (plavix continued) and he was placed on Eliquis.  Echo during admission showed reduced EF @ 30-35%.  At f/u virtual visit on 6/10, he reported doing well (of note, he does not remember having a phone conversation with our office), but shortly thereafter, he began to note an increase in lower ext swelling.  His girlfriend recommended that he increase his Lasix from 20 mg daily to 40 mg daily.  He saw his primary care provider, who agreed.  Unfortunately, he ran out of his Lasix about a week ago and since then has been having ongoing lower extremity swelling.  He  does not own a scale and has not been weighing himself at home.  He is careful with his salt intake and either he, his daughter, or his girlfriend, typically prepare meals for him.  Despite lower extremity swelling, he has not been having any dyspnea, chest pain, palpitations, PND, orthopnea, dizziness, syncope, or early satiety.  Home Medications    Prior to Admission medications   Medication Sig Start Date End Date Taking? Authorizing Provider  amLODipine-benazepril (LOTREL) 10-40 MG capsule Take 1 capsule by mouth daily.  10/19/14   [provider]  apixaban (ELIQUIS) 5 MG TABS tablet Take 1 tablet (5 mg total) by mouth 2 (two) times daily. 05/08/19   Dustin Flock, MD  atorvastatin (LIPITOR) 80 MG tablet Take 80 mg by mouth daily.     [provider]  carvedilol (COREG) 25 MG tablet Take 1 tablet (25 mg total) by mouth 2 (two) times daily with a meal. 05/13/19   Theora Gianotti, NP  clopidogrel (PLAVIX) 75 MG tablet Take 75 mg by mouth daily.     [provider]  Coenzyme Q10 (COQ10) 100 MG CAPS Take 100 mg by mouth daily.    [provider]  fluticasone (FLONASE) 50 MCG/ACT nasal spray Place 1 spray into both nostrils 2 (two) times a day.    [provider]  furosemide (LASIX) 20 MG tablet Take 1 tablet (20 mg total) by mouth daily. 05/09/19   Dustin Flock, MD  isosorbide mononitrate (IMDUR) 60 MG 24 hr tablet Take 60 mg by mouth daily.    [provider]  Multiple Vitamins-Minerals (MENS 50+ MULTI VITAMIN/MIN) TABS Take 1 tablet by mouth daily.     [provider]  nitroGLYCERIN (NITROSTAT) 0.4 MG SL tablet Place 1 tablet (0.4 mg total) under the tongue every 5 (five) minutes as needed for chest pain (Nitro if taking third dose, call 911). Patient not taking: Reported on 06/02/2019 05/08/19   Marrianne Mood D, PA-C  Omega-3 1000 MG CAPS Take 1,000 mg by mouth.    [provider]    Review of Systems      Increasing lower extremity swelling as outlined above.  He denies chest pain, palpitations, dyspnea, PND, orthopnea, dizziness, syncope, or early satiety.  All other systems reviewed and are otherwise negative except as noted above.  Physical Exam    VS:  BP 134/78 (BP Location: Left Arm, Patient Position: Sitting, Cuff Size: Normal)    Pulse (!) 57    Ht 5' 8"  (1.727 m)    Wt 265 lb (120.2 kg)    BMI 40.29 kg/m  , BMI Body mass index is 40.29 kg/m. GEN: obese, in no acute distress. HEENT: normal. Neck: Supple, obese, difficult to gauge JVP, no carotid bruits, or masses. Cardiac: RRR, no murmurs, rubs, or gallops. No  clubbing, cyanosis.  1+ RLE and 2+ LLE edema to the calves.  Radials/DP/PT 1+ and equal bilaterally.  Respiratory:  Respirations regular and unlabored, clear to auscultation bilaterally. GI: Obese, protuberant and firm, nontender, BS + x 4. MS: no deformity or atrophy. Skin: warm and dry, no rash. Neuro:  Strength and sensation are intact. Psych: Normal affect.  Accessory Clinical Findings    ECG personally reviewed by me today - sinus bradycardia, 57, no acute changes.  Lab Results  Component Value Date   WBC 7.0 06/02/2019   HGB 11.2 (L) 06/02/2019   HCT 34.0 (L) 06/02/2019   MCV 96.6 06/02/2019   PLT 132 (L) 06/02/2019   Lab Results  Component Value Date   CREATININE 1.76 (H) 06/02/2019   BUN 41 (H) 06/02/2019   NA 138 06/02/2019   K 4.1 06/02/2019   CL 105 06/02/2019   CO2 25 06/02/2019   Assessment & Plan    1.  CAD/NSTEMI - subsequent episode of care: Patient with known severe/occlusive native vessel disease status post remote CABG in 1995 with non-STEMI in July 2019 and recurrent non-STEMI in June 2020 in the setting of rapid atrial fibrillation.  Cath showed stable anatomy with severe native disease and patent LIMA to the LAD and vein graft to the PDA with flow to the obtuse marginal.  He had a tight proximal LAD stenosis that was supplying small  diagonal branches.  Medical therapy was recommended.  Since discharge, he has not been having any chest pain or dyspnea.  He remains on beta-blocker, statin, Plavix, and long-acting nitrate therapy.  He is going to do virtual cardiac rehabilitation at home and has already spoken with rehab staff.  2.  Acute on chronic systolic congestive heart failure/ischemic cardiomyopathy: EF 30 to 35% by echo at the time of his event in June.  He was discharged home on Lasix 20 mg daily as well as beta-blocker and ACE inhibitor therapy.  He began to experience lower extremity swelling a few weeks ago and started doubling up on his Lasix but never sought a refill and he has been out of Lasix for roughly a week.  He does not own a scale at home and has not been weighing himself.  He does have lower extremity edema.  I will add back Lasix 40 mg daily with a plan for a follow-up basic metabolic panel in 1 week given his history of stage III chronic kidney disease.  Creatinine was 1.76 yesterday which is in the high end of his usual range.  He has had some confusion about his medications and in that setting, I am not going to pursue initiating Entresto at this time, especially since his ACE inhibitor as part of combination tablet (Lotrel).  We can reconsider this in the future.  If renal function stable on a steady dose of Lasix, we can consider spironolactone in the future as well.  We discussed the importance of daily weights, sodium restriction, medication compliance, and symptom reporting and he verbalizes understanding.   3.  Paroxysmal atrial fibrillation: Patient was found to be in A. fib with RVR during hospitalization in June which likely created significant demand ischemia given severe underlying multivessel disease.  He spontaneously converted while in the hospital.  He has not had any palpitations or recurrent chest pain/dyspnea.  He remains in sinus rhythm today and is bradycardic at a rate of 57.  He remains on  carvedilol 25 mg twice daily.  He is anticoagulated with Eliquis  in the setting of the CHA2DS2VASc of 4.  He had follow-up labs yesterday which showed relatively stable H&H.  We will have to reduce his Eliquis dose next February, when he turns 79.  I will also arrange for referral to pulmonology for sleep study.  4.  Essential hypertension: Blood pressure mildly elevated today.  Adding back Lasix.  Continue beta-blocker, amlodipine, ACE inhibitor, and nitrate.  5.  Hyperlipidemia: He is on high potency statin therapy and this is followed by his primary care provider.  We do not have any lipids on file.  6.  Stage III chronic kidney disease: Relatively stable by evaluation yesterday.  Was on the higher end of his usual trend.  He does have follow-up planned with nephrology.  Continue ACE inhibitor with plan to follow-up basic metabolic panel in 1 week given resumption of Lasix.  7.  Snoring: Patient and girlfriend say that he has a history of snoring.  We discussed how sleep apnea may contribute to recurrence of A. fib.  He is not sure if he wants a referral for sleep study but will consider.  8.  Disposition: Follow-up basic metabolic panel in a week.  Sending in refills for Eliquis and Lasix today.  Will refer to pulmonology for sleep study.  Follow-up in clinic in 1 month or sooner if necessary.   Murray Hodgkins, NP 06/03/2019, 10:38 AM

## 2019-06-03 NOTE — Patient Instructions (Addendum)
Medication Instructions:  Your physician has recommended you make the following change in your medication:   INCREASE Lasix to 55m daily  Lasix and Eliquis have been refilled today  If you need a refill on your cardiac medications before your next appointment, please call your pharmacy.   Lab work: Your physician recommends that you return for lab work in: 1 week  [facility] If you have labs (blood work) drawn today and your tests are completely normal, you will receive your results only by: .Marland KitchenMyChart Message (if you have MyChart) OR . A paper copy in the mail If you have any lab test that is abnormal or we need to change your treatment, we will call you to review the results.  Testing/Procedures: None ordered  Follow-Up: [facility] and your health needs are our priority.  As part of our continuing mission to provide you with exceptional heart care, we have created designated Provider Care Teams.  These Care Teams include your primary Cardiologist (physician) and Advanced Practice Providers (APPs -  Physician Assistants and Nurse Practitioners) who all work together to provide you with the care you need, when you need it. You will need a follow up appointment in 4 weeks.  You may see MKathlyn Sacramento MD or one of the following Advanced Practice Providers on your designated Care Team:   CMurray Hodgkins NP  [facility] They will call you to schedule the appointment.

## 2019-06-03 NOTE — Telephone Encounter (Signed)
Patient was seen in office today by provider.

## 2019-06-04 LAB — MULTIPLE MYELOMA PANEL, SERUM
Albumin SerPl Elph-Mcnc: 3.5 g/dL (ref 2.9–4.4)
Albumin/Glob SerPl: 1.3 (ref 0.7–1.7)
Alpha 1: 0.2 g/dL (ref 0.0–0.4)
Alpha2 Glob SerPl Elph-Mcnc: 0.9 g/dL (ref 0.4–1.0)
B-Globulin SerPl Elph-Mcnc: 0.8 g/dL (ref 0.7–1.3)
Gamma Glob SerPl Elph-Mcnc: 0.8 g/dL (ref 0.4–1.8)
Globulin, Total: 2.7 g/dL (ref 2.2–3.9)
IgA: 262 mg/dL (ref 61–437)
IgG (Immunoglobin G), Serum: 798 mg/dL (ref 603–1613)
IgM (Immunoglobulin M), Srm: 75 mg/dL (ref 15–143)
Total Protein ELP: 6.2 g/dL (ref 6.0–8.5)

## 2019-06-08 ENCOUNTER — Encounter: Payer: Medicare Other | Attending: Cardiovascular Disease | Admitting: *Deleted

## 2019-06-08 ENCOUNTER — Other Ambulatory Visit: Payer: Self-pay

## 2019-06-08 DIAGNOSIS — I214 Non-ST elevation (NSTEMI) myocardial infarction: Secondary | ICD-10-CM

## 2019-06-08 NOTE — Progress Notes (Signed)
Hope swelling will go down in feet. Walking is going slowly; can't even put shoes on. Having blood work down, will talk to doctor this week.

## 2019-06-09 ENCOUNTER — Other Ambulatory Visit
Admission: RE | Admit: 2019-06-09 | Discharge: 2019-06-09 | Disposition: A | Discharge status: Home / Self Care | Payer: Medicare Other | Source: Ambulatory Visit | Attending: Nurse Practitioner | Admitting: Nurse Practitioner

## 2019-06-09 ENCOUNTER — Telehealth: Payer: Self-pay | Admitting: *Deleted

## 2019-06-09 DIAGNOSIS — I480 Paroxysmal atrial fibrillation: Secondary | ICD-10-CM | POA: Diagnosis present

## 2019-06-09 DIAGNOSIS — I5023 Acute on chronic systolic (congestive) heart failure: Secondary | ICD-10-CM

## 2019-06-09 DIAGNOSIS — Z79899 Other long term (current) drug therapy: Secondary | ICD-10-CM

## 2019-06-09 LAB — BASIC METABOLIC PANEL WITH GFR
Anion gap: 9 (ref 5–15)
BUN: 46 mg/dL — ABNORMAL HIGH (ref 8–23)
CO2: 27 mmol/L (ref 22–32)
Calcium: 9.2 mg/dL (ref 8.9–10.3)
Chloride: 104 mmol/L (ref 98–111)
Creatinine, Ser: 2 mg/dL — ABNORMAL HIGH (ref 0.61–1.24)
GFR calc Af Amer: 36 mL/min — ABNORMAL LOW (ref >60)
GFR calc non Af Amer: 31 mL/min — ABNORMAL LOW (ref >60)
Glucose, Bld: 122 mg/dL — ABNORMAL HIGH (ref 70–99)
Potassium: 4.2 mmol/L (ref 3.5–5.1)
Sodium: 140 mmol/L (ref 135–145)

## 2019-06-09 MED ORDER — FUROSEMIDE 40 MG PO TABS: 20.0000 mg | ORAL_TABLET | Freq: Every day | ORAL | 5 refills | Status: DC

## 2019-06-09 NOTE — Telephone Encounter (Signed)
Results called to pt. Pt verbalized understanding. He verbalized understanding to decrease lasix to 20 mg daily, keep feet elevated, limit salt and go to [place] in 1 week for repeat labs. Med list update. BMET order entered.

## 2019-06-09 NOTE — Telephone Encounter (Signed)
-----   Message from Theora Gianotti, NP sent at 06/09/2019  3:11 PM EDT ----- BUN and creatinine continue to rise.  Please have him reduce lasix to 73m daily, keep legs elevated when sitting, and limit salt intake. F/u bmet in 1 wk to assess stability.  F/u w/ Dr. AFletcher Anonin August as planned.

## 2019-06-18 ENCOUNTER — Ambulatory Visit: Payer: Medicare Other | Admitting: Nurse Practitioner

## 2019-06-18 ENCOUNTER — Other Ambulatory Visit
Admission: RE | Admit: 2019-06-18 | Discharge: 2019-06-18 | Disposition: A | Discharge status: Home / Self Care | Payer: Medicare Other | Source: Ambulatory Visit | Attending: Nurse Practitioner | Admitting: Nurse Practitioner

## 2019-06-18 DIAGNOSIS — I5023 Acute on chronic systolic (congestive) heart failure: Secondary | ICD-10-CM | POA: Diagnosis present

## 2019-06-18 DIAGNOSIS — Z79899 Other long term (current) drug therapy: Secondary | ICD-10-CM | POA: Insufficient documentation

## 2019-06-18 LAB — BASIC METABOLIC PANEL
Anion gap: 9 (ref 5–15)
BUN: 36 mg/dL — ABNORMAL HIGH (ref 8–23)
CO2: 25 mmol/L (ref 22–32)
Calcium: 9.4 mg/dL (ref 8.9–10.3)
Chloride: 107 mmol/L (ref 98–111)
Creatinine, Ser: 1.76 mg/dL — ABNORMAL HIGH (ref 0.61–1.24)
GFR calc Af Amer: 42 mL/min — ABNORMAL LOW (ref >60)
GFR calc non Af Amer: 36 mL/min — ABNORMAL LOW (ref >60)
Glucose, Bld: 113 mg/dL — ABNORMAL HIGH (ref 70–99)
Potassium: 4.1 mmol/L (ref 3.5–5.1)
Sodium: 141 mmol/L (ref 135–145)

## 2019-06-19 NOTE — Progress Notes (Signed)
Counselor contacted client as requested by staff.  Patient spoke of family issues and assisting daughter in a move.  Patient declined on-going follow up.  He stated that he would call if he needed.

## 2019-06-20 ENCOUNTER — Other Ambulatory Visit: Payer: Self-pay

## 2019-06-20 ENCOUNTER — Encounter: Payer: Self-pay | Admitting: Emergency Medicine

## 2019-06-20 ENCOUNTER — Emergency Department: Payer: Medicare Other

## 2019-06-20 ENCOUNTER — Emergency Department
Admission: EM | Admit: 2019-06-20 | Discharge: 2019-06-20 | Disposition: A | Discharge status: Home / Self Care | Payer: Medicare Other | Attending: Emergency Medicine | Admitting: Emergency Medicine

## 2019-06-20 DIAGNOSIS — Z7901 Long term (current) use of anticoagulants: Secondary | ICD-10-CM | POA: Insufficient documentation

## 2019-06-20 DIAGNOSIS — Z951 Presence of aortocoronary bypass graft: Secondary | ICD-10-CM | POA: Diagnosis not present

## 2019-06-20 DIAGNOSIS — Y9301 Activity, walking, marching and hiking: Secondary | ICD-10-CM | POA: Diagnosis not present

## 2019-06-20 DIAGNOSIS — Y92014 Private driveway to single-family (private) house as the place of occurrence of the external cause: Secondary | ICD-10-CM | POA: Insufficient documentation

## 2019-06-20 DIAGNOSIS — I2510 Atherosclerotic heart disease of native coronary artery without angina pectoris: Secondary | ICD-10-CM | POA: Insufficient documentation

## 2019-06-20 DIAGNOSIS — W010XXA Fall on same level from slipping, tripping and stumbling without subsequent striking against object, initial encounter: Secondary | ICD-10-CM | POA: Insufficient documentation

## 2019-06-20 DIAGNOSIS — S0003XA Contusion of scalp, initial encounter: Secondary | ICD-10-CM | POA: Insufficient documentation

## 2019-06-20 DIAGNOSIS — I480 Paroxysmal atrial fibrillation: Secondary | ICD-10-CM | POA: Diagnosis not present

## 2019-06-20 DIAGNOSIS — I130 Hypertensive heart and chronic kidney disease with heart failure and stage 1 through stage 4 chronic kidney disease, or unspecified chronic kidney disease: Secondary | ICD-10-CM | POA: Diagnosis not present

## 2019-06-20 DIAGNOSIS — Y999 Unspecified external cause status: Secondary | ICD-10-CM | POA: Diagnosis not present

## 2019-06-20 DIAGNOSIS — N183 Chronic kidney disease, stage 3 (moderate): Secondary | ICD-10-CM | POA: Diagnosis not present

## 2019-06-20 DIAGNOSIS — Z23 Encounter for immunization: Secondary | ICD-10-CM | POA: Diagnosis not present

## 2019-06-20 DIAGNOSIS — I5089 Other heart failure: Secondary | ICD-10-CM | POA: Diagnosis not present

## 2019-06-20 DIAGNOSIS — I252 Old myocardial infarction: Secondary | ICD-10-CM | POA: Insufficient documentation

## 2019-06-20 MED ORDER — TETANUS-DIPHTH-ACELL PERTUSSIS 5-2.5-18.5 LF-MCG/0.5 IM SUSP
0.5000 mL | Freq: Once | INTRAMUSCULAR | Status: AC
  Administered 2019-06-20: 0.5 mL via INTRAMUSCULAR
  Filled 2019-06-20: qty 0.5

## 2019-06-20 NOTE — ED Notes (Signed)
Pt to room from CT

## 2019-06-20 NOTE — ED Notes (Signed)
Pt provided with urinal to void. EDT jacob at bedside at this time.

## 2019-06-20 NOTE — ED Notes (Signed)
Patient transported to CT 

## 2019-06-20 NOTE — ED Notes (Signed)
ED Provider Joni Fears at bedside.

## 2019-06-20 NOTE — ED Notes (Addendum)
This RN has cleaned Face/forehand/hands with NS and sterile gauze laceration noted on left side of forehead. Pt A/Ox4.

## 2019-06-20 NOTE — ED Provider Notes (Signed)
[facility] Emergency Department Provider Note  ____________________________________________  Time seen: Approximately 2:39 PM  I have reviewed the triage vital signs and the nursing notes.   HISTORY  Chief Complaint Fall    HPI Jeff Henderson is a 78 y.o. male with a history of paroxysmal atrial fibrillation, CAD, systolic heart failure, hypertension who comes the ED after mechanical fall at home .  He tripped on a crack in the driveway, falling forward and hitting his forehead.  No LOC.  Denies any pain.  He is on Eliquis.  No vision changes paresthesias or weakness.  Reports that he feels just fine but needed to be cautious.     Past Medical History:  Diagnosis Date  . Arthritis   . CAD (coronary artery disease)    a. 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 06/2018 Cath: Native 3VD, 2/3 patent grafts (VG->OM2 100)->Med rx; c. 05/2019 NSTEMI/Cath:  LM nl, LAD 99ost, 110m LCX 1091mRCA 100p, LIMA->LAD ok, VG->RPDA ok (? jump to OM), VG->OM not visualized (prev occluded). LVEDP 3058m-->Med Rx.  . CKD (chronic kidney disease), stage III ([place]rinity . Gout   . Hernia, abdominal   . HFrEF (heart failure with reduced ejection fraction) ([place]CC[place]  a. 06/2018 Echo: EF 45-50%, mild MR; b. 05/2019 Echo: EF 30-35%, diff HK.  . HMarland Kitchenperlipidemia LDL goal <70   . Hypertension   . Ischemic cardiomyopathy    a. 06/2018 Echo: EF 45-50%; b. 05/2019 Echo: EF 30-35%, diff HK.  . Myocardial infarction ([place] . PAF (paroxysmal atrial fibrillation) ([place]CC[place]  a. Dx 05/2019-->CHA2DS2VASc = 5-->Eliquis.     Patient Active Problem List   Diagnosis Date Noted  . Elevated serum immunoglobulin free light chains 06/02/2019  . Hyperlipidemia LDL goal <70   . CAD (coronary artery disease)   . A-fib ([place]CC[place]6/02/2019  . NSTEMI (non-ST elevated myocardial infarction) (HCC[place]6/30/2019  . CKD (chronic kidney disease), stage III (HCCSan [place]6/29/2019  . Chest pain 05/31/2018  .  Dupuytren's contracture of left hand 05/19/2018  . Hyperlipidemia, mixed 10/11/2017  . Umbilical hernia without obstruction and without gangrene 12/20/2015  . DDD (degenerative disc disease), cervical 08/01/2015  . 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 Arteriosclerosis of coronary artery 12/17/2014  . HTN (hypertension) 12/17/2014  . Impaired renal function 12/17/2014  . Kidney cysts 10/23/2012  . 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 Polypharmacy 01/30/2012  . Arthritis, degenerative 01/30/2012     Past Surgical History:  Procedure Laterality Date  . carpel tunnel    . LEFT HEART CATH AND CORONARY ANGIOGRAPHY N/A 06/03/2018   Procedure: LEFT HEART CATH AND CORS/GRAFTS ANGIOGRAPHY;  Surgeon: ParIsaias CowmanD;  [facility];  Service: Cardiovascular;  Laterality: N/A;  . LEFT HEART CATH AND CORS/GRAFTS ANGIOGRAPHY Bilateral 05/07/2019   Procedure: LEFT HEART CATH AND CORS/GRAFTS ANGIOGRAPHY;  Surgeon: GolMinna MerrittsD;  [facility];  Service: Cardiovascular;  Laterality: Bilateral;  . REPLACEMENT TOTAL KNEE    . VEIN BYPASS SURGERY       Prior to Admission medications   Medication Sig Start Date End Date Taking? Authorizing Provider  amLODipine-benazepril (LOTREL) 10-40 MG capsule Take 1 capsule by mouth daily.  10/19/14   [provider]  apixaban (ELIQUIS) 5 MG TABS tablet Take 1 tablet (5 mg total) by mouth 2 (two) times daily. 06/03/19   BerTheora GianottiP  atorvastatin (LIPITOR) 80 MG tablet Take 80  mg by mouth daily.     [provider]  carvedilol (COREG) 25 MG tablet Take 1 tablet (25 mg total) by mouth 2 (two) times daily with a meal. 05/13/19   Theora Gianotti, NP  clopidogrel (PLAVIX) 75 MG tablet Take 75 mg by mouth daily.     [provider]  Coenzyme Q10 (COQ10) 100 MG CAPS Take 100 mg by mouth daily.    [provider]  fluticasone (FLONASE) 50 MCG/ACT nasal spray Place 1 spray  into both nostrils 2 (two) times a day.    [provider]  furosemide (LASIX) 40 MG tablet Take 0.5 tablets (20 mg total) by mouth daily. 06/09/19   Theora Gianotti, NP  isosorbide mononitrate (IMDUR) 60 MG 24 hr tablet Take 60 mg by mouth daily.    [provider]  Multiple Vitamins-Minerals (MENS 50+ MULTI VITAMIN/MIN) TABS Take 1 tablet by mouth daily.     [provider]  nitroGLYCERIN (NITROSTAT) 0.4 MG SL tablet Place 1 tablet (0.4 mg total) under the tongue every 5 (five) minutes as needed for chest pain (Nitro if taking third dose, call 911). 05/08/19   Marrianne Mood D, PA-C  Omega-3 1000 MG CAPS Take 1,000 mg by mouth.    [provider]     Allergies Penicillins   Family History  Problem Relation Age of Onset  . Asthma Mother   . Diabetes Father   . Hypertension Father     Social History Social History   Tobacco Use  . Smoking status: Never Smoker  . Smokeless tobacco: Never Used  Substance Use Topics  . Alcohol use: No    Alcohol/week: 0.0 standard drinks  . Drug use: No    Review of Systems  Constitutional:   No fever or chills.  ENT:   No sore throat. No rhinorrhea. Cardiovascular:   No chest pain or syncope. Respiratory:   No dyspnea or cough. Gastrointestinal:   Negative for abdominal pain, vomiting and diarrhea.  Musculoskeletal:   Bleeding from left fifth finger. All other systems reviewed and are negative except as documented above in ROS and HPI.  ____________________________________________   PHYSICAL EXAM:  VITAL SIGNS: ED Triage Vitals  Enc Vitals Group     BP 06/20/19 1258 136/65     Pulse Rate 06/20/19 1258 (!) 56     Resp 06/20/19 1258 19     Temp 06/20/19 1258 98.6 F (37 C)     Temp Source 06/20/19 1258 Oral     SpO2 06/20/19 1257 95 %     Weight 06/20/19 1259 259 lb (117.5 kg)     Height 06/20/19 1259 5' 8"  (1.727 m)     Head Circumference --      Peak Flow --      Pain Score  06/20/19 1258 8     Pain Loc --      Pain Edu? --      Excl. in [place]? --     Vital signs reviewed, nursing assessments reviewed.   Constitutional:   Alert and oriented. Non-toxic appearance. Eyes:   Conjunctivae are normal. EOMI. PERRL. ENT      Head:   Normocephalic with scalp hematoma about 4 cm.  Overlying abrasion but no laceration.  Hemostatic.      Nose:   No congestion/rhinnorhea.  No epistaxis      Mouth/Throat:   MMM, no pharyngeal erythema. No peritonsillar mass.  No intraoral injury  Neck:   No meningismus. Full ROM.  No midline spinal tenderness Hematological/Lymphatic/Immunilogical:   No cervical lymphadenopathy. Cardiovascular:   RRR. Symmetric bilateral radial and DP pulses.  No murmurs. Cap refill less than 2 seconds. Respiratory:   Normal respiratory effort without tachypnea/retractions. Breath sounds are clear and equal bilaterally. No wheezes/rales/rhonchi. Gastrointestinal:   Soft and nontender. Non distended. There is no CVA tenderness.  No rebound, rigidity, or guarding.  Musculoskeletal:   Normal range of motion in all extremities. No joint effusions.  No lower extremity tenderness.  No edema.  No bony point tenderness. Neurologic:   Normal speech and language.  Motor grossly intact. No acute focal neurologic deficits are appreciated.  Skin:    Skin is warm, dry with small skin tear over the left fifth finger MCP, hemostatic.Marland Kitchen No rash noted.  No petechiae, purpura, or bullae.  ____________________________________________    LABS (pertinent positives/negatives) (all labs ordered are listed, but only abnormal results are displayed) Labs Reviewed - No data to display ____________________________________________   EKG    ____________________________________________    RADIOLOGY  Ct Head Wo Contrast  Result Date: 06/20/2019 CLINICAL DATA:  Fall, blood thinners EXAM: CT HEAD WITHOUT CONTRAST CT CERVICAL SPINE WITHOUT CONTRAST TECHNIQUE: Multidetector  CT imaging of the head and cervical spine was performed following the standard protocol without intravenous contrast. Multiplanar CT image reconstructions of the cervical spine were also generated. COMPARISON:  MR brain, 09/18/2016 FINDINGS: CT HEAD FINDINGS Brain: No evidence of acute infarction, hemorrhage, hydrocephalus, extra-axial collection or mass lesion/mass effect. Mild periventricular white matter hypodensity. 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 Vascular: No hyperdense vessel or unexpected calcification. Skull: Normal. Negative for fracture or focal lesion. Sinuses/Orbits: No acute finding. Other: Soft tissue hematoma of the left forehead. CT CERVICAL SPINE FINDINGS Alignment: Normal. Skull base and vertebrae: No acute fracture. 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 No primary bone lesion or focal pathologic process. Soft tissues and spinal canal: No prevertebral fluid or swelling. No visible canal hematoma. Disc levels: Moderate to severe multilevel disc degenerative disease and osteophytosis. Upper chest: Negative. Other: None. IMPRESSION: 1. No acute intracranial pathology. Small-vessel white matter disease. 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 2.  Soft tissue hematoma of the left forehead. 3.  No fracture or static subluxation of the cervical spine. Electronically Signed   By: Eddie Candle M.D.   On: 06/20/2019 13:27   Ct Cervical Spine Wo Contrast  Result Date: 06/20/2019 CLINICAL DATA:  Fall, blood thinners EXAM: CT HEAD WITHOUT CONTRAST CT CERVICAL SPINE WITHOUT CONTRAST TECHNIQUE: Multidetector CT imaging of the head and cervical spine was performed following the standard protocol without intravenous contrast. Multiplanar CT image reconstructions of the cervical spine were also generated. COMPARISON:  MR brain, 09/18/2016 FINDINGS: CT HEAD FINDINGS Brain: No evidence of acute infarction, hemorrhage, hydrocephalus, extra-axial collection or  mass lesion/mass effect. Mild periventricular white matter hypodensity. 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 Vascular: No hyperdense vessel or unexpected calcification. Skull: Normal. Negative for fracture or focal lesion. Sinuses/Orbits: No acute finding. Other: Soft tissue hematoma of the left forehead. CT CERVICAL SPINE FINDINGS Alignment: Normal. Skull base and vertebrae: No acute fracture. 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 No primary bone lesion or focal pathologic process. Soft tissues and spinal canal: No prevertebral fluid or swelling. No visible canal hematoma. Disc levels: Moderate to severe multilevel disc degenerative disease and osteophytosis. Upper chest: Negative. Other: None. IMPRESSION: 1. No acute intracranial pathology. Small-vessel white matter disease.  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 2.  Soft tissue hematoma of the left forehead. 3.  No fracture or static subluxation of the cervical spine. Electronically Signed   By: Eddie Candle M.D.   On: 06/20/2019 13:27    ____________________________________________   PROCEDURES Procedures  ____________________________________________  DIFFERENTIAL DIAGNOSIS   Epidural hematoma, subdural hematoma, intra-cerebral hemorrhage.  Cervical spine fracture  CLINICAL IMPRESSION / ASSESSMENT AND PLAN / ED COURSE  Medications ordered in the ED: Medications  Tdap (BOOSTRIX) injection 0.5 mL (has no administration in time range)    Pertinent labs & imaging results that were available during my care of the patient were reviewed by me and considered in my medical decision making (see chart for details).  Rozell Theiler was evaluated in Emergency Department on 06/20/2019 for the symptoms described in the history of present illness. He was evaluated in the context of the global COVID-19 pandemic, which necessitated consideration that the patient might be at risk for infection with the  SARS-CoV-2 virus that causes COVID-19. Institutional protocols and algorithms that pertain to the evaluation of patients at risk for COVID-19 are in a state of rapid change based on information released by regulatory bodies including the CDC and federal and state organizations. These policies and algorithms were followed during the patient's care in the ED.   Patient presents with frontal head trauma after a trip and fall.  He is on Eliquis.  CT imaging negative for any acute injury.  Does have scattered abrasions and a scalp hematoma, which is all self-limited and can be expected to heal on its own.  Tetanus updated today.  Counseled on concussion precautions, follow-up with primary care.      ____________________________________________   FINAL CLINICAL IMPRESSION(S) / ED DIAGNOSES    Final diagnoses:  Hematoma of scalp, initial encounter     ED Discharge Orders    None      Portions of this note were generated with dragon dictation software. Dictation errors may occur despite best attempts at proofreading.   Carrie Mew, MD 06/20/19 [phone]

## 2019-06-20 NOTE — ED Notes (Signed)
This Rn has place a bandage over laceration. Pt A/Ox4. This RN has offered wheelchair, pt refuses and denies feeling dizzy a this time and he is able "to walk to the lobby".

## 2019-06-20 NOTE — Discharge Instructions (Signed)
Your CT scans of the head and neck today were okay.  Please rest the next few days, avoid potentially dangerous activities such as driving climbing on ladders or taking a bath, and follow-up with your primary care doctor this week.

## 2019-06-20 NOTE — ED Triage Notes (Signed)
Pt from home via [facility]. Per EMS, pt st getting out of his car walking on driveway and trip over a "crack on driveway" and hit his forehead. Pt denies POC, pr able to recall fall events; pt st he is on blood thinners and would like to be seen. 2 inch laceration to forehead. Pt presents to ED with bandage wrapped around his head applied by fire department on scene.VS by EMS ; 130/90; 95%RA; 65bpm. Hx of Afib, HTN.

## 2019-07-07 ENCOUNTER — Other Ambulatory Visit: Payer: Self-pay

## 2019-07-07 ENCOUNTER — Encounter: Payer: Self-pay | Admitting: Cardiovascular Disease

## 2019-07-07 ENCOUNTER — Ambulatory Visit (INDEPENDENT_AMBULATORY_CARE_PROVIDER_SITE_OTHER): Payer: Medicare Other | Admitting: Cardiovascular Disease

## 2019-07-07 VITALS — BP 142/64 | HR 60 | Temp 98.1°F | Ht 69.0 in | Wt 263.8 lb

## 2019-07-07 DIAGNOSIS — I2510 Atherosclerotic heart disease of native coronary artery without angina pectoris: Secondary | ICD-10-CM | POA: Diagnosis not present

## 2019-07-07 DIAGNOSIS — I480 Paroxysmal atrial fibrillation: Secondary | ICD-10-CM

## 2019-07-07 NOTE — Progress Notes (Signed)
Cardiology Office Note   Date:  07/07/2019   ID:  Jeff Henderson, DOB [birth year], MRN [id number]  PCP:  [name], MD  Cardiologist:   [name], MD   Chief Complaint  Patient presents with  . other    4 week follow up; CHF/A-Fib. Meds reviewed by the pt. verbally. Pt. c/o LE edema. The patient also tripped over his flip flop and hit his head; he was checked out at the ER on 06/20/2019.       History of Present Illness: Jeff Henderson is a 78 y.o. male who presents for a follow-up visit regarding coronary artery disease, chronic systolic heart failure and paroxysmal atrial fibrillation.    He has a history of coronary artery disease status post CABG (1995), left subclavian stenosis (reportedly up to 70% during cardiac catheterization at [facility] (2011), hypertension, hyperlipidemia, chronic kidney disease stage III, gout, and BPH.  He was admitted to [facility] in late 05/2018 with non-ST elevation myocardial infarction.  Catheterization by Dr. Saralyn Pilar (angiograms personally reviewed), showed severe three-vessel coronary artery disease including chronic total occlusions of the proximal RCA and mid LAD.  Distal LCx and grafted OM also appear chronically occluded.  There was a 99% ostial LAD stenosis, supplying 2 small diagonal branches.  LIMA to LAD was patent as well as SVG to RCA.  There was suggestion of stenosis in the proximal left subclavian artery, though it was not well visualized.   LVEF was mildly reduced at 45 to 50% on echo during that admission.    He is known to have left subclavian artery stenosis that there was evaluated by an angiogram at [facility] in 2011.  It showed that the stenosis was 40% at that time and a tortuous segment.  No intervention was performed.  He denies left arm claudication.  The patient has chronic kidney disease with difficult to control blood pressure.   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  Carotid Doppler in March 2020 showed moderate bilateral carotid disease  with no evidence of significant stenosis affecting the left subclavian artery.  Renal artery duplex showed no renal artery stenosis.  He was hospitalized in June 2020 with chest pain in the setting of newly diagnosed atrial fibrillation with RVR.  He converted to sinus rhythm on his own.  Troponin peaked at 50.  He underwent repeat cardiac catheterization which showed no significant change in anatomy.  The patient was placed on anticoagulation with Eliquis.  He had an echocardiogram done which showed an EF of 30 to 35%.  He has been doing reasonably well with no chest pain.  He describes stable exertional dyspnea and mild leg edema.  He has been taking his medications regularly.  He did have a fall last month and went to the emergency room due to scalp hematoma.   Past Medical History:  Diagnosis Date  . Arthritis   . CAD (coronary artery disease)    a. 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 06/2018 Cath: Native 3VD, 2/3 patent grafts (VG->OM2 100)->Med rx; c. 05/2019 NSTEMI/Cath:  LM nl, LAD 99ost, 144m LCX 1061mRCA 100p, LIMA->LAD ok, VG->RPDA ok (? jump to OM), VG->OM not visualized (prev occluded). LVEDP 3025m-->Med Rx.  . CKD (chronic kidney disease), stage III ([place]CC[place] . Gout   . Hernia, abdominal   . HFrEF (heart failure with reduced ejection fraction) ([place]CC[place]  a. 06/2018 Echo: EF 45-50%, mild MR; b. 05/2019 Echo: EF 30-35%, diff HK.  .Marland Kitchen  Hyperlipidemia LDL goal <70   . Hypertension   . Ischemic cardiomyopathy    a. 06/2018 Echo: EF 45-50%; b. 05/2019 Echo: EF 30-35%, diff HK.  . Myocardial infarction ([place])   . PAF (paroxysmal atrial fibrillation) ([place])    a. Dx 05/2019-->CHA2DS2VASc = 5-->Eliquis.    Past Surgical History:  Procedure Laterality Date  . carpel tunnel    . LEFT HEART CATH AND CORONARY ANGIOGRAPHY N/A 06/03/2018   Procedure: LEFT HEART CATH AND CORS/GRAFTS ANGIOGRAPHY;  Surgeon: Isaias Cowman, MD;  Location: [place] [facility];  Service:  Cardiovascular;  Laterality: N/A;  . LEFT HEART CATH AND CORS/GRAFTS ANGIOGRAPHY Bilateral 05/07/2019   Procedure: LEFT HEART CATH AND CORS/GRAFTS ANGIOGRAPHY;  Surgeon: Minna Merritts, MD;  Location: [place] [facility];  Service: Cardiovascular;  Laterality: Bilateral;  . REPLACEMENT TOTAL KNEE    . VEIN BYPASS SURGERY       Current Outpatient Medications  Medication Sig Dispense Refill  . amLODipine-benazepril (LOTREL) 10-40 MG capsule Take 1 capsule by mouth daily.   0  . apixaban (ELIQUIS) 5 MG TABS tablet Take 1 tablet (5 mg total) by mouth 2 (two) times daily. 60 tablet 5  . atorvastatin (LIPITOR) 80 MG tablet Take 80 mg by mouth daily.     . carvedilol (COREG) 25 MG tablet Take 1 tablet (25 mg total) by mouth 2 (two) times daily with a meal. 180 tablet 3  . clopidogrel (PLAVIX) 75 MG tablet Take 75 mg by mouth daily.     . Coenzyme Q10 (COQ10) 100 MG CAPS Take 100 mg by mouth daily.    . fluticasone (FLONASE) 50 MCG/ACT nasal spray Place 1 spray into both nostrils 2 (two) times a day.    . furosemide (LASIX) 40 MG tablet Take 0.5 tablets (20 mg total) by mouth daily. 30 tablet 5  . isosorbide mononitrate (IMDUR) 60 MG 24 hr tablet Take 60 mg by mouth daily.    . Multiple Vitamins-Minerals (MENS 50+ MULTI VITAMIN/MIN) TABS Take 1 tablet by mouth daily.     . nitroGLYCERIN (NITROSTAT) 0.4 MG SL tablet Place 1 tablet (0.4 mg total) under the tongue every 5 (five) minutes as needed for chest pain (Nitro if taking third dose, call 911). 25 tablet 3  . Omega-3 1000 MG CAPS Take 1,000 mg by mouth.     No current facility-administered medications for this visit.     Allergies:   Penicillins    Social History:  The patient  reports that he has never smoked. He has never used smokeless tobacco. He reports that he does not drink alcohol or use drugs.   Family History:  The patient's family history includes Asthma in his mother; Diabetes in his father; Hypertension in his father.     ROS:  Please see the history of present illness.   Otherwise, review of systems are positive for none.   All other systems are reviewed and negative.    PHYSICAL EXAM: VS:  BP (!) 142/64 (BP Location: Left Arm, Patient Position: Sitting, Cuff Size: Normal)   Pulse 60   Temp 98.1 F (36.7 C)   Ht 5' 9"  (1.753 m)   Wt 263 lb 12 oz (119.6 kg)   BMI 38.95 kg/m  , BMI Body mass index is 38.95 kg/m. GEN: Well nourished, well developed, in no acute distress  HEENT: normal  Neck: no JVD, carotid bruits, or masses Cardiac: RRR; no murmurs, rubs, or gallops,no edema  Respiratory:  clear to auscultation  bilaterally, normal work of breathing GI: soft, nontender, nondistended, + BS MS: no deformity or atrophy  Skin: warm and dry, no rash Neuro:  Strength and sensation are intact Psych: euthymic mood, full affect   EKG:  EKG is ordered today. The ekg ordered today demonstrates normal sinus rhythm with old inferior infarct.   Recent Labs: 06/02/2019: ALT 21; Hemoglobin 11.2; Platelets 132; TSH 2.691 06/18/2019: BUN 36; Creatinine, Ser 1.76; Potassium 4.1; Sodium 141    Lipid Panel No results found for: CHOL, TRIG, HDL, CHOLHDL, VLDL, LDLCALC, LDLDIRECT    Wt Readings from Last 3 Encounters:  07/07/19 263 lb 12 oz (119.6 kg)  06/20/19 259 lb (117.5 kg)  06/03/19 265 lb (120.2 kg)        PAD Screen 12/12/2018  Previous PAD dx? No  Previous surgical procedure? No  Pain with walking? No  Feet/toe relief with dangling? No  Painful, non-healing ulcers? No  Extremities discolored? No      ASSESSMENT AND PLAN:  1.  Coronary artery disease involving native coronary arteries with stable angina.  His symptoms are controlled on current antianginal medications.  Given the fact that he is on long-term anticoagulation with Eliquis and no recent cardiac stenting, I elected to discontinue Plavix to minimize the risk of bleeding.    2.  Moderate bilateral carotid disease: Repeat study in 1  year.    3.  Essential hypertension: He reports good readings at home.  Renal artery duplex showed no renal artery stenosis.  4.  Hyperlipidemia: Continue treatment with atorvastatin with a target LDL of less than 70.  5.  Chronic systolic heart failure: Most recent ejection fraction was 30 to 35%.  Continue current dose of furosemide 20 mg daily.  He does have mild bilateral leg edema but I elected not to change the dose.  He does have underlying chronic kidney disease with stable creatinine.  Continue carvedilol and lisinopril.  If ejection fraction does not improve in the near future, we could consider switching him to [facility].  6.  Paroxysmal atrial fibrillation: Currently maintaining in sinus rhythm.  He is tolerating anticoagulation with Eliquis.    Disposition:   FU with me in 4 months  Signed,  [name], MD  07/07/2019 3:44 PM    [place]

## 2019-07-07 NOTE — Patient Instructions (Signed)
Medication Instructions:  Your physician has recommended you make the following change in your medication:   STOP Plavix   If you need a refill on your cardiac medications before your next appointment, please call your pharmacy.   Lab work: None ordered If you have labs (blood work) drawn today and your tests are completely normal, you will receive your results only by: Marland Kitchen MyChart Message (if you have MyChart) OR . A paper copy in the mail If you have any lab test that is abnormal or we need to change your treatment, we will call you to review the results.  Testing/Procedures: None ordered  Follow-Up: At [facility], you and your health needs are our priority.  As part of our continuing mission to provide you with exceptional heart care, we have created designated Provider Care Teams.  These Care Teams include your primary Cardiologist (physician) and Advanced Practice Providers (APPs -  Physician Assistants and Nurse Practitioners) who all work together to provide you with the care you need, when you need it. You will need a follow up appointment in 4 months.  Please call our office 2 months in advance to schedule this appointment.  You may see [name], MD or one of the following Advanced Practice Providers on your designated Care Team:   Murray Hodgkins, NP Christell Faith, PA-C . Marrianne Mood, PA-C

## 2019-07-21 ENCOUNTER — Other Ambulatory Visit: Payer: Self-pay

## 2019-07-21 ENCOUNTER — Encounter: Payer: Medicare Other | Attending: Cardiovascular Disease | Admitting: *Deleted

## 2019-07-21 DIAGNOSIS — I214 Non-ST elevation (NSTEMI) myocardial infarction: Secondary | ICD-10-CM

## 2019-07-21 NOTE — Progress Notes (Signed)
Cardiac Individual Treatment Plan  Patient Details  Name: Jeff Henderson MRN: [id number] Date of Birth: [birth year] Referring Provider:     Cardiac Rehab from 05/25/2019 in [facility] Cardiac and Pulmonary Rehab  Referring Provider  [name] MD      Initial Encounter Date:    Cardiac Rehab from 05/25/2019 in [facility] Cardiac and Pulmonary Rehab  Date  05/25/19      Visit Diagnosis: NSTEMI (non-ST elevation myocardial infarction) [facility])  Patient's Home Medications on Admission:  Current Outpatient Medications:  .  amLODipine-benazepril (LOTREL) 10-40 MG capsule, Take 1 capsule by mouth daily. , Disp: , Rfl: 0 .  apixaban (ELIQUIS) 5 MG TABS tablet, Take 1 tablet (5 mg total) by mouth 2 (two) times daily., Disp: 60 tablet, Rfl: 5 .  atorvastatin (LIPITOR) 80 MG tablet, Take 80 mg by mouth daily. , Disp: , Rfl:  .  carvedilol (COREG) 25 MG tablet, Take 1 tablet (25 mg total) by mouth 2 (two) times daily with a meal., Disp: 180 tablet, Rfl: 3 .  Coenzyme Q10 (COQ10) 100 MG CAPS, Take 100 mg by mouth daily., Disp: , Rfl:  .  fluticasone (FLONASE) 50 MCG/ACT nasal spray, Place 1 spray into both nostrils 2 (two) times a day., Disp: , Rfl:  .  furosemide (LASIX) 40 MG tablet, Take 0.5 tablets (20 mg total) by mouth daily., Disp: 30 tablet, Rfl: 5 .  isosorbide mononitrate (IMDUR) 60 MG 24 hr tablet, Take 60 mg by mouth daily., Disp: , Rfl:  .  Multiple Vitamins-Minerals (MENS 50+ MULTI VITAMIN/MIN) TABS, Take 1 tablet by mouth daily. , Disp: , Rfl:  .  nitroGLYCERIN (NITROSTAT) 0.4 MG SL tablet, Place 1 tablet (0.4 mg total) under the tongue every 5 (five) minutes as needed for chest pain (Nitro if taking third dose, call 911)., Disp: 25 tablet, Rfl: 3 .  Omega-3 1000 MG CAPS, Take 1,000 mg by mouth., Disp: , Rfl:   Past Medical History: Past Medical History:  Diagnosis Date  . Arthritis   . CAD (coronary artery disease)    a. 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 06/2018 Cath:  Native 3VD, 2/3 patent grafts (VG->OM2 100)->Med rx; c. 05/2019 NSTEMI/Cath:  LM nl, LAD 99ost, 165m LCX 10110mRCA 100p, LIMA->LAD ok, VG->RPDA ok (? jump to OM), VG->OM not visualized (prev occluded). LVEDP 3038m-->Med Rx.  . CKD (chronic kidney disease), stage III ([place] . Gout   . Hernia, abdominal   . HFrEF (heart failure with reduced ejection fraction) ([place]  a. 06/2018 Echo: EF 45-50%, mild MR; b. 05/2019 Echo: EF 30-35%, diff HK.  . HMarland Kitchenperlipidemia LDL goal <70   . Hypertension   . Ischemic cardiomyopathy    a. 06/2018 Echo: EF 45-50%; b. 05/2019 Echo: EF 30-35%, diff HK.  . [place]ocardial infarction ([place] . PAF (paroxysmal atrial fibrillation) ([place]  a. Dx 05/2019-->CHA2DS2VASc = 5-->Eliquis.    Tobacco Use: Social History   Tobacco Use  Smoking Status Never Smoker  Smokeless Tobacco Never Used    Labs: Recent Review Flowsheet Data    Labs for ITP Cardiac and Pulmonary Rehab Latest Ref Rng & Units 05/07/2019   Hemoglobin A1c 4.8 - 5.6 % 5.7(H)       Exercise Target Goals: Exercise Program Goal: Individual exercise prescription set using results from initial 6 min walk test and THRR while considering  patient's activity barriers and safety.   Exercise Prescription Goal: Initial exercise prescription builds to  30-45 minutes a day of aerobic activity, 2-3 days per week.  Home exercise guidelines will be given to patient during program as part of exercise prescription that the participant will acknowledge.  Activity Barriers & Risk Stratification: Activity Barriers & Cardiac Risk Stratification - 05/25/19 1452      Activity Barriers & Cardiac Risk Stratification   Activity Barriers  Arthritis;Balance Concerns;Deconditioning;Back Problems;Muscular Weakness;Neck/Spine Problems    Cardiac Risk Stratification  High       6 Minute Walk:   Oxygen Initial Assessment:   Oxygen Re-Evaluation:   Oxygen Discharge (Final Oxygen Re-Evaluation):   Initial Exercise  Prescription: Initial Exercise Prescription - 05/25/19 1400      Date of Initial Exercise RX and Referring Provider   Date  05/25/19    Referring Provider  [name] MD      Track   Minutes  15      Prescription Details   Frequency (times per week)  3-5    Duration  Progress to 30 minutes of continuous aerobic without signs/symptoms of physical distress      Intensity   THRR 40-80% of Max Heartrate  90-124    Ratings of Perceived Exertion  11-13    Perceived Dyspnea  0-4      Progression   Progression  Continue to progress workloads to maintain intensity without signs/symptoms of physical distress.      Resistance Training   Training Prescription  Yes    Weight  ROM/Body Weight    Reps  10-15       Perform Capillary Blood Glucose checks as needed.  Exercise Prescription Changes:   Exercise Comments:   Exercise Goals and Review: Exercise Goals    Row Name 05/25/19 1453             Exercise Goals   Increase Physical Activity  Yes       Intervention  Provide advice, education, support and counseling about physical activity/exercise needs.;Develop an individualized exercise prescription for aerobic and resistive training based on initial evaluation findings, risk stratification, comorbidities and participant's personal goals.       Expected Outcomes  Short Term: Attend rehab on a regular basis to increase amount of physical activity.;Long Term: Add in home exercise to make exercise part of routine and to increase amount of physical activity.;Long Term: Exercising regularly at least 3-5 days a week.       Increase Strength and Stamina  Yes       Intervention  Provide advice, education, support and counseling about physical activity/exercise needs.;Develop an individualized exercise prescription for aerobic and resistive training based on initial evaluation findings, risk stratification, comorbidities and participant's personal goals.       Expected Outcomes  Short  Term: Increase workloads from initial exercise prescription for resistance, speed, and METs.;Short Term: Perform resistance training exercises routinely during rehab and add in resistance training at home;Long Term: Improve cardiorespiratory fitness, muscular endurance and strength as measured by increased METs and functional capacity (6MWT)       Able to understand and use rate of perceived exertion (RPE) scale  Yes       Intervention  Provide education and explanation on how to use RPE scale       Expected Outcomes  Short Term: Able to use RPE daily in rehab to express subjective intensity level;Long Term:  Able to use RPE to guide intensity level when exercising independently       Able to understand and use Dyspnea scale  Yes       Intervention  Provide education and explanation on how to use Dyspnea scale       Expected Outcomes  Short Term: Able to use Dyspnea scale daily in rehab to express subjective sense of shortness of breath during exertion;Long Term: Able to use Dyspnea scale to guide intensity level when exercising independently       Knowledge and understanding of Target Heart Rate Range (THRR)  Yes       Intervention  Provide education and explanation of THRR including how the numbers were predicted and where they are located for reference       Expected Outcomes  Short Term: Able to state/look up THRR;Long Term: Able to use THRR to govern intensity when exercising independently;Short Term: Able to use daily as guideline for intensity in rehab       Able to check pulse independently  Yes       Intervention  Provide education and demonstration on how to check pulse in carotid and radial arteries.;Review the importance of being able to check your own pulse for safety during independent exercise       Expected Outcomes  Short Term: Able to explain why pulse checking is important during independent exercise;Long Term: Able to check pulse independently and accurately       Understanding of  Exercise Prescription  Yes       Intervention  Provide education, explanation, and written materials on patient's individual exercise prescription       Expected Outcomes  Short Term: Able to explain program exercise prescription;Long Term: Able to explain home exercise prescription to exercise independently          Exercise Goals Re-Evaluation :   Discharge Exercise Prescription (Final Exercise Prescription Changes):   Nutrition:  Target Goals: Understanding of nutrition guidelines, daily intake of sodium <156m, cholesterol <2047m calories 30% from fat and 7% or less from saturated fats, daily to have 5 or more servings of fruits and vegetables.  Biometrics:    Nutrition Therapy Plan and Nutrition Goals: Nutrition Therapy & Goals - 05/18/19 1549      Nutrition Therapy   Diet  low sodium HH    Protein (specify units)  90g    Fiber  25 grams    Whole Grain Foods  3 servings    Saturated Fats  12 max. grams    Fruits and Vegetables  5 servings/day    Sodium  1.5 grams      Personal Nutrition Goals   Nutrition Goal  ST: navigate hh eating and digest all the information we discussed LT: lose weight 20lb    Comments  always careful about what eat and drink. Drinks green tea, water, milk. Nutrition bars and shakes. B: special K with bananas or other mixed fruit and honey(1 tsp) and 2% milk. L: small amounts, nutrition bar and ranch salads. D: fish sometimes but will have chefs salads. discussed Myplate, HH eating, some swaps he could incorporate (yogurt and ranch seasoning for traditional ranch) and label reading. Talked about realistic weight loss and expectations. Pt reports that he has fluid built up that he would like to lose, talked about sodium.      Intervention Plan   Intervention  Prescribe, educate and counsel regarding individualized specific dietary modifications aiming towards targeted core components such as weight, hypertension, lipid management, diabetes, heart  failure and other comorbidities.    Expected Outcomes  Short Term Goal: A plan has been developed with  personal nutrition goals set during dietitian appointment.;Short Term Goal: Understand basic principles of dietary content, such as calories, fat, sodium, cholesterol and nutrients.;Long Term Goal: Adherence to prescribed nutrition plan.       Nutrition Assessments:   Nutrition Goals Re-Evaluation: Nutrition Goals Re-Evaluation    [place] Name 06/08/19 1534             Goals   Nutrition Goal  ST: navigate hh eating and digest all the information we discussed LT: lose weight 20lb       Comment  Pt waiting to recieve nutrition info still.       Expected Outcome  Pt will review nutrition info sent and then make ST goals          Nutrition Goals Discharge (Final Nutrition Goals Re-Evaluation): Nutrition Goals Re-Evaluation - 06/08/19 1534      Goals   Nutrition Goal  ST: navigate hh eating and digest all the information we discussed LT: lose weight 20lb    Comment  Pt waiting to recieve nutrition info still.    Expected Outcome  Pt will review nutrition info sent and then make ST goals       Psychosocial: Target Goals: Acknowledge presence or absence of significant depression and/or stress, maximize coping skills, provide positive support system. Participant is able to verbalize types and ability to use techniques and skills needed for reducing stress and depression.   Initial Review & Psychosocial Screening:   Quality of Life Scores:   Scores of 19 and below usually indicate a poorer quality of life in these areas.  A difference of  2-3 points is a clinically meaningful difference.  A difference of 2-3 points in the total score of the Quality of Life Index has been associated with significant improvement in overall quality of life, self-image, physical symptoms, and general health in studies assessing change in quality of life.  PHQ-9: Recent Review Flowsheet Data    There is  no flowsheet data to display.     Interpretation of Total Score  Total Score Depression Severity:  1-4 = Minimal depression, 5-9 = Mild depression, 10-14 = Moderate depression, 15-19 = Moderately severe depression, 20-27 = Severe depression   Psychosocial Evaluation and Intervention:   Psychosocial Re-Evaluation:   Psychosocial Discharge (Final Psychosocial Re-Evaluation):   Vocational Rehabilitation: Provide vocational rehab assistance to qualifying candidates.   Vocational Rehab Evaluation & Intervention:   Education: Education Goals: Education classes will be provided on a variety of topics geared toward better understanding of heart health and risk factor modification. Participant will state understanding/return demonstration of topics presented as noted by education test scores.  Learning Barriers/Preferences:   Education Topics:  AED/CPR: - Group verbal and written instruction with the use of models to demonstrate the basic use of the AED with the basic ABC's of resuscitation.   General Nutrition Guidelines/Fats and Fiber: -Group instruction provided by verbal, written material, models and posters to present the general guidelines for heart healthy nutrition. Gives an explanation and review of dietary fats and fiber.   Controlling Sodium/Reading Food Labels: -Group verbal and written material supporting the discussion of sodium use in heart healthy nutrition. Review and explanation with models, verbal and written materials for utilization of the food label.   Exercise Physiology & General Exercise Guidelines: - Group verbal and written instruction with models to review the exercise physiology of the cardiovascular system and associated critical values. Provides general exercise guidelines with specific guidelines to those with heart or lung  disease.    Aerobic Exercise & Resistance Training: - Gives group verbal and written instruction on the various components of  exercise. Focuses on aerobic and resistive training programs and the benefits of this training and how to safely progress through these programs..   Flexibility, Balance, Mind/Body Relaxation: Provides group verbal/written instruction on the benefits of flexibility and balance training, including mind/body exercise modes such as yoga, pilates and tai chi.  Demonstration and skill practice provided.   Stress and Anxiety: - Provides group verbal and written instruction about the health risks of elevated stress and causes of high stress.  Discuss the correlation between heart/lung disease and anxiety and treatment options. Review healthy ways to manage with stress and anxiety.   Depression: - Provides group verbal and written instruction on the correlation between heart/lung disease and depressed mood, treatment options, and the stigmas associated with seeking treatment.   Anatomy & Physiology of the Heart: - Group verbal and written instruction and models provide basic cardiac anatomy and physiology, with the coronary electrical and arterial systems. Review of Valvular disease and Heart Failure   Cardiac Procedures: - Group verbal and written instruction to review commonly prescribed medications for heart disease. Reviews the medication, class of the drug, and side effects. Includes the steps to properly store meds and maintain the prescription regimen. (beta blockers and nitrates)   Cardiac Medications I: - Group verbal and written instruction to review commonly prescribed medications for heart disease. Reviews the medication, class of the drug, and side effects. Includes the steps to properly store meds and maintain the prescription regimen.   Cardiac Medications II: -Group verbal and written instruction to review commonly prescribed medications for heart disease. Reviews the medication, class of the drug, and side effects. (all other drug classes)    Go Sex-Intimacy & Heart Disease,  Get SMART - Goal Setting: - Group verbal and written instruction through game format to discuss heart disease and the return to sexual intimacy. Provides group verbal and written material to discuss and apply goal setting through the application of the S.M.A.R.T. Method.   Other Matters of the Heart: - Provides group verbal, written materials and models to describe Stable Angina and Peripheral Artery. Includes description of the disease process and treatment options available to the cardiac patient.   Exercise & Equipment Safety: - Individual verbal instruction and demonstration of equipment use and safety with use of the equipment.   Infection Prevention: - Provides verbal and written material to individual with discussion of infection control including proper hand washing and proper equipment cleaning during exercise session.   Falls Prevention: - Provides verbal and written material to individual with discussion of falls prevention and safety.   Diabetes: - Individual verbal and written instruction to review signs/symptoms of diabetes, desired ranges of glucose level fasting, after meals and with exercise. Acknowledge that pre and post exercise glucose checks will be done for 3 sessions at entry of program.   Know Your Numbers and Risk Factors: -Group verbal and written instruction about important numbers in your health.  Discussion of what are risk factors and how they play a role in the disease process.  Review of Cholesterol, Blood Pressure, Diabetes, and BMI and the role they play in your overall health.   Sleep Hygiene: -Provides group verbal and written instruction about how sleep can affect your health.  Define sleep hygiene, discuss sleep cycles and impact of sleep habits. Review good sleep hygiene tips.    Other: -Provides group and  verbal instruction on various topics (see comments)   Knowledge Questionnaire Score:   Core Components/Risk Factors/Patient Goals at  Admission: Personal Goals and Risk Factors at Admission - 05/25/19 1454      Core Components/Risk Factors/Patient Goals on Admission    Weight Management  Yes;Weight Loss    Intervention  Weight Management: Develop a combined nutrition and exercise program designed to reach desired caloric intake, while maintaining appropriate intake of nutrient and fiber, sodium and fats, and appropriate energy expenditure required for the weight goal.;Weight Management: Provide education and appropriate resources to help participant work on and attain dietary goals.    Expected Outcomes  Short Term: Continue to assess and modify interventions until short term weight is achieved;Long Term: Adherence to nutrition and physical activity/exercise program aimed toward attainment of established weight goal;Weight Loss: Understanding of general recommendations for a balanced deficit meal plan, which promotes 1-2 lb weight loss per week and includes a negative energy balance of [phone] kcal/d;Understanding recommendations for meals to include 15-35% energy as protein, 25-35% energy from fat, 35-60% energy from carbohydrates, less than 271m of dietary cholesterol, 20-35 gm of total fiber daily;Understanding of distribution of calorie intake throughout the day with the consumption of 4-5 meals/snacks    Hypertension  Yes    Intervention  Monitor prescription use compliance.;Provide education on lifestyle modifcations including regular physical activity/exercise, weight management, moderate sodium restriction and increased consumption of fresh fruit, vegetables, and low fat dairy, alcohol moderation, and smoking cessation.    Expected Outcomes  Short Term: Continued assessment and intervention until BP is < 140/961mHG in hypertensive participants. < 130/8039mG in hypertensive participants with diabetes, heart failure or chronic kidney disease.;Long Term: Maintenance of blood pressure at goal levels.       Core Components/Risk  Factors/Patient Goals Review:    Core Components/Risk Factors/Patient Goals at Discharge (Final Review):    ITP Comments: ITP Comments    Row Name 05/15/19 1103 05/25/19 1414 05/25/19 1415 07/21/19 1329     ITP Comments  Initial visit with [facility] completed. Consent and intake done. Appts for EP and RD created. Note to Mental Health counselor sent  regarding Depression symptoms. JR did see his primary physican this week and talked about is depression at that appoinment, he does not want pills.  Completed initial ExRx created and sent to Dr. MarEmily Filbertedical Director to review and sign.  JR noted that his feet were swollen today.  They have been bothering him for a few days.  He was encouraged to contact his doctor about possibly taking an extra dose of Lasix to help reduce his fluid.  Called to check in with JR.  His gym is planning to reopen next week and he would prefer to go there versus driving all the way up to the hospital.  He would like to discharge from the program at this time.       Comments: Discharge ITP--Pt going to gym closer to home.

## 2019-07-22 ENCOUNTER — Telehealth: Payer: Self-pay | Admitting: Cardiovascular Disease

## 2019-07-22 NOTE — Telephone Encounter (Signed)
Returned call to patient with advice from Dr. Fletcher Anon. No answer. Per DPR, okay to leave detailed message.   No new orders, no med change advised.   Advised pt to call for any further questions or concerns.

## 2019-07-22 NOTE — Telephone Encounter (Signed)
His LDL was not low at 65.  Should continue same dose of atorvastatin as this medication is doing what it is supposed to do. His HDL is low but that has nothing to do with treatment with atorvastatin.  The only way to effectively increase HDL is by regular exercise.

## 2019-07-22 NOTE — Telephone Encounter (Signed)
Review of mychart, Labs taken 8/18 by [facility]:  Total Cholesterol 106 LDL 65 HDL 26.  PCP asked him to call Dr. Fletcher Anon to further advise on low HDL to discuss alternative to atorvastatin.   Routing to Dr. Fletcher Anon to further advise.

## 2019-07-22 NOTE — Telephone Encounter (Signed)
Patient calling  States PCP contacted him with lab results  His cholesterol was exceeding low, was told to contact Dr Fletcher Anon May need to alter atorvastatin medication Please call to discuss

## 2019-08-31 ENCOUNTER — Other Ambulatory Visit: Payer: Self-pay | Admitting: *Deleted

## 2019-09-01 MED ORDER — ISOSORBIDE MONONITRATE ER 60 MG PO TB24: 60.0000 mg | ORAL_TABLET | Freq: Every day | ORAL | 1 refills | Status: DC

## 2019-11-06 ENCOUNTER — Ambulatory Visit: Payer: Medicare Other | Admitting: Cardiovascular Disease

## 2019-11-17 ENCOUNTER — Telehealth: Payer: Self-pay | Admitting: Cardiovascular Disease

## 2019-11-17 NOTE — Telephone Encounter (Signed)
STAT if HR is under 50 or over 120 (normal HR is 60-100 beats per minute)  1) What is your heart rate? 56   2) Do you have a log of your heart rate readings (document readings)?  Patient says hr normally runs 50-60's    3) Do you have any other symptoms? No    bp 126/65    Patient just wants to report his vitals due to recent appt change

## 2019-11-17 NOTE — Telephone Encounter (Signed)
Returned the patients call. DPR on file. lmom the patient's HR reading he provided is ok. Patient should continue to monitor VS. Patient can contact the office if any questions.

## 2019-11-18 ENCOUNTER — Ambulatory Visit: Payer: Medicare Other | Admitting: Nurse Practitioner

## 2019-11-25 ENCOUNTER — Telehealth: Payer: Medicare Other | Admitting: Cardiovascular Disease

## 2019-11-25 ENCOUNTER — Encounter: Payer: Self-pay | Admitting: Cardiovascular Disease

## 2019-11-25 ENCOUNTER — Telehealth (INDEPENDENT_AMBULATORY_CARE_PROVIDER_SITE_OTHER): Payer: Medicare Other | Admitting: Cardiovascular Disease

## 2019-11-25 ENCOUNTER — Other Ambulatory Visit: Payer: Self-pay

## 2019-11-25 VITALS — BP 128/65 | HR 56 | Ht 69.0 in | Wt 252.0 lb

## 2019-11-25 DIAGNOSIS — I110 Hypertensive heart disease with heart failure: Secondary | ICD-10-CM | POA: Diagnosis not present

## 2019-11-25 DIAGNOSIS — I480 Paroxysmal atrial fibrillation: Secondary | ICD-10-CM

## 2019-11-25 DIAGNOSIS — I779 Disorder of arteries and arterioles, unspecified: Secondary | ICD-10-CM

## 2019-11-25 DIAGNOSIS — E785 Hyperlipidemia, unspecified: Secondary | ICD-10-CM

## 2019-11-25 DIAGNOSIS — I10 Essential (primary) hypertension: Secondary | ICD-10-CM

## 2019-11-25 DIAGNOSIS — I5022 Chronic systolic (congestive) heart failure: Secondary | ICD-10-CM | POA: Diagnosis not present

## 2019-11-25 DIAGNOSIS — I2510 Atherosclerotic heart disease of native coronary artery without angina pectoris: Secondary | ICD-10-CM

## 2019-11-25 MED ORDER — APIXABAN 2.5 MG PO TABS: 2.5000 mg | ORAL_TABLET | Freq: Two times a day (BID) | ORAL | 6 refills | Status: DC

## 2019-11-25 NOTE — Patient Instructions (Signed)
Medication Instructions:  - Your physician has recommended you make the following change in your medication:   1) Decrease Eliquis to 2.5 mg- take 1 tablet by mouth twice daily  *If you need a refill on your cardiac medications before your next appointment, please call your pharmacy*  Lab Work: - none ordered  If you have labs (blood work) drawn today and your tests are completely normal, you will receive your results only by: Marland Kitchen MyChart Message (if you have MyChart) OR . A paper copy in the mail If you have any lab test that is abnormal or we need to change your treatment, we will call you to review the results.  Testing/Procedures: - Your physician has requested that you have an echocardiogram. Echocardiography is a painless test that uses sound waves to create images of your heart. It provides your doctor with information about the size and shape of your heart and how well your heart's chambers and valves are working. This procedure takes approximately one hour. There are no restrictions for this procedure. Occasionally an IV may need to be started during your test to inject an image enhancer (this is not a contrast/ dye). The technician will determine at the beginning of the test if an IV is needed. Please make sure to drink some fluids prior to coming in.   Follow-Up: At [facility], you and your health needs are our priority.  As part of our continuing mission to provide you with exceptional heart care, we have created designated Provider Care Teams.  These Care Teams include your primary Cardiologist (physician) and Advanced Practice Providers (APPs -  Physician Assistants and Nurse Practitioners) who all work together to provide you with the care you need, when you need it.  Your next appointment:   4 month(s)  The format for your next appointment:   In Person  Provider:    You may see [name], MD or one of the following Advanced Practice Providers on your designated  Care Team:    Murray Hodgkins, NP  Christell Faith, PA-C  Marrianne Mood, PA-C   Other Instructions N/a   Echocardiogram An echocardiogram is a procedure that uses painless sound waves (ultrasound) to produce an image of the heart. Images from an echocardiogram can provide important information about:  Signs of coronary artery disease (CAD).  Aneurysm detection. An aneurysm is a weak or damaged part of an artery wall that bulges out from the normal force of blood pumping through the body.  Heart size and shape. Changes in the size or shape of the heart can be associated with certain conditions, including heart failure, aneurysm, and CAD.  Heart muscle function.  Heart valve function.  Signs of a past heart attack.  Fluid buildup around the heart.  Thickening of the heart muscle.  A tumor or infectious growth around the heart valves. Tell a health care provider about:  Any allergies you have.  All medicines you are taking, including vitamins, herbs, eye drops, creams, and over-the-counter medicines.  Any blood disorders you have.  Any surgeries you have had.  Any medical conditions you have.  Whether you are pregnant or may be pregnant. What are the risks? Generally, this is a safe procedure. However, problems may occur, including:  Allergic reaction to dye (contrast) that may be used during the procedure. What happens before the procedure? No specific preparation is needed. You may eat and drink normally. What happens during the procedure?   An IV tube may be  inserted into one of your veins.  You may receive contrast through this tube. A contrast is an injection that improves the quality of the pictures from your heart.  A gel will be applied to your chest.  A wand-like tool (transducer) will be moved over your chest. The gel will help to transmit the sound waves from the transducer.  The sound waves will harmlessly bounce off of your heart to allow the  heart images to be captured in real-time motion. The images will be recorded on a computer. The procedure may vary among health care providers and hospitals. What happens after the procedure?  You may return to your normal, everyday life, including diet, activities, and medicines, unless your health care provider tells you not to do that. Summary  An echocardiogram is a procedure that uses painless sound waves (ultrasound) to produce an image of the heart.  Images from an echocardiogram can provide important information about the size and shape of your heart, heart muscle function, heart valve function, and fluid buildup around your heart.  You do not need to do anything to prepare before this procedure. You may eat and drink normally.  After the echocardiogram is completed, you may return to your normal, everyday life, unless your health care provider tells you not to do that. This information is not intended to replace advice given to you by your health care provider. Make sure you discuss any questions you have with your health care provider. Document Released: 11/16/2000 Document Revised: 03/12/2019 Document Reviewed: 12/22/2016 Elsevier Patient Education  2020 [employer].

## 2019-11-25 NOTE — Progress Notes (Signed)
Virtual Visit via Telephone Note   This visit type was conducted due to national recommendations for restrictions regarding the COVID-19 Pandemic (e.g. social distancing) in an effort to limit this patient's exposure and mitigate transmission in our community.  Due to his co-morbid illnesses, this patient is at least at moderate risk for complications without adequate follow up.  This format is felt to be most appropriate for this patient at this time.  The patient did not have access to video technology/had technical difficulties with video requiring transitioning to audio format only (telephone).  All issues noted in this document were discussed and addressed.  No physical exam could be performed with this format.  Please refer to the patient's chart for his  consent to telehealth for [facility].   Date:  11/25/2019   ID:  Jeff Henderson, DOB December 23, 1940, MRN [id number]  Patient Location: Home Provider Location: Office  PCP:  [name], MD  Cardiologist:  [name], MD  Electrophysiologist:  None   Evaluation Performed:  Follow-Up Visit  Chief Complaint: Occasional rectal bleeding  History of Present Illness:    Jeff Henderson is a 78 y.o. male who was reached via phone for follow-up visit regarding coronary artery disease, chronic systolic heart failure and paroxysmal atrial fibrillation.   He has a history of coronary artery disease status post CABG (1995), left subclavian stenosis (reportedly up to 70% during cardiac catheterization at [facility] (2011), hypertension, hyperlipidemia, chronic kidney disease stage III, gout, and BPH.  He was admitted to [facility] in late 05/2018 with non-ST elevation myocardial infarction. Catheterization by Dr. Saralyn Pilar (angiograms personally reviewed), showed severe three-vessel coronary artery disease including chronic total occlusions of the proximal RCA and mid LAD.  Distal LCx and grafted OM also appeared chronically occluded.  There was  a 99% ostial LAD stenosis, supplying 2 small diagonal branches.  LIMA to LAD was patent as well as SVG to RCA.  There was suggestion of stenosis in the proximal left subclavian artery, though it was not well visualized.   LVEF was mildly reduced at 45 to 50% on echo during that admission.    He is known to have left subclavian artery stenosis that there was evaluated by an angiogram at [facility] in 2011.  It showed that the stenosis was 40% at that time and a tortuous segment.  No intervention was performed.  He denies left arm claudication.  The patient has chronic kidney disease with difficult to control blood pressure.   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  Carotid Doppler in March 2020 showed moderate bilateral carotid disease with no evidence of significant stenosis affecting the left subclavian artery.  Renal artery duplex showed no renal artery stenosis.  He was hospitalized in June 2020 with chest pain in the setting of newly diagnosed atrial fibrillation with RVR.  He converted to sinus rhythm on his own.   He underwent repeat cardiac catheterization which showed no significant change in anatomy.  The patient was placed on anticoagulation with Eliquis.  He had an echocardiogram done which showed an EF of 30 to 35%.  He has been doing reasonably well with no recent chest pain, shortness of breath or palpitations.  He takes his medications regularly but reports that he has been taking Eliquis once daily instead of twice daily.  He has occasional rectal bleeding thought to be due to hemorrhoids and he has been evaluated by GI.  He is scheduled to have endoscopic procedures in February.    The  patient does not have symptoms concerning for COVID-19 infection (fever, chills, cough, or new shortness of breath).    Past Medical History:  Diagnosis Date  . Arthritis   . CAD (coronary artery disease)    a. 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 06/2018 Cath: Native 3VD, 2/3 patent grafts (VG->OM2  100)->Med rx; c. 05/2019 NSTEMI/Cath:  LM nl, LAD 99ost, 112m LCX 1028mRCA 100p, LIMA->LAD ok, VG->RPDA ok (? jump to OM), VG->OM not visualized (prev occluded). LVEDP 3055m-->Med Rx.  . CKD (chronic kidney disease), stage III   . Gout   . Hernia, abdominal   . HFrEF (heart failure with reduced ejection fraction) ([facility]  a. 06/2018 Echo: EF 45-50%, mild MR; b. 05/2019 Echo: EF 30-35%, diff HK.  . HMarland Kitchenperlipidemia LDL goal <70   . Hypertension   . Ischemic cardiomyopathy    a. 06/2018 Echo: EF 45-50%; b. 05/2019 Echo: EF 30-35%, diff HK.  . [place]ocardial infarction ([facility] . [place] ([place]oxysmal [place] fibrillation) ([facility]  a. Dx 05/2019-->CHA2DS2VASc = 5-->Eliquis.   Past Surgical History:  Procedure Laterality Date  . carpel tunnel    . LEFT HEART CATH AND CORONARY ANGIOGRAPHY N/A 06/03/2018   Procedure: LEFT HEART CATH AND CORS/GRAFTS ANGIOGRAPHY;  Surgeon: ParIsaias CowmanD;  [place] [facility];  Service: Cardiovascular;  Laterality: N/A;  . LEFT HEART CATH AND CORS/GRAFTS ANGIOGRAPHY Bilateral 05/07/2019   Procedure: LEFT HEART CATH AND CORS/GRAFTS ANGIOGRAPHY;  Surgeon: GolMinna MerrittsD;  [facility];  Service: Cardiovascular;  Laterality: Bilateral;  . REPLACEMENT TOTAL KNEE    . VEIN BYPASS SURGERY       Current Meds  Medication Sig  . amLODipine-benazepril (LOTREL) 10-40 MG capsule Take 1 capsule by mouth daily.   . aMarland Kitchenixaban (ELIQUIS) 5 MG TABS tablet Take 1 tablet (5 mg total) by mouth 2 (two) times daily. (Patient taking differently: Take 5 mg by mouth 2 (two) times daily. Taking once daily)  . atorvastatin (LIPITOR) 80 MG tablet Take 80 mg by mouth daily.   . carvedilol (COREG) 25 MG tablet Take 1 tablet (25 mg total) by mouth 2 (two) times daily with a meal.  . Coenzyme Q10 (COQ10) 100 MG CAPS Take 100 mg by mouth daily.  . furosemide (LASIX) 40 MG tablet Take 0.5 tablets (20 mg total) by mouth daily.  . isosorbide mononitrate (IMDUR) 60 MG 24  hr tablet Take 1 tablet (60 mg total) by mouth daily.  . Multiple Vitamins-Minerals (MENS 50+ MULTI VITAMIN/MIN) TABS Take 1 tablet by mouth daily.   . nitroGLYCERIN (NITROSTAT) 0.4 MG SL tablet Place 1 tablet (0.4 mg total) under the tongue every 5 (five) minutes as needed for chest pain (Nitro if taking third dose, call 911).  . Omega-3 1000 MG CAPS Take 1,000 mg by mouth daily.      Allergies:   Penicillins   Social History   Tobacco Use  . Smoking status: Never Smoker  . Smokeless tobacco: Never Used  Substance Use Topics  . Alcohol use: No    Alcohol/week: 0.0 standard drinks  . Drug use: No     Family Hx: The patient's family history includes Asthma in his mother; Diabetes in his father; Hypertension in his father.  ROS:   Please see the history of present illness.     All other systems reviewed and are negative.   Prior CV studies:   The following studies were reviewed today:  Labs/Other Tests and Data Reviewed:    EKG:  No ECG reviewed.  Recent Labs: 06/02/2019: ALT 21; Hemoglobin 11.2; Platelets 132; TSH 2.691 06/18/2019: BUN 36; Creatinine, Ser 1.76; Potassium 4.1; Sodium 141   Recent Lipid Panel No results found for: CHOL, TRIG, HDL, CHOLHDL, LDLCALC, LDLDIRECT  Wt Readings from Last 3 Encounters:  11/25/19 252 lb (114.3 kg)  07/07/19 263 lb 12 oz (119.6 kg)  06/20/19 259 lb (117.5 kg)     Objective:    Vital Signs:  BP 128/65 (BP Location: Left Arm)   Pulse (!) 56   Ht 5' 9"  (1.753 m)   Wt 252 lb (114.3 kg)   BMI 37.21 kg/m    VITAL SIGNS:  reviewed  ASSESSMENT & PLAN:    1.  Coronary artery disease involving native coronary arteries with stable angina.  His symptoms are controlled on current antianginal medications.  He is not on any antiplatelet medication given that he is on Eliquis.  2.  Moderate bilateral carotid disease: Repeat study in March 2021.  3.  Essential hypertension: He might have whitecoat syndrome as his blood pressure  seems to be better at home.    Renal artery duplex showed no renal artery stenosis.  Continue same medications.  4.  Hyperlipidemia: Continue treatment with atorvastatin with a target LDL of less than 70.  I reviewed most recent lipid profile done in August which showed an LDL of 65.  5.  Chronic systolic heart failure: Most recent ejection fraction was 30 to 35%.  Continue current dose of furosemide 20 mg daily.  Continue carvedilol and lisinopril.  I requested a repeat echocardiogram to evaluate ejection fraction now that he is maintaining sinus rhythm.  If EF is less than 40%, recommend switching lisinopril to Entresto.  6.  Paroxysmal atrial fibrillation: Currently maintaining in sinus rhythm.  He is tolerating anticoagulation with Eliquis.  He does report some occasional rectal bleeding thought to be due to hemorrhoids and he will be undergoing endoscopic evaluation in February.  His creatinine is consistently above 1.5 and he is going to turn 78 years old soon.  Thus, I elected to change Eliquis to 2.5 mg twice daily.   COVID-19 Education: The signs and symptoms of COVID-19 were discussed with the patient and how to seek care for testing (follow up with PCP or arrange E-visit).  The importance of social distancing was discussed today.  Time:   Today, I have spent 10 minutes with the patient with telehealth technology discussing the above problems.     Medication Adjustments/Labs and Tests Ordered: Current medicines are reviewed at length with the patient today.  Concerns regarding medicines are outlined above.   Tests Ordered: No orders of the defined types were placed in this encounter.   Medication Changes: No orders of the defined types were placed in this encounter.   Follow Up:  In Person in 4 month(s)  Signed, [name], MD  11/25/2019 2:38 PM    [place]

## 2019-11-26 ENCOUNTER — Other Ambulatory Visit: Payer: Self-pay

## 2019-11-26 DIAGNOSIS — R768 Other specified abnormal immunological findings in serum: Secondary | ICD-10-CM

## 2019-11-30 ENCOUNTER — Inpatient Hospital Stay: Payer: Medicare Other | Attending: Hematology and Oncology

## 2019-11-30 ENCOUNTER — Other Ambulatory Visit: Payer: Self-pay

## 2019-11-30 DIAGNOSIS — R768 Other specified abnormal immunological findings in serum: Secondary | ICD-10-CM | POA: Diagnosis present

## 2019-11-30 DIAGNOSIS — I129 Hypertensive chronic kidney disease with stage 1 through stage 4 chronic kidney disease, or unspecified chronic kidney disease: Secondary | ICD-10-CM | POA: Insufficient documentation

## 2019-11-30 DIAGNOSIS — I255 Ischemic cardiomyopathy: Secondary | ICD-10-CM | POA: Insufficient documentation

## 2019-11-30 DIAGNOSIS — Z7901 Long term (current) use of anticoagulants: Secondary | ICD-10-CM | POA: Diagnosis not present

## 2019-11-30 DIAGNOSIS — E785 Hyperlipidemia, unspecified: Secondary | ICD-10-CM | POA: Insufficient documentation

## 2019-11-30 DIAGNOSIS — D631 Anemia in chronic kidney disease: Secondary | ICD-10-CM | POA: Diagnosis not present

## 2019-11-30 DIAGNOSIS — I252 Old myocardial infarction: Secondary | ICD-10-CM | POA: Insufficient documentation

## 2019-11-30 DIAGNOSIS — Z79899 Other long term (current) drug therapy: Secondary | ICD-10-CM | POA: Insufficient documentation

## 2019-11-30 DIAGNOSIS — N183 Chronic kidney disease, stage 3 unspecified: Secondary | ICD-10-CM | POA: Diagnosis not present

## 2019-11-30 DIAGNOSIS — I2510 Atherosclerotic heart disease of native coronary artery without angina pectoris: Secondary | ICD-10-CM | POA: Insufficient documentation

## 2019-11-30 DIAGNOSIS — D649 Anemia, unspecified: Secondary | ICD-10-CM

## 2019-11-30 DIAGNOSIS — I480 Paroxysmal atrial fibrillation: Secondary | ICD-10-CM | POA: Diagnosis not present

## 2019-11-30 LAB — CBC WITH DIFFERENTIAL/PLATELET
Abs Immature Granulocytes: 0.02 10*3/uL (ref 0.00–0.07)
Basophils Absolute: 0 10*3/uL (ref 0.0–0.1)
Basophils Relative: 0 %
Eosinophils Absolute: 0.5 10*3/uL (ref 0.0–0.5)
Eosinophils Relative: 7 %
HCT: 37.1 % — ABNORMAL LOW (ref 39.0–52.0)
Hemoglobin: 11.9 g/dL — ABNORMAL LOW (ref 13.0–17.0)
Immature Granulocytes: 0 %
Lymphocytes Relative: 15 %
Lymphs Abs: 1.2 10*3/uL (ref 0.7–4.0)
MCH: 30.7 pg (ref 26.0–34.0)
MCHC: 32.1 g/dL (ref 30.0–36.0)
MCV: 95.9 fL (ref 80.0–100.0)
Monocytes Absolute: 0.7 10*3/uL (ref 0.1–1.0)
Monocytes Relative: 8 %
Neutro Abs: 5.3 10*3/uL (ref 1.7–7.7)
Neutrophils Relative %: 70 %
Platelets: 177 10*3/uL (ref 150–400)
RBC: 3.87 MIL/uL — ABNORMAL LOW (ref 4.22–5.81)
RDW: 13.1 % (ref 11.5–15.5)
WBC: 7.7 10*3/uL (ref 4.0–10.5)
nRBC: 0 % (ref 0.0–0.2)

## 2019-11-30 LAB — COMPREHENSIVE METABOLIC PANEL
ALT: 29 U/L (ref 0–44)
AST: 29 U/L (ref 15–41)
Albumin: 3.9 g/dL (ref 3.5–5.0)
Alkaline Phosphatase: 45 U/L (ref 38–126)
Anion gap: 9 (ref 5–15)
BUN: 40 mg/dL — ABNORMAL HIGH (ref 8–23)
CO2: 22 mmol/L (ref 22–32)
Calcium: 9.2 mg/dL (ref 8.9–10.3)
Chloride: 108 mmol/L (ref 98–111)
Creatinine, Ser: 1.95 mg/dL — ABNORMAL HIGH (ref 0.61–1.24)
GFR calc Af Amer: 37 mL/min — ABNORMAL LOW (ref >60)
GFR calc non Af Amer: 32 mL/min — ABNORMAL LOW (ref >60)
Glucose, Bld: 130 mg/dL — ABNORMAL HIGH (ref 70–99)
Potassium: 4.5 mmol/L (ref 3.5–5.1)
Sodium: 139 mmol/L (ref 135–145)
Total Bilirubin: 0.7 mg/dL (ref 0.3–1.2)
Total Protein: 7 g/dL (ref 6.5–8.1)

## 2019-11-30 NOTE — Progress Notes (Signed)
[facility]  [address] [place], [place] [address] Phone: [phone]  Fax: [phone]  Telephone Visit:  12/01/2019  Referring physician: [name], MD  I connected with Jeff Henderson on 12/01/2019 at 11:04 AM by telephone and verified that I was speaking with the correct person using 2 identifiers.  The patient was at home.  I discussed the limitations, risk, security and privacy concerns of performing an evaluation and management service by telephone and the availability of in person appointments.  I also discussed with the patient that there may be a patient responsible charge related to this service.  The patient expressed understanding and agreed to proceed.   Chief Complaint: Jeff Henderson is a 78 y.o. male with elevated free light chains due to CKD who is seen for 6 month assessment.  HPI: The patient was last seen in the hematology clinic on 06/02/2019 for initial assessment.  Symptomatically, he described left-sided back pain for 2 weeks.  He had lower extremity swelling.  Labs revealed a hematocrit 34.0, hemoglobin 11.2, platelets 132,000, WBC 7,000. Ferritin was 131 with an iron staturation of 19% and a TIBC of 290. BUN was 41 and creatinine 1.76.  Calcium was 8.8. Vitamin B12 was 589 and folate 9.6. TSH was 2.691. Retic count was 1.4%. Kappa free light chains were 35.8, lambda free light chains 20.5, and ratio 1.75 (0.26-1.65). M-spike was 0.   Patient was seen in cardiology by Murray Hodgkins, NP on 06/03/2019 for lower extremity edema. He denied any chest pains, palpitations, dizziness or dyspnea. Patient was prescribed Lasix 40 mg daily.   He had a follow up with Dr. Fletcher Anon on 07/07/2019. He was doing well. He had no chest pains. He had stable exertional dyspnea and mild leg edema. He was taking his medications regularly. He was tolerating Eliquis. He was to follow up in 4 months.   He had a follow up with Dr. Fletcher Anon on  11/25/2019. He was doing well. He had no  recent chest pain, shortness of breath or palpitations. He reported taking his Eliquis once daily instead of twice daily. He had occasional rectal bleeding thought to be due to hemorrhoids. He was scheduled to have endoscopic procedures in February 01/2020. His Eliquis was switched to 2.5 mg twice daily.   Labs on 11/30/2019 included a hematocrit 37.1, hemoglobin 11.9, platelets 177,000, WBC 7,700. BUN 40. Creatinine was 1.95. M-spike was 0. Kappa free light chains were 47.1, lambda free light chains were 24.1 and ratio 1.95 (0.26-1.65).   During the interim, he has done well. He notes a good energy level. He goes to the gym 3-5 times a week. His weight is down 6 pounds in the last 4 months. He back pain has improved. He denies any cough, shortness or breath or waking up short of breath. He notes frequency at night.   He notes a little blood in his stool secondary to hemorrhoids. He reports that GI suggested a endoscopy in 01/2020. He remains on Eliquis 2.5 mg twice a day.    Past Medical History:  Diagnosis Date  . Arthritis   . CAD (coronary artery disease)    a. 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 06/2018 Cath: Native 3VD, 2/3 patent grafts (VG->OM2 100)->Med rx; c. 05/2019 NSTEMI/Cath:  LM nl, LAD 99ost, 176m LCX 1059mRCA 100p, LIMA->LAD ok, VG->RPDA ok (? jump to OM), VG->OM not visualized (prev occluded). LVEDP 3020m-->Med Rx.  . CKD (chronic kidney disease), stage  III   . Gout   . Hernia, abdominal   . HFrEF (heart failure with reduced ejection fraction) ([facility])    a. 06/2018 Echo: EF 45-50%, mild MR; b. 05/2019 Echo: EF 30-35%, diff HK.  Marland Kitchen Hyperlipidemia LDL goal <70   . Hypertension   . Ischemic cardiomyopathy    a. 06/2018 Echo: EF 45-50%; b. 05/2019 Echo: EF 30-35%, diff HK.  . Myocardial infarction ([facility])   . PAF (paroxysmal atrial fibrillation) ([facility])    a. Dx 05/2019-->CHA2DS2VASc = 5-->Eliquis.    Past Surgical History:   Procedure Laterality Date  . carpel tunnel    . LEFT HEART CATH AND CORONARY ANGIOGRAPHY N/A 06/03/2018   Procedure: LEFT HEART CATH AND CORS/GRAFTS ANGIOGRAPHY;  Surgeon: Isaias Cowman, MD;  Location: [facility];  Service: Cardiovascular;  Laterality: N/A;  . LEFT HEART CATH AND CORS/GRAFTS ANGIOGRAPHY Bilateral 05/07/2019   Procedure: LEFT HEART CATH AND CORS/GRAFTS ANGIOGRAPHY;  Surgeon: Minna Merritts, MD;  Location: [facility];  Service: Cardiovascular;  Laterality: Bilateral;  . REPLACEMENT TOTAL KNEE    . VEIN BYPASS SURGERY      Family History  Problem Relation Age of Onset  . Asthma Mother   . Diabetes Father   . Hypertension Father     Social History:  reports that he has never smoked. He has never used smokeless tobacco. He reports that he does not drink alcohol or use drugs. The patient has been in the [employer]. He retired from the police and Research officer, trade union. He denies any exposure to radiation and toxins. The patient is alone today.  Participants in the patient's visit and their role in the encounter included the patient and Vito Berger, CMA, today.  The intake visit was provided by Vito Berger, CMA.  Allergies:  Allergies  Allergen Reactions  . Penicillins Rash and Other (See Comments)    Has patient had a PCN reaction causing immediate rash, facial/tongue/throat swelling, SOB or lightheadedness with hypotension: No Has patient had a PCN reaction causing severe rash involving mucus membranes or skin necrosis: No Has patient had a PCN reaction that required hospitalization: No Has patient had a PCN reaction occurring within the last 10 years: No If all of the above answers are "NO", then may proceed with Cephalosporin use.     Current Medications: Current Outpatient Medications  Medication Sig Dispense Refill  . amLODipine-benazepril (LOTREL) 10-40 MG capsule Take 1 capsule by mouth daily.   0  . apixaban (ELIQUIS) 2.5 MG TABS  tablet Take 1 tablet (2.5 mg total) by mouth 2 (two) times daily. 60 tablet 6  . atorvastatin (LIPITOR) 80 MG tablet Take 80 mg by mouth daily.     . carvedilol (COREG) 25 MG tablet Take 1 tablet (25 mg total) by mouth 2 (two) times daily with a meal. 180 tablet 3  . Coenzyme Q10 (COQ10) 100 MG CAPS Take 100 mg by mouth daily.    . furosemide (LASIX) 40 MG tablet Take 0.5 tablets (20 mg total) by mouth daily. 30 tablet 5  . isosorbide mononitrate (IMDUR) 60 MG 24 hr tablet Take 1 tablet (60 mg total) by mouth daily. 90 tablet 1  . Multiple Vitamins-Minerals (MENS 50+ MULTI VITAMIN/MIN) TABS Take 1 tablet by mouth daily.     . nitroGLYCERIN (NITROSTAT) 0.4 MG SL tablet Place 1 tablet (0.4 mg total) under the tongue every 5 (five) minutes as needed for chest pain (Nitro if taking third dose, call 911). 25 tablet 3  .  Omega-3 1000 MG CAPS Take 1,000 mg by mouth daily.     . fluticasone (FLONASE) 50 MCG/ACT nasal spray Place 1 spray into both nostrils 2 (two) times a day.     No current facility-administered medications for this visit.    Review of Systems  Constitutional: Positive for weight loss (6 pounds, intentional). Negative for chills, fever and malaise/fatigue.       Doing well. Active. Good energy.  HENT: Negative for congestion, ear pain, hearing loss, nosebleeds and sore throat.   Eyes: Negative.  Negative for blurred vision, double vision and photophobia.  Respiratory: Negative for cough, hemoptysis and shortness of breath.   Cardiovascular: Negative.  Negative for chest pain, palpitations and leg swelling.  Gastrointestinal: Positive for blood in stool (felt secondary to hemorrhoids). Negative for abdominal pain, diarrhea, heartburn, nausea and vomiting.  Genitourinary: Positive for frequency (night time). Negative for dysuria and hematuria.  Musculoskeletal: Negative.  Negative for back pain (LLQ, posterior iliac crest; improved), joint pain and myalgias.  Skin: Negative.  Negative  for rash.  Neurological: Negative.  Negative for dizziness, sensory change, speech change, focal weakness, weakness and headaches.  Endo/Heme/Allergies: Does not bruise/bleed easily.  Psychiatric/Behavioral: Negative.  Negative for depression and memory loss. The patient is not nervous/anxious and does not have insomnia.   All other systems reviewed and are negative.  Performance status (ECOG): 1  Physical Exam  Constitutional: He is oriented to person, place, and time.  Neurological: He is alert and oriented to person, place, and time.  Psychiatric: Mood, memory, affect and judgment normal.     Appointment on 11/30/2019  Component Date Value Ref Range Status  . Sodium 11/30/2019 139  135 - 145 mmol/L Final  . Potassium 11/30/2019 4.5  3.5 - 5.1 mmol/L Final  . Chloride 11/30/2019 108  98 - 111 mmol/L Final  . CO2 11/30/2019 22  22 - 32 mmol/L Final  . Glucose, Bld 11/30/2019 130* 70 - 99 mg/dL Final  . BUN 11/30/2019 40* 8 - 23 mg/dL Final  . Creatinine, Ser 11/30/2019 1.95* 0.61 - 1.24 mg/dL Final  . Calcium 11/30/2019 9.2  8.9 - 10.3 mg/dL Final  . Total Protein 11/30/2019 7.0  6.5 - 8.1 g/dL Final  . Albumin 11/30/2019 3.9  3.5 - 5.0 g/dL Final  . AST 11/30/2019 29  15 - 41 U/L Final  . ALT 11/30/2019 29  0 - 44 U/L Final  . Alkaline Phosphatase 11/30/2019 45  38 - 126 U/L Final  . Total Bilirubin 11/30/2019 0.7  0.3 - 1.2 mg/dL Final  . GFR calc non Af Amer 11/30/2019 32* >60 mL/min Final  . GFR calc Af Amer 11/30/2019 37* >60 mL/min Final  . Anion gap 11/30/2019 9  5 - 15 Final   Performed at [facility], [address] [place], [place] [address]  . WBC 11/30/2019 7.7  4.0 - 10.5 K/uL Final  . RBC 11/30/2019 3.87* 4.22 - 5.81 MIL/uL Final  . Hemoglobin 11/30/2019 11.9* 13.0 - 17.0 g/dL Final  . HCT 11/30/2019 37.1* 39.0 - 52.0 % Final  . MCV 11/30/2019 95.9  80.0 - 100.0 fL Final  . MCH 11/30/2019 30.7  26.0 - 34.0 pg Final  . MCHC 11/30/2019 32.1  30.0 -  36.0 g/dL Final  . RDW 11/30/2019 13.1  11.5 - 15.5 % Final  . Platelets 11/30/2019 177  150 - 400 K/uL Final  . nRBC 11/30/2019 0.0  0.0 - 0.2 % Final  . Neutrophils  Relative % 11/30/2019 70  % Final  . Neutro Abs 11/30/2019 5.3  1.7 - 7.7 K/uL Final  . Lymphocytes Relative 11/30/2019 15  % Final  . Lymphs Abs 11/30/2019 1.2  0.7 - 4.0 K/uL Final  . Monocytes Relative 11/30/2019 8  % Final  . Monocytes Absolute 11/30/2019 0.7  0.1 - 1.0 K/uL Final  . Eosinophils Relative 11/30/2019 7  % Final  . Eosinophils Absolute 11/30/2019 0.5  0.0 - 0.5 K/uL Final  . Basophils Relative 11/30/2019 0  % Final  . Basophils Absolute 11/30/2019 0.0  0.0 - 0.1 K/uL Final  . Immature Granulocytes 11/30/2019 0  % Final  . Abs Immature Granulocytes 11/30/2019 0.02  0.00 - 0.07 K/uL Final   Performed at [facility], [address] [place], [place] [address]    Assessment:  Mantaj Chamberlin is a 78 y.o. male with elevated free light chains secondary to chronic kidney disease (CKD).  Bone marrow biopsy on 05/01/2013 revealed a normocellular bone 40% with trilineage hematopoiesis and 1-2% polyclonal plasma cells.  Fat pad biopsy on 05/19/2013 did not reveal any evidence of malignancy or amyloidosis.    Skeletal survey on 05/04/2013 revealed lucencies of the calvarium, cervical spine, pelvis and proximal humeri which may represent metastases/myeloma.  Pelvic MRI on 05/20/2013 revealed no myelomatous lesions.  Lucencies were felt non-specific and likely artifact.  Hemoglobin has been stable between 12-13.  He has no hypercalcemia.  Creatinine has ranged between 1.50 - 1.85 between 09/16/2015 - 05/08/2019.  M-spike has been followed: 0 on 09/30/2017, 04/08/2018, 07/16/2018, 10/20/2018, and 06/02/2019.  Kappa free light chains have been followed: 41.7 (ratio 1.45) on 09/30/2017, 46.6 (ratio 2.09) on 04/08/2018, 36.1 (ratio 1.85) on 07/16/2018, 37.6 (ratio 1.75) on 10/10/2018, 35.8 (ratio 1.75) on  06/02/2019, and 47.1 (ratio 1.95) on 11/30/2019.  He was admitted to [facility] from 05/06/2019 - 05/08/2019 with atrial fibrillation and RVR.  He underwent cardiac cath which showed chronically stable coronary artery disease.  Echo on 05/07/2019 revealed an ejection fraction of 30-35%. He was started on Eliquis.  Symptomatically, he is doing well.  He is active.  He denies any concerns.  Plan: 1.   Review labs from 11/30/2019. 2.   Elevated free light chains             Clinically, he is doing well.  M-spike 0.  Kappa free light chains 47.1 (ratio 1.95) on 11/30/2019.             Etiology felt secondary to chronic kidney disease.               No current evidence of an underlying plasmacytic disorder. 3.   Anemia of chronic kidney disease              Creatinine 1.95.             B12, folate, TSH, ferritin, iron studies, retic count were normal on 06/02/2019. 4.   RTC in 6 months for labs (CBC with diff, BMP, SPEP, FLCA). 5.   RTC in 1 year for MD assessment and labs (CBC with diff, CMP, SPEP, FLCA).  I discussed the assessment and treatment plan with the patient.  The patient was provided an opportunity to ask questions and all were answered.  The patient agreed with the plan and demonstrated an understanding of the instructions.  The patient was advised to call back if the symptoms worsen or if the condition fails to improve as anticipated.  I provided  8 minutes (11:04 AM - 11:11 AM) of non face-to-face visit time during this this encounter and > 50% was spent counseling as documented under my assessment and plan.  I provided these services from the [facility] office.   Lequita Asal, MD, PhD    12/01/2019, 11:04 AM  I, Selena Batten, am acting as scribe for [employer]. Mike Gip, MD, PhD.  I, Rickesha Veracruz C. Mike Gip, MD, have reviewed the above documentation for accuracy and completeness, and I agree with the above.

## 2019-12-01 ENCOUNTER — Encounter: Payer: Self-pay | Admitting: Hematology and Oncology

## 2019-12-01 ENCOUNTER — Other Ambulatory Visit: Payer: Medicare Other

## 2019-12-01 ENCOUNTER — Inpatient Hospital Stay (HOSPITAL_BASED_OUTPATIENT_CLINIC_OR_DEPARTMENT_OTHER): Payer: Medicare Other | Admitting: Hematology and Oncology

## 2019-12-01 DIAGNOSIS — N189 Chronic kidney disease, unspecified: Secondary | ICD-10-CM | POA: Diagnosis not present

## 2019-12-01 DIAGNOSIS — R768 Other specified abnormal immunological findings in serum: Secondary | ICD-10-CM | POA: Diagnosis not present

## 2019-12-01 DIAGNOSIS — D631 Anemia in chronic kidney disease: Secondary | ICD-10-CM

## 2019-12-01 LAB — PROTEIN ELECTROPHORESIS, SERUM
A/G Ratio: 1.6 (ref 0.7–1.7)
Albumin ELP: 4 g/dL (ref 2.9–4.4)
Alpha-1-Globulin: 0.2 g/dL (ref 0.0–0.4)
Alpha-2-Globulin: 0.8 g/dL (ref 0.4–1.0)
Beta Globulin: 0.9 g/dL (ref 0.7–1.3)
Gamma Globulin: 0.6 g/dL (ref 0.4–1.8)
Globulin, Total: 2.5 g/dL (ref 2.2–3.9)
Total Protein ELP: 6.5 g/dL (ref 6.0–8.5)

## 2019-12-01 LAB — KAPPA/LAMBDA LIGHT CHAINS
Kappa free light chain: 47.1 mg/L — ABNORMAL HIGH (ref 3.3–19.4)
Kappa, lambda light chain ratio: 1.95 — ABNORMAL HIGH (ref 0.26–1.65)
Lambda free light chains: 24.1 mg/L (ref 5.7–26.3)

## 2019-12-01 NOTE — Progress Notes (Signed)
No new changes noted today. The patient Name and DOB has been verified by phone today. 

## 2019-12-07 ENCOUNTER — Telehealth: Payer: Self-pay | Admitting: Cardiovascular Disease

## 2019-12-07 NOTE — Telephone Encounter (Signed)
Patient with diagnosis of atrial fibrillation on Eliquis for anticoagulation.    Procedure: colon EGD Date of procedure: 01/13/20  CHADS2-VASc score of  5 (CHF, HTN, AGE x 2, CAD)  CrCl 52 Platelet count 177  Per office protocol, patient can hold Eliquis for 2 days prior to procedure.

## 2019-12-07 NOTE — Telephone Encounter (Signed)
   [facility] Pre-operative Risk Assessment    Request for surgical clearance:  1. What type of surgery is being performed? Colon EGD  2. When is this surgery scheduled? 01/13/20  3. What type of clearance is required (medical clearance vs. Pharmacy clearance to hold med vs. Both)? both  4. Are there any medications that need to be held prior to surgery and how long? Eliquis recommendations   5. Practice name and name of physician performing surgery? [facility], Dr Alice Reichert  6. What is your office phone number    7.   What is your office fax number [phone]  8.   Anesthesia type (None, local, MAC, general) ? General    Caryl Pina Gerringer 12/07/2019, 3:02 PM  _________________________________________________________________   (provider comments below)

## 2019-12-07 NOTE — Telephone Encounter (Signed)
   Primary Cardiologist: [name], MD  Chart reviewed as part of pre-operative protocol coverage. [name] is followed by Dr. Fletcher Anon for coronary artery disease, chronic systolic heart failure and paroxysmal atrial fibrillation.Was lst seen on 11/25/2019.He was stable from CV standpoint.   Given past medical history and time since last visit, based on ACC/AHA guidelines, Jeff Henderson would be at acceptable risk for the planned procedure without further cardiovascular testing.   CHADS2-VASc score of  5 (CHF, HTN, AGE x 2, CAD)  CrCl 52 Platelet count 177  Per office protocol, patient can hold Eliquis for 2 days prior to procedure.     I will route this recommendation to the requesting party via Epic fax function and remove from pre-op pool.  Please call with questions.  Phill Myron. West Pugh, ANP, AACC  12/07/2019, 4:37 PM

## 2019-12-24 ENCOUNTER — Ambulatory Visit (INDEPENDENT_AMBULATORY_CARE_PROVIDER_SITE_OTHER): Payer: Medicare Other

## 2019-12-24 ENCOUNTER — Other Ambulatory Visit: Payer: Self-pay

## 2019-12-24 DIAGNOSIS — I5022 Chronic systolic (congestive) heart failure: Secondary | ICD-10-CM | POA: Diagnosis not present

## 2019-12-24 MED ORDER — PERFLUTREN LIPID MICROSPHERE
1.0000 mL | INTRAVENOUS | Status: AC | PRN
  Administered 2019-12-24: 2 mL via INTRAVENOUS

## 2019-12-25 ENCOUNTER — Telehealth: Payer: Self-pay

## 2019-12-25 NOTE — Telephone Encounter (Signed)
Patient made aware of echo results with verbalized understanding.

## 2019-12-25 NOTE — Telephone Encounter (Signed)
-----   Message from Wellington Hampshire, MD sent at 12/25/2019 10:55 AM EST ----- Inform patient that echo showed slight improvement in LV systolic function from before.  Continue same medications for now but we will consider switching to [facility] when he follows up with me in April.  I elected not to do that now given underlying chronic kidney disease.

## 2019-12-25 NOTE — Telephone Encounter (Signed)
Called to give the patient echo results. DPR on file. lmom with results. Patient is to contact the office if any questions.Marland Kitchen

## 2020-01-11 ENCOUNTER — Other Ambulatory Visit
Admission: RE | Admit: 2020-01-11 | Discharge: 2020-01-11 | Disposition: A | Discharge status: Home / Self Care | Payer: Medicare Other | Source: Ambulatory Visit | Attending: Internal Medicine | Admitting: Internal Medicine

## 2020-01-11 ENCOUNTER — Other Ambulatory Visit: Payer: Self-pay

## 2020-01-11 DIAGNOSIS — Z01812 Encounter for preprocedural laboratory examination: Secondary | ICD-10-CM | POA: Diagnosis present

## 2020-01-11 DIAGNOSIS — Z20822 Contact with and (suspected) exposure to covid-19: Secondary | ICD-10-CM | POA: Diagnosis not present

## 2020-01-12 LAB — SARS CORONAVIRUS 2 (TAT 6-24 HRS): SARS Coronavirus 2: NEGATIVE

## 2020-01-13 ENCOUNTER — Encounter: Payer: Self-pay | Admitting: Internal Medicine

## 2020-01-13 ENCOUNTER — Encounter: Admission: RE | Payer: Self-pay | Source: Home / Self Care

## 2020-01-13 ENCOUNTER — Ambulatory Visit: Admission: RE | Admit: 2020-01-13 | Payer: Medicare Other | Source: Home / Self Care | Admitting: Internal Medicine

## 2020-01-13 SURGERY — COLONOSCOPY WITH PROPOFOL
Anesthesia: General

## 2020-01-14 ENCOUNTER — Ambulatory Visit: Payer: Medicare Other | Admitting: Registered Nurse

## 2020-01-14 ENCOUNTER — Encounter: Payer: Self-pay | Admitting: Internal Medicine

## 2020-01-14 ENCOUNTER — Ambulatory Visit
Admission: RE | Admit: 2020-01-14 | Discharge: 2020-01-14 | Disposition: A | Discharge status: Home / Self Care | Payer: Medicare Other | Attending: Internal Medicine | Admitting: Internal Medicine

## 2020-01-14 ENCOUNTER — Encounter
Admission: RE | Disposition: A | Discharge status: Home / Self Care | Payer: Self-pay | Source: Home / Self Care | Attending: Internal Medicine

## 2020-01-14 ENCOUNTER — Other Ambulatory Visit: Payer: Self-pay

## 2020-01-14 DIAGNOSIS — K5730 Diverticulosis of large intestine without perforation or abscess without bleeding: Secondary | ICD-10-CM | POA: Insufficient documentation

## 2020-01-14 DIAGNOSIS — K269 Duodenal ulcer, unspecified as acute or chronic, without hemorrhage or perforation: Secondary | ICD-10-CM | POA: Insufficient documentation

## 2020-01-14 DIAGNOSIS — I2510 Atherosclerotic heart disease of native coronary artery without angina pectoris: Secondary | ICD-10-CM | POA: Diagnosis not present

## 2020-01-14 DIAGNOSIS — M1990 Unspecified osteoarthritis, unspecified site: Secondary | ICD-10-CM | POA: Diagnosis not present

## 2020-01-14 DIAGNOSIS — I252 Old myocardial infarction: Secondary | ICD-10-CM | POA: Insufficient documentation

## 2020-01-14 DIAGNOSIS — Z7901 Long term (current) use of anticoagulants: Secondary | ICD-10-CM | POA: Insufficient documentation

## 2020-01-14 DIAGNOSIS — K640 First degree hemorrhoids: Secondary | ICD-10-CM | POA: Diagnosis not present

## 2020-01-14 DIAGNOSIS — I130 Hypertensive heart and chronic kidney disease with heart failure and stage 1 through stage 4 chronic kidney disease, or unspecified chronic kidney disease: Secondary | ICD-10-CM | POA: Insufficient documentation

## 2020-01-14 DIAGNOSIS — I255 Ischemic cardiomyopathy: Secondary | ICD-10-CM | POA: Diagnosis not present

## 2020-01-14 DIAGNOSIS — N400 Enlarged prostate without lower urinary tract symptoms: Secondary | ICD-10-CM | POA: Insufficient documentation

## 2020-01-14 DIAGNOSIS — N183 Chronic kidney disease, stage 3 unspecified: Secondary | ICD-10-CM | POA: Diagnosis not present

## 2020-01-14 DIAGNOSIS — K529 Noninfective gastroenteritis and colitis, unspecified: Secondary | ICD-10-CM | POA: Diagnosis present

## 2020-01-14 DIAGNOSIS — R1013 Epigastric pain: Secondary | ICD-10-CM | POA: Insufficient documentation

## 2020-01-14 DIAGNOSIS — Z79899 Other long term (current) drug therapy: Secondary | ICD-10-CM | POA: Insufficient documentation

## 2020-01-14 DIAGNOSIS — K3189 Other diseases of stomach and duodenum: Secondary | ICD-10-CM | POA: Insufficient documentation

## 2020-01-14 DIAGNOSIS — I480 Paroxysmal atrial fibrillation: Secondary | ICD-10-CM | POA: Diagnosis not present

## 2020-01-14 DIAGNOSIS — E785 Hyperlipidemia, unspecified: Secondary | ICD-10-CM | POA: Diagnosis not present

## 2020-01-14 DIAGNOSIS — K2970 Gastritis, unspecified, without bleeding: Secondary | ICD-10-CM | POA: Diagnosis not present

## 2020-01-14 DIAGNOSIS — K2980 Duodenitis without bleeding: Secondary | ICD-10-CM | POA: Diagnosis not present

## 2020-01-14 HISTORY — DX: Benign prostatic hyperplasia without lower urinary tract symptoms: N40.0

## 2020-01-14 HISTORY — PX: COLONOSCOPY WITH PROPOFOL: SHX5780

## 2020-01-14 HISTORY — DX: Monoclonal gammopathy: D47.2

## 2020-01-14 HISTORY — PX: ESOPHAGOGASTRODUODENOSCOPY (EGD) WITH PROPOFOL: SHX5813

## 2020-01-14 HISTORY — DX: Idiopathic chronic gout, unspecified site, with tophus (tophi): M1A.00X1

## 2020-01-14 HISTORY — DX: Cyst of kidney, acquired: N28.1

## 2020-01-14 SURGERY — COLONOSCOPY WITH PROPOFOL
Anesthesia: General

## 2020-01-14 MED ORDER — SODIUM CHLORIDE 0.9 % IV SOLN
INTRAVENOUS | Status: DC
  Administered 2020-01-14: 1000 mL via INTRAVENOUS

## 2020-01-14 MED ORDER — LIDOCAINE HCL (CARDIAC) PF 100 MG/5ML IV SOSY
PREFILLED_SYRINGE | INTRAVENOUS | Status: DC | PRN
  Administered 2020-01-14: 60 mg via INTRAVENOUS

## 2020-01-14 MED ORDER — PROPOFOL 10 MG/ML IV BOLUS
INTRAVENOUS | Status: DC | PRN
  Administered 2020-01-14: 70 mg via INTRAVENOUS
  Administered 2020-01-14: 30 mg via INTRAVENOUS
  Administered 2020-01-14: 20 mg via INTRAVENOUS

## 2020-01-14 MED ORDER — PHENYLEPHRINE HCL (PRESSORS) 10 MG/ML IV SOLN
INTRAVENOUS | Status: DC | PRN
  Administered 2020-01-14: 200 ug via INTRAVENOUS
  Administered 2020-01-14: 300 ug via INTRAVENOUS
  Administered 2020-01-14: 200 ug via INTRAVENOUS

## 2020-01-14 MED ORDER — PROPOFOL 500 MG/50ML IV EMUL
INTRAVENOUS | Status: DC | PRN
  Administered 2020-01-14: 140 ug/kg/min via INTRAVENOUS

## 2020-01-14 MED ORDER — EPHEDRINE SULFATE 50 MG/ML IJ SOLN
INTRAMUSCULAR | Status: DC | PRN
  Administered 2020-01-14 (×2): 10 mg via INTRAVENOUS
  Administered 2020-01-14: 5 mg via INTRAVENOUS

## 2020-01-14 NOTE — Op Note (Signed)
[facility] Patient Name: Jeff Henderson Procedure Date: 01/14/2020 10:18 AM MRN: [id number] Account #: [id number] Date of Birth: [birth year] Admit Type: Outpatient Age: 79 Room: [facility] ENDO ROOM 3 Gender: Male Note Status: Finalized Procedure:             Upper GI endoscopy Indications:           Epigastric abdominal pain, Positive celiac serologies,                         Diarrhea Providers:             Benay Pike. Alice Reichert MD, MD Referring MD:          No Local Md, MD (Referring MD) Medicines:             Propofol per Anesthesia Complications:         No immediate complications. Procedure:             Pre-Anesthesia Assessment:                        - The risks and benefits of the procedure and the                         sedation options and risks were discussed with the                         patient. All questions were answered and informed                         consent was obtained.                        - Patient identification and proposed procedure were                         verified prior to the procedure by the nurse. The                         procedure was verified in the procedure room.                        - ASA Grade Assessment: III - A patient with severe                         systemic disease.                        - After reviewing the risks and benefits, the patient                         was deemed in satisfactory condition to undergo the                         procedure.                        After obtaining informed consent, the endoscope was                         passed under direct vision. Throughout the procedure,  the patient's blood pressure, pulse, and oxygen                         saturations were monitored continuously. The Endoscope                         was introduced through the mouth, and advanced to the                         third part of duodenum. The upper GI endoscopy  was                         accomplished without difficulty. The patient tolerated                         the procedure well. Findings:      The esophagus was normal.      Patchy mild inflammation characterized by erosions and erythema was       found in the gastric antrum.      The cardia and gastric fundus were normal on retroflexion.      Decreased folds were found in the second portion of the duodenum,       decreased folds were found in the third portion of the duodenum and       scalloped mucosa was found in the second portion of the duodenum.       Biopsies for histology were taken with a cold forceps for evaluation of       celiac disease.      Multiple diffuse erosions without bleeding were found in the first       portion of the duodenum, in the second portion of the duodenum and in       the third portion of the duodenum.      The exam was otherwise without abnormality. Impression:            - Normal esophagus.                        - Gastritis.                        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 Biopsied.                        - Duodenal erosions without bleeding.                        - The examination was otherwise normal. Recommendation:        - Await pathology results.                        - Proceed with colonoscopy Procedure Code(s):     --- Professional ---                        [phone], Esophagogastroduodenoscopy, flexible,                         transoral; with biopsy, single  or multiple Diagnosis Code(s):     --- Professional ---                        R19.7, Diarrhea, unspecified                        R76.8, Other specified abnormal immunological findings                         in serum                        R10.13, Epigastric pain                        K26.9, Duodenal ulcer, unspecified as acute or                         chronic, without hemorrhage or perforation                         K31.89, Other diseases of stomach and duodenum                        K29.70, Gastritis, unspecified, without bleeding CPT copyright 2019 American Medical Association. All rights reserved. The codes documented in this report are preliminary and upon coder review may  be revised to meet current compliance requirements. Efrain Sella MD, MD 01/14/2020 10:31:22 AM This report has been signed electronically. Number of Addenda: 0 Note Initiated On: 01/14/2020 10:18 AM Estimated Blood Loss:  Estimated blood loss: none.      [facility]

## 2020-01-14 NOTE — H&P (Signed)
Outpatient short stay form Pre-procedure 01/14/2020 10:05 AM Jeff Gilmer K. Jeff Henderson, M.D.  Primary Physician: Nelwyn Salisbury, PA-C  Reason for visit:  Hematochezia, diarrhea, POSITIVE Celiac Ab testing, epigastric pain, hemorrhoids  History of present illness: As above. Patient presents for epigastric pain, bloating and diarrhea with positive celiac Ab profile. He is here for definitive confirmatory pathology supporting the diagnosis of celiac disease to be managed with a gluten free diet.  Patient also has hx of anal outlet bleeding with hx of presumed hemorrhoids, but given diarrhea other causes should be ruled out, including malignancy.     Current Facility-Administered Medications:  .  0.9 %  sodium chloride infusion, , Intravenous, Continuous, [place], Benay Pike, MD, Last Rate: 20 mL/hr at 01/14/20 0940, 1,000 mL at 01/14/20 0940  Medications Prior to Admission  Medication Sig Dispense Refill Last Dose  . amLODipine-benazepril (LOTREL) 10-40 MG capsule Take 1 capsule by mouth daily.   0 01/14/2020 at Unknown time  . carvedilol (COREG) 25 MG tablet Take 1 tablet (25 mg total) by mouth 2 (two) times daily with a meal. 180 tablet 3 01/14/2020 at Unknown time  . famotidine (PEPCID) 20 MG tablet Take 20 mg by mouth 2 (two) times daily.     Marland Kitchen apixaban (ELIQUIS) 2.5 MG TABS tablet Take 1 tablet (2.5 mg total) by mouth 2 (two) times daily. 60 tablet 6 01/11/2020  . atorvastatin (LIPITOR) 80 MG tablet Take 80 mg by mouth daily.      . Coenzyme Q10 (COQ10) 100 MG CAPS Take 100 mg by mouth daily.     . fluticasone (FLONASE) 50 MCG/ACT nasal spray Place 1 spray into both nostrils 2 (two) times a day.     . furosemide (LASIX) 40 MG tablet Take 0.5 tablets (20 mg total) by mouth daily. 30 tablet 5   . isosorbide mononitrate (IMDUR) 60 MG 24 hr tablet Take 1 tablet (60 mg total) by mouth daily. 90 tablet 1   . Multiple Vitamins-Minerals (MENS 50+ MULTI VITAMIN/MIN) TABS Take 1 tablet by mouth daily.      .  nitroGLYCERIN (NITROSTAT) 0.4 MG SL tablet Place 1 tablet (0.4 mg total) under the tongue every 5 (five) minutes as needed for chest pain (Nitro if taking third dose, call 911). 25 tablet 3   . Omega-3 1000 MG CAPS Take 1,000 mg by mouth daily.         Allergies  Allergen Reactions  . Penicillins Rash and Other (See Comments)    Has patient had a PCN reaction causing immediate rash, facial/tongue/throat swelling, SOB or lightheadedness with hypotension: No Has patient had a PCN reaction causing severe rash involving mucus membranes or skin necrosis: No Has patient had a PCN reaction that required hospitalization: No Has patient had a PCN reaction occurring within the last 10 years: No If all of the above answers are "NO", then may proceed with Cephalosporin use.      Past Medical History:  Diagnosis Date  . Arthritis   . BPH (benign prostatic hyperplasia)   . CAD (coronary artery disease)    a. 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 06/2018 Cath: Native 3VD, 2/3 patent grafts (VG->OM2 100)->Med rx; c. 05/2019 NSTEMI/Cath:  LM nl, LAD 99ost, 166m LCX 10110mRCA 100p, LIMA->LAD ok, VG->RPDA ok (? jump to OM), VG->OM not visualized (prev occluded). LVEDP 3070m-->Med Rx.  . CKD (chronic kidney disease), stage III   . Gout   . Gouty arthropathy, chronic, with tophi   .  Hernia, abdominal   . HFrEF (heart failure with reduced ejection fraction) ([place])    a. 06/2018 Echo: EF 45-50%, mild MR; b. 05/2019 Echo: EF 30-35%, diff HK.  Marland Kitchen Hyperlipidemia LDL goal <70   . Hypertension   . Ischemic cardiomyopathy    a. 06/2018 Echo: EF 45-50%; b. 05/2019 Echo: EF 30-35%, diff HK.  Marland Kitchen MGUS (monoclonal gammopathy of unknown significance)   . Myocardial infarction ([place])   . PAF (paroxysmal atrial fibrillation) ([place])    a. Dx 05/2019-->CHA2DS2VASc = 5-->Eliquis.  . Renal cyst     Review of systems:  Otherwise negative.    Physical Exam  Gen: Alert, oriented. Appears stated age.  HEENT:  [place]/AT. PERRLA. Lungs: CTA, no wheezes. CV: RR nl S1, S2. Abd: soft, benign, no masses. BS+ Ext: No edema. Pulses 2+    Planned procedures: Proceed with EGD and colonoscopy. The patient understands the nature of the planned procedure, indications, risks, alternatives and potential complications including but not limited to bleeding, infection, perforation, damage to internal organs and possible oversedation/side effects from anesthesia. The patient agrees and gives consent to proceed.  Please refer to procedure notes for findings, recommendations and patient disposition/instructions.     Jeff Henderson, M.D. Gastroenterology 01/14/2020  10:05 AM

## 2020-01-14 NOTE — OR Nursing (Signed)
Pt;s bp continues to be low .Deanna called she administered BP medication to elevate bp. @11 ;16AM.Continuing to monitor  patiient per protocols.

## 2020-01-14 NOTE — Anesthesia Preprocedure Evaluation (Signed)
Anesthesia Evaluation  Patient identified by MRN, date of birth, ID band Patient awake    Reviewed: Allergy & Precautions, H&P , NPO status , Patient's Chart, lab work & pertinent test results, reviewed documented beta blocker date and time   Airway Mallampati: II   Neck ROM: full    Dental  (+) Poor Dentition   Pulmonary neg pulmonary ROS,    Pulmonary exam normal        Cardiovascular hypertension, (-) angina+ CAD and + Past MI  Normal cardiovascular exam Rhythm:regular Rate:Normal     Neuro/Psych negative neurological ROS  negative psych ROS   GI/Hepatic negative GI ROS, Neg liver ROS,   Endo/Other  negative endocrine ROS  Renal/GU Renal disease  negative genitourinary   Musculoskeletal   Abdominal   Peds  Hematology negative hematology ROS (+)   Anesthesia Other Findings Past Medical History: No date: Arthritis No date: BPH (benign prostatic hyperplasia) No date: CAD (coronary artery disease)     Comment:  a. 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               06/2018 Cath: Native 3VD, 2/3 patent grafts (VG->OM2               100)->Med rx; c. 05/2019 NSTEMI/Cath:  LM nl, LAD 99ost,               179m LCX 1018mRCA 100p, LIMA->LAD ok, VG->RPDA ok (?               jump to OM), VG->OM not visualized (prev occluded). LVEDP              3064m-->Med Rx. No date: CKD (chronic kidney disease), stage III No date: Gout No date: Gouty arthropathy, chronic, with tophi No date: Hernia, abdominal No date: HFrEF (heart failure with reduced ejection fraction) ([place]   Comment:  a. 06/2018 Echo: EF 45-50%, mild MR; b. 05/2019 Echo: EF               30-35%, diff HK. No date: Hyperlipidemia LDL goal <70 No date: Hypertension No date: Ischemic cardiomyopathy     Comment:  a. 06/2018 Echo: EF 45-50%; b. 05/2019 Echo: EF 30-35%,               diff HK. No date: MGUS (monoclonal gammopathy of unknown  significance) No date: [place]ocardial infarction ([place] date: PAF (paroxysmal atrial fibrillation) ([facility])     Comment:  a. Dx 05/2019-->CHA2DS2VASc = 5-->Eliquis. No date: Renal cyst Past Surgical History: No date: CARDIAC CATHETERIZATION No date: carpel tunnel No date: CORONARY ARTERY BYPASS GRAFT 2002  2011: JOINT REPLACEMENT; Bilateral 06/03/2018: LEFT HEART CATH AND CORONARY ANGIOGRAPHY; N/A     Comment:  Procedure: LEFT HEART CATH AND CORS/GRAFTS ANGIOGRAPHY;               Surgeon: ParIsaias CowmanD;  [facility];  Service: Cardiovascular;  Laterality:               N/A; 05/07/2019: LEFT HEART CATH AND CORS/GRAFTS ANGIOGRAPHY; Bilateral     Comment:  Procedure: LEFT HEART CATH AND CORS/GRAFTS ANGIOGRAPHY;               Surgeon: GolMinna MerrittsD;  [facility];  Service: Cardiovascular;  Laterality: Bilateral; No date: REPLACEMENT TOTAL KNEE No date: VEIN BYPASS SURGERY BMI    Body Mass Index: 36.18 kg/m     Reproductive/Obstetrics negative OB ROS                             Anesthesia Physical Anesthesia Plan  ASA: III  Anesthesia Plan: General   Post-op Pain Management:    Induction:   PONV Risk Score and Plan:   Airway Management Planned:   Additional Equipment:   Intra-op Plan:   Post-operative Plan:   Informed Consent: I have reviewed the patients History and Physical, chart, labs and discussed the procedure including the risks, benefits and alternatives for the proposed anesthesia with the patient or authorized representative who has indicated his/her understanding and acceptance.     Dental Advisory Given  Plan Discussed with: CRNA  Anesthesia Plan Comments:         Anesthesia Quick Evaluation

## 2020-01-14 NOTE — Interval H&P Note (Signed)
History and Physical Interval Note:  01/14/2020 10:08 AM  Jeff Henderson  has presented today for surgery, with the diagnosis of RECTAL BLEEDING, DIARRHEA POSITIVE AUTO ANTIBODY SCREENING FOR CELIAC DISEASE.  The various methods of treatment have been discussed with the patient and family. After consideration of risks, benefits and other options for treatment, the patient has consented to  Procedure(s): COLONOSCOPY WITH PROPOFOL (N/A) ESOPHAGOGASTRODUODENOSCOPY (EGD) WITH PROPOFOL (N/A) as a surgical intervention.  The patient's history has been reviewed, patient examined, no change in status, stable for surgery.  I have reviewed the patient's chart and labs.  Questions were answered to the patient's satisfaction.     [name], [place]

## 2020-01-14 NOTE — Op Note (Signed)
[facility] Gastroenterology Patient Name: Jeff Henderson Procedure Date: 01/14/2020 10:17 AM MRN: [id number] Account #: [id number] Date of Birth: 05/10/1941 Admit Type: Outpatient Age: 79 Room: [facility] ENDO ROOM 3 Gender: Male Note Status: Finalized Procedure:             Colonoscopy Indications:           Chronic diarrhea Providers:             Benay Pike. Alice Reichert MD, MD Referring MD:          No Local Md, MD (Referring MD) Medicines:             Propofol per Anesthesia Complications:         No immediate complications. Procedure:             Pre-Anesthesia Assessment:                        - The risks and benefits of the procedure and the                         sedation options and risks were discussed with the                         patient. All questions were answered and informed                         consent was obtained.                        - Patient identification and proposed procedure were                         verified prior to the procedure by the nurse. The                         procedure was verified in the procedure room.                        - ASA Grade Assessment: III - A patient with severe                         systemic disease.                        - After reviewing the risks and benefits, the patient                         was deemed in satisfactory condition to undergo the                         procedure.                        After obtaining informed consent, the colonoscope was                         passed under direct vision. Throughout the procedure,                         the patient's blood pressure, pulse, and oxygen  saturations were monitored continuously. The                         Colonoscope was introduced through the anus and                         advanced to the the cecum, identified by appendiceal                         orifice and ileocecal valve. The colonoscopy was              performed without difficulty. The patient tolerated                         the procedure well. The quality of the bowel                         preparation was adequate. Findings:      The perianal and digital rectal examinations were normal. Pertinent       negatives include normal sphincter tone and no palpable rectal lesions.      Multiple small and large-mouthed diverticula were found in the sigmoid       colon. Biopsies for histology were taken with a cold forceps from the       random colon for evaluation of microscopic colitis.      Non-bleeding internal hemorrhoids were found during retroflexion. The       hemorrhoids were Grade I (internal hemorrhoids that do not prolapse).      The exam was otherwise without abnormality. Impression:            - Diverticulosis in the sigmoid colon. Biopsied.                        - Non-bleeding internal hemorrhoids.                        - The examination was otherwise normal. Recommendation:        - Await pathology results from EGD, also performed                         today.                        - Patient has a contact number available for                         emergencies. The signs and symptoms of potential                         delayed complications were discussed with the patient.                         Return to normal activities tomorrow. Written                         discharge instructions were provided to the patient.                        - Resume previous diet.                        -  Continue present medications.                        - Await pathology results.                        - NO repeat colonoscopy due to age/comorbid status                        - Return to physician assistant in 6 weeks.                        - Follow up with Octavia Bruckner, PA-C in [ ]  months. Procedure Code(s):     --- Professional ---                        [phone], Colonoscopy, flexible; with biopsy, single or                          multiple Diagnosis Code(s):     --- Professional ---                        K57.30, Diverticulosis of large intestine without                         perforation or abscess without bleeding                        K52.9, Noninfective gastroenteritis and colitis,                         unspecified                        K64.0, First degree hemorrhoids CPT copyright 2019 American Medical Association. All rights reserved. The codes documented in this report are preliminary and upon coder review may  be revised to meet current compliance requirements. Efrain Sella MD, MD 01/14/2020 10:46:59 AM This report has been signed electronically. Number of Addenda: 0 Note Initiated On: 01/14/2020 10:17 AM Scope Withdrawal Time: 0 hours 5 minutes 57 seconds  Total Procedure Duration: 0 hours 8 minutes 41 seconds  Estimated Blood Loss:  Estimated blood loss: none.      [facility]

## 2020-01-14 NOTE — Transfer of Care (Signed)
Immediate Anesthesia Transfer of Care Note  Patient: Khallid Pasillas  Procedure(s) Performed: COLONOSCOPY WITH PROPOFOL (N/A ) ESOPHAGOGASTRODUODENOSCOPY (EGD) WITH PROPOFOL (N/A )  Patient Location: PACU  Anesthesia Type:General  Level of Consciousness: sedated  Airway & Oxygen Therapy: Patient Spontanous Breathing  Post-op Assessment: Report given to RN and Post -op Vital signs reviewed and stable  Post vital signs: Reviewed and stable  Last Vitals:  Vitals Value Taken Time  BP 89/46 01/14/20 1046  Temp    Pulse 66 01/14/20 1046  Resp 18 01/14/20 1046  SpO2 96 % 01/14/20 1046  Vitals shown include unvalidated device data.  Last Pain:  Vitals:   01/14/20 0919  TempSrc: Skin  PainSc: 0-No pain         Complications: No apparent anesthesia complications

## 2020-01-15 ENCOUNTER — Encounter: Payer: Self-pay | Admitting: *Deleted

## 2020-01-15 LAB — SURGICAL PATHOLOGY

## 2020-01-15 NOTE — Anesthesia Postprocedure Evaluation (Signed)
Anesthesia Post Note  Patient: Jeff Henderson  Procedure(s) Performed: COLONOSCOPY WITH PROPOFOL (N/A ) ESOPHAGOGASTRODUODENOSCOPY (EGD) WITH PROPOFOL (N/A )  Patient location during evaluation: PACU Anesthesia Type: General Level of consciousness: awake and alert Pain management: pain level controlled Vital Signs Assessment: post-procedure vital signs reviewed and stable Respiratory status: spontaneous breathing, nonlabored ventilation, respiratory function stable and patient connected to nasal cannula oxygen Cardiovascular status: blood pressure returned to baseline and stable Postop Assessment: no apparent nausea or vomiting Anesthetic complications: no     Last Vitals:  Vitals:   01/14/20 1130 01/14/20 1140  BP: 109/61 (!) 102/54  Pulse: 65 67  Resp:    Temp:    SpO2: 97% 97%    Last Pain:  Vitals:   01/14/20 1040  TempSrc: Temporal  PainSc:                  Molli Barrows

## 2020-02-09 ENCOUNTER — Telehealth: Payer: Self-pay | Admitting: Cardiovascular Disease

## 2020-02-09 NOTE — Telephone Encounter (Signed)
   Primary Cardiologist: [name], MD  Chart reviewed as part of pre-operative protocol coverage. Given past medical history and time since last visit, based on ACC/AHA guidelines, Jeff Henderson would be at acceptable risk for the planned procedure without further cardiovascular testing.   His RCRI is a class III risk, 6.6% risk of major cardiac event.  Patient with diagnosis of atrial fibrillation on apixaban 2.73m twice daily for anticoagulation. Noted in visit from 11/2019 that patient was taking Eliquis once daily instead of twice daily.   Procedure: left ring finger subtotal palmar fasciotomy   Date of procedure: TBD   CHADS2-VASc score of 5 (CHF, HTN, AGE, CAD)   CrCl 37 (CKD Stage III)   Platelet count 177   Per office protocol, patient can hold Eliquis for 1-2 days prior to procedure.   I will route this recommendation to the requesting party via Epic fax function and remove from pre-op pool.  Please call with questions.   [facility]ossie Ng [facility] [address] Office [phone] [phone]

## 2020-02-09 NOTE — Telephone Encounter (Signed)
Patient with diagnosis of atrial fibrillation on apixaban 2.36m twice daily for anticoagulation.  Noted in visit from 11/2019 that patient was taking Eliquis once daily instead of twice daily.  Procedure: left ring finger subtotal palmar fasciotomy  Date of procedure: TBD  CHADS2-VASc score of  5 (CHF, HTN, AGE, CAD)  CrCl 37 (CKD Stage III) Platelet count 177  Per office protocol, patient can hold Eliquis for 1-2 days prior to procedure.

## 2020-02-09 NOTE — Telephone Encounter (Signed)
   [facility] Pre-operative Risk Assessment    Request for surgical clearance:  1. What type of surgery is being performed? Left ring finger subtotal palmar fasciectom  When is this surgery scheduled? TBD  2. What type of clearance is required (medical clearance vs. Pharmacy clearance to hold med vs. Both)?  Not listed 3.  4. Are there any medications that need to be held prior to surgery and how long? Not listed  5. Practice name and name of physician performing surgery? [facility] Dr. Roseanne Kaufman  6. What is your office phone number [phone]   7.   What is your office fax number [phone] Attn: Derl Barrow  8.   Anesthesia type (None, local, MAC, general) ? Not listed   Lucienne Minks 02/09/2020, 9:20 AM  _________________________________________________________________   (provider comments below)

## 2020-03-31 ENCOUNTER — Ambulatory Visit: Payer: Medicare Other | Admitting: Cardiovascular Disease

## 2020-03-31 IMAGING — DX PORTABLE CHEST - 1 VIEW
1 series · 1 of 1 positions shown · non-contrast
Comparison: Single-view of the chest 05/31/2018.

CLINICAL DATA: Chest pain for approximately 2 hours.

EXAM:
PORTABLE CHEST 1 VIEW

[chest ap]
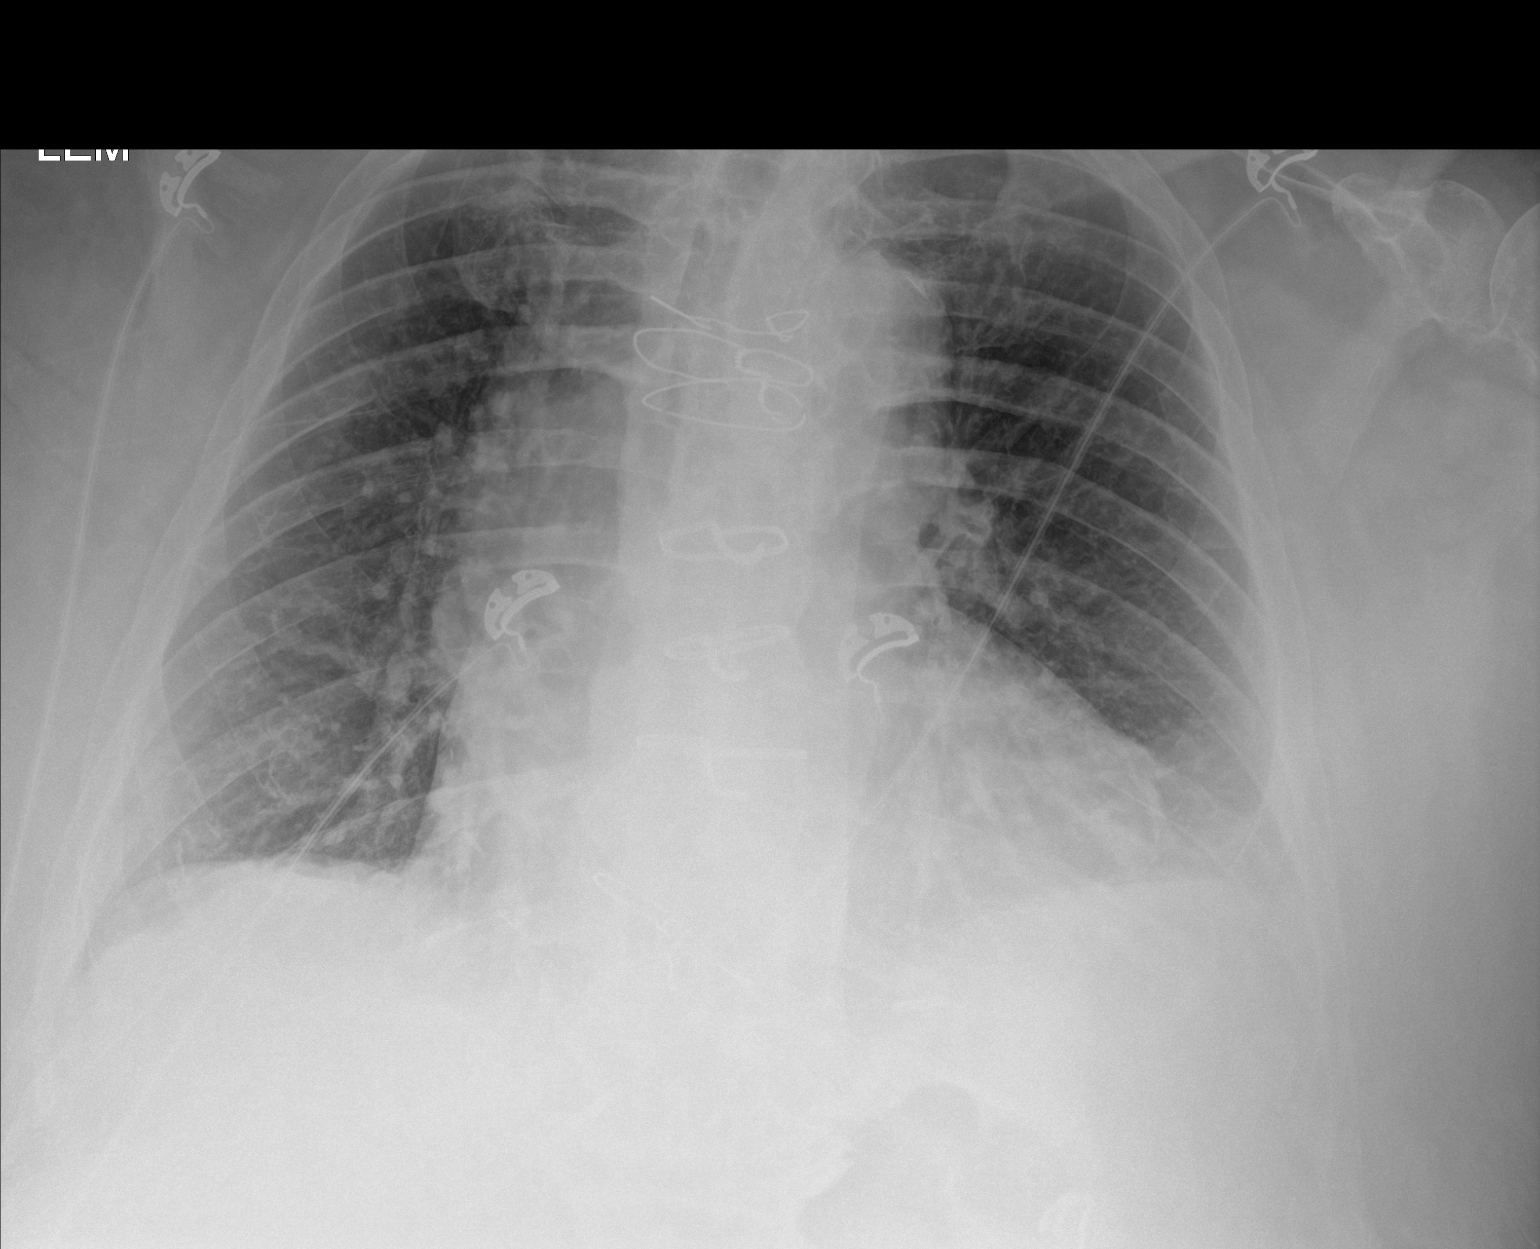

[1 of 1 positions shown; findings below may reference images not displayed]

FINDINGS: That would the patient is status post CABG. There is cardiomegaly.
Atherosclerosis noted. Lungs are clear. No pneumothorax or pleural
effusion.
IMPRESSION: No acute disease.

Cardiomegaly.

Atherosclerosis.

## 2020-04-27 ENCOUNTER — Ambulatory Visit (INDEPENDENT_AMBULATORY_CARE_PROVIDER_SITE_OTHER): Payer: Medicare Other

## 2020-04-27 ENCOUNTER — Other Ambulatory Visit: Payer: Self-pay

## 2020-04-27 DIAGNOSIS — I779 Disorder of arteries and arterioles, unspecified: Secondary | ICD-10-CM | POA: Diagnosis not present

## 2020-04-29 ENCOUNTER — Ambulatory Visit: Payer: Medicare Other | Admitting: Family

## 2020-05-03 ENCOUNTER — Telehealth: Payer: Self-pay

## 2020-05-03 DIAGNOSIS — I779 Disorder of arteries and arterioles, unspecified: Secondary | ICD-10-CM

## 2020-05-03 NOTE — Telephone Encounter (Signed)
Called to give the patient carotid dopp results. Lmtcb.

## 2020-05-03 NOTE — Telephone Encounter (Signed)
-----   Message from Wellington Hampshire, MD sent at 05/02/2020 11:28 AM EDT ----- Stable moderate carotid stenosis.  Repeat study in 1 year.

## 2020-05-05 NOTE — Telephone Encounter (Signed)
Lamar Laundry, RN  05/03/2020 10:23 AM EDT    Patient made aware of carotid dopp results with verbalized understanding   Order placed for 1 year repeat carotid dopp.

## 2020-05-12 ENCOUNTER — Telehealth: Payer: Medicare Other | Admitting: Cardiovascular Disease

## 2020-05-15 IMAGING — CT CT HEAD WITHOUT CONTRAST
4 of 8 series · 14 of 47 positions shown, 15 images · non-contrast
Comparison: MR brain, 09/18/2016

CLINICAL DATA: Fall, blood thinners

EXAM:
CT HEAD WITHOUT CONTRAST
CT CERVICAL SPINE WITHOUT CONTRAST
TECHNIQUE: Multidetector CT imaging of the head and cervical spine was
performed following the standard protocol without intravenous
contrast. Multiplanar CT image reconstructions of the cervical spine
were also generated.

[Series 3: head wo · axial · 0.41mm/px · z∈[-98,-43]mm · 2 of 34 slices shown, 3 images]
[im 12/34  brain]
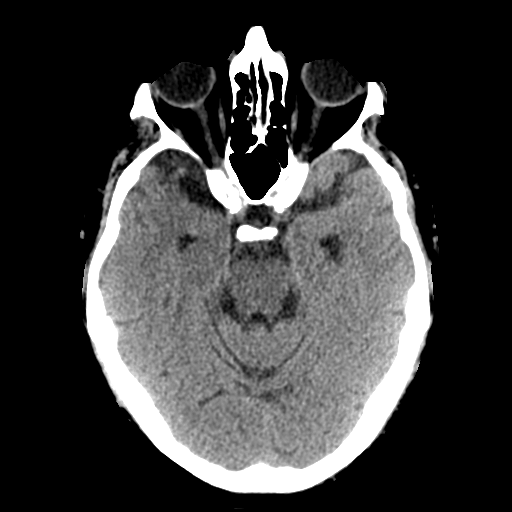
[im 12/34  bone]
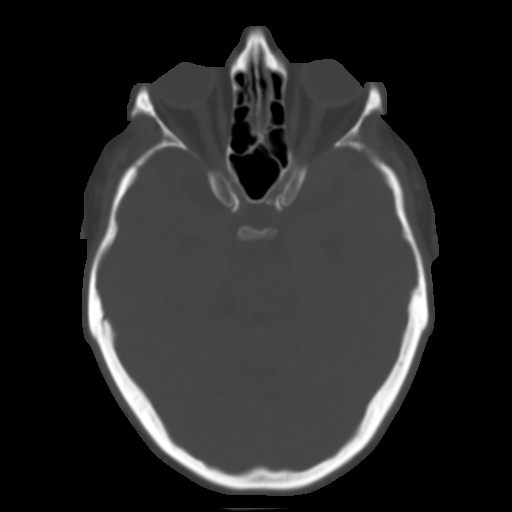
[im 23/34  brain]
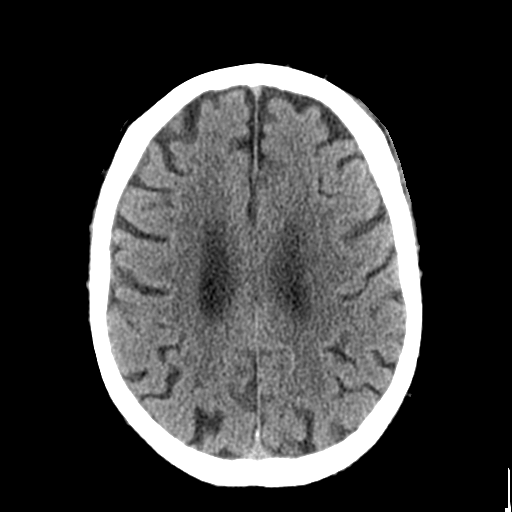

[Series 8: coronal soft tissue · coronal · 0.32mm/px · 2 of 70 slices shown]
[im 8/70  brain]
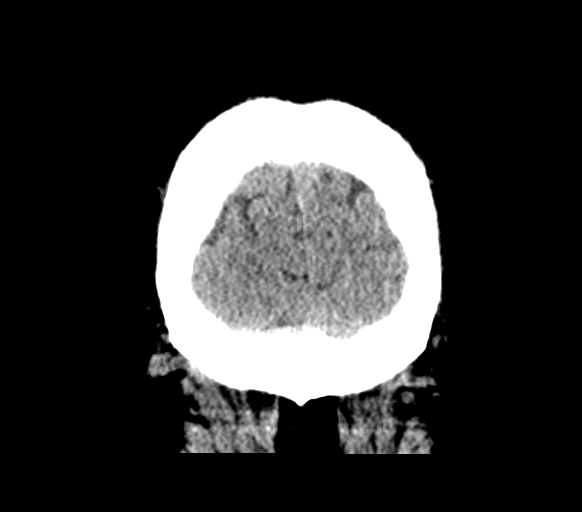
[im 16/70  brain]
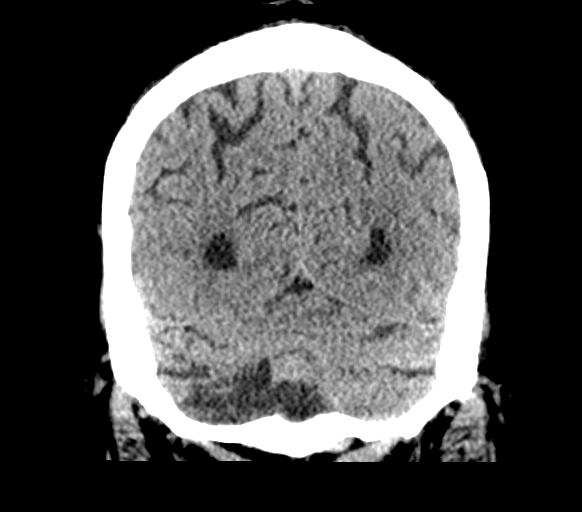

[Series 9: sagittal soft tissue · sagittal · 0.32mm/px · 2 of 58 slices shown]
[im 20/58  brain]
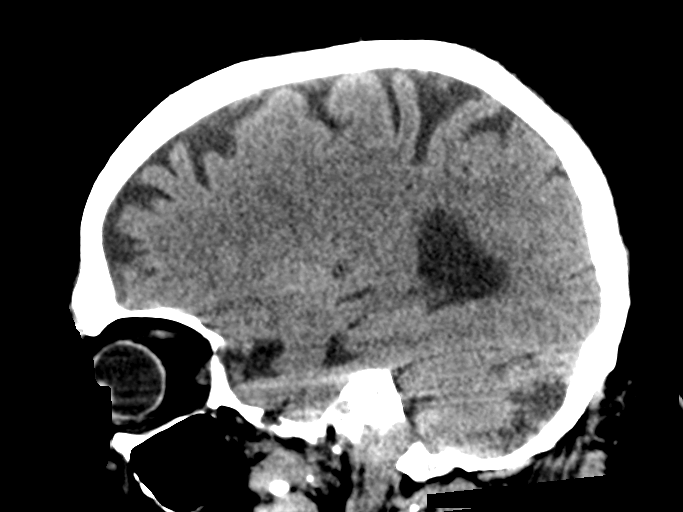
[im 39/58  brain]
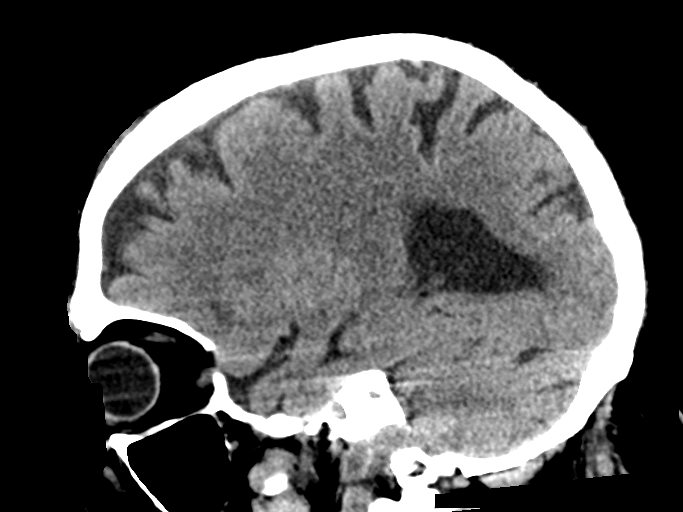

[Series 12: orthogonal bone · axial · 0.53mm/px · z∈[-332,-181]mm · 8 of 98 slices shown]
[im 10/98  bone]
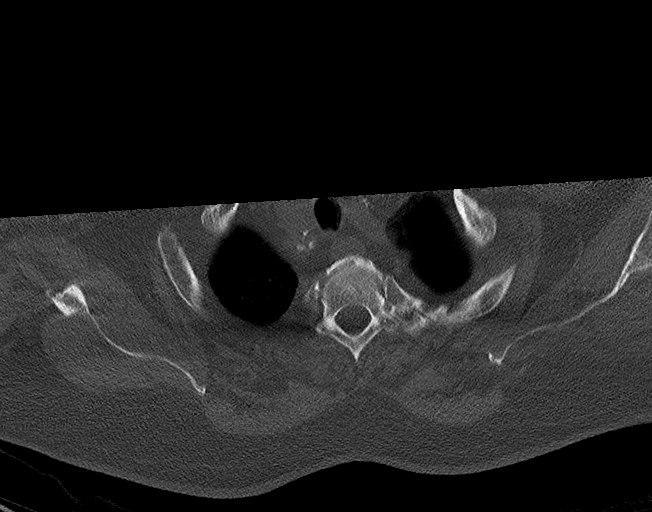
[im 20/98  bone]
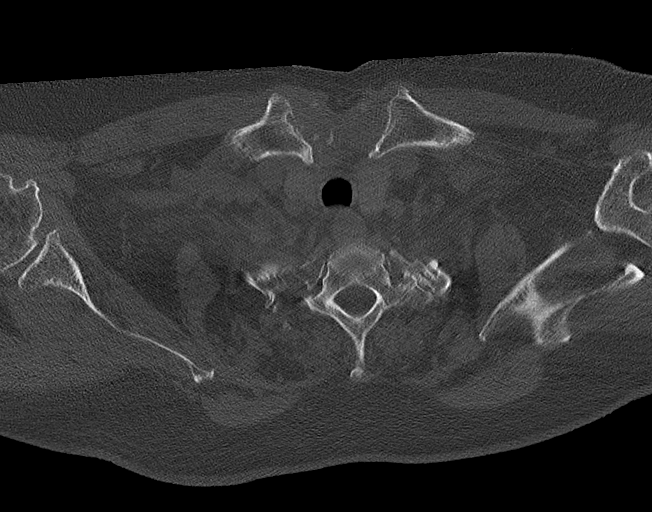
[im 30/98  bone]
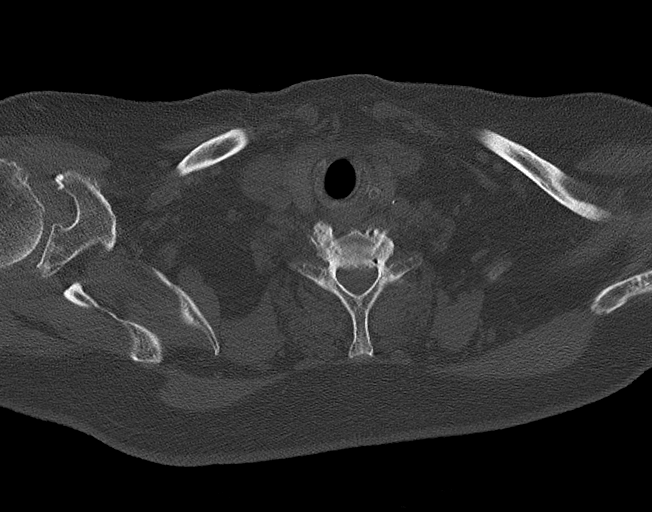
[im 39/98  bone]
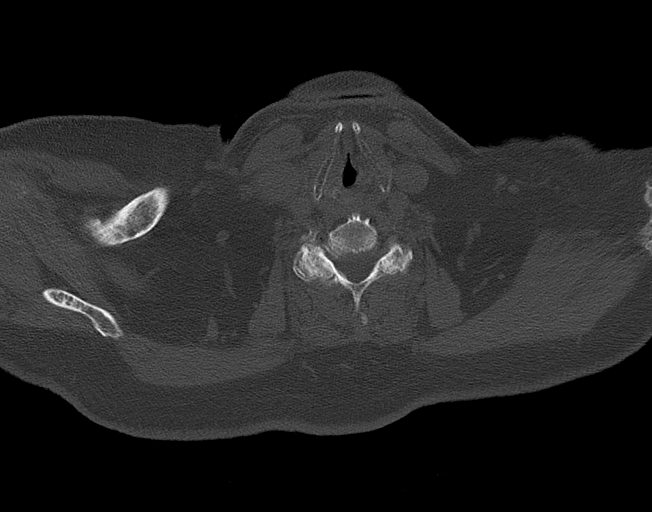
[im 59/98  bone]
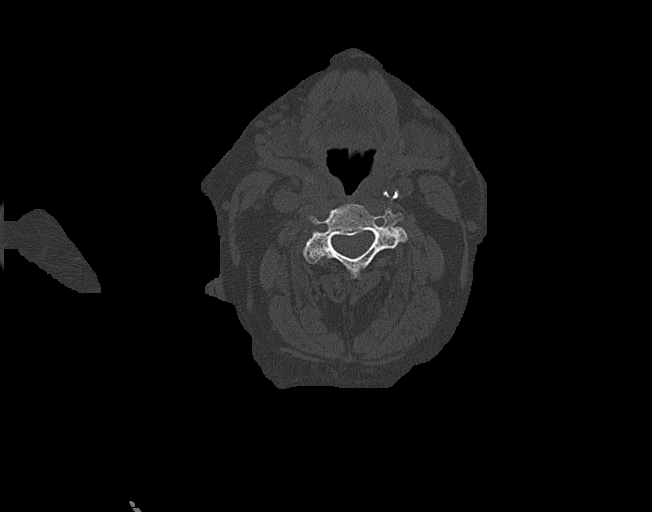
[im 68/98  bone]
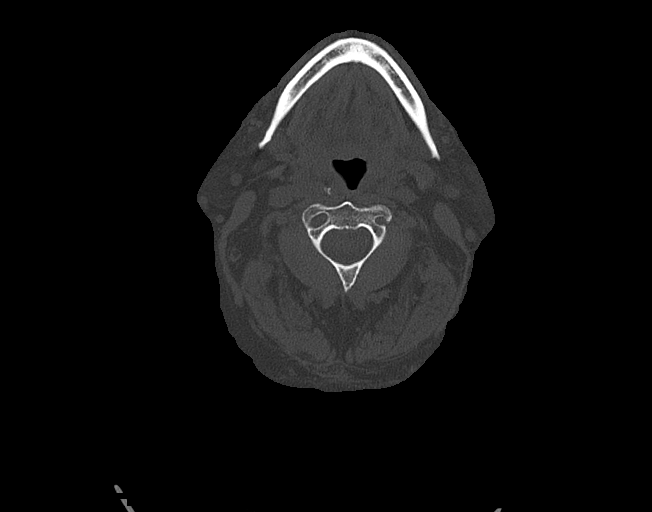
[im 78/98  bone]
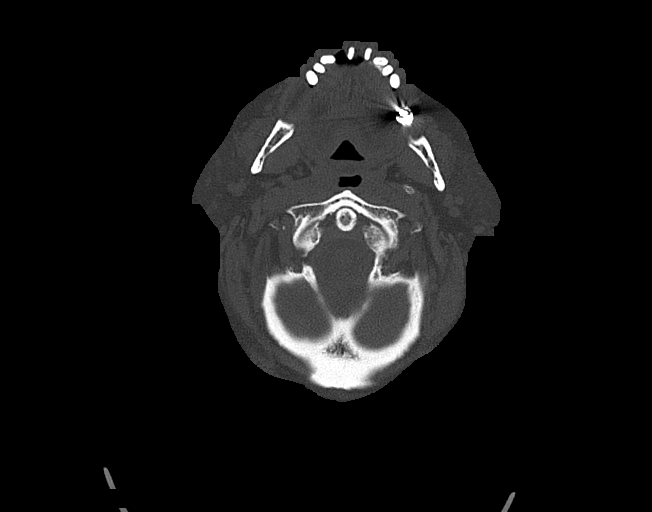
[im 88/98  bone]
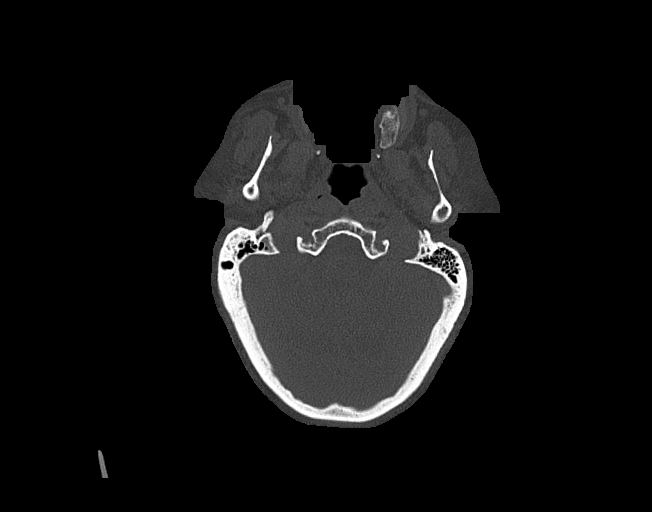

[14 of 47 positions shown; findings below may reference images not displayed]

FINDINGS: CT HEAD FINDINGS

Brain: No evidence of acute infarction, hemorrhage, hydrocephalus,
extra-axial collection or mass lesion/mass effect. Mild
periventricular white matter hypodensity. Nonacute infarction of the
right cerebral hemisphere.

Vascular: No hyperdense vessel or unexpected calcification.

Skull: Normal. Negative for fracture or focal lesion.

Sinuses/Orbits: No acute finding.

Other: Soft tissue hematoma of the left forehead.

CT CERVICAL SPINE FINDINGS

Alignment: Normal.

Skull base and vertebrae: No acute fracture. Incidental congenital
variant posterior nonunion of C1. No primary bone lesion or focal
pathologic process.

Soft tissues and spinal canal: No prevertebral fluid or swelling. No
visible canal hematoma.

Disc levels: Moderate to severe multilevel disc degenerative disease
and osteophytosis.

Upper chest: Negative.

Other: None.
IMPRESSION: 1. No acute intracranial pathology. Small-vessel white matter
disease. Nonacute infarction of the right cerebral hemisphere.

2.  Soft tissue hematoma of the left forehead.

3.  No fracture or static subluxation of the cervical spine.

## 2020-05-24 ENCOUNTER — Other Ambulatory Visit: Payer: Self-pay

## 2020-05-24 ENCOUNTER — Ambulatory Visit (INDEPENDENT_AMBULATORY_CARE_PROVIDER_SITE_OTHER): Payer: Medicare Other | Admitting: Family

## 2020-05-24 ENCOUNTER — Encounter: Payer: Self-pay | Admitting: Family

## 2020-05-24 VITALS — BP 114/56 | HR 53 | Ht 69.0 in | Wt 241.1 lb

## 2020-05-24 DIAGNOSIS — I480 Paroxysmal atrial fibrillation: Secondary | ICD-10-CM

## 2020-05-24 DIAGNOSIS — I10 Essential (primary) hypertension: Secondary | ICD-10-CM

## 2020-05-24 DIAGNOSIS — I2510 Atherosclerotic heart disease of native coronary artery without angina pectoris: Secondary | ICD-10-CM

## 2020-05-24 DIAGNOSIS — I779 Disorder of arteries and arterioles, unspecified: Secondary | ICD-10-CM

## 2020-05-24 DIAGNOSIS — I5022 Chronic systolic (congestive) heart failure: Secondary | ICD-10-CM

## 2020-05-24 MED ORDER — FUROSEMIDE 20 MG PO TABS: 20.0000 mg | ORAL_TABLET | Freq: Every day | ORAL | 3 refills | Status: DC

## 2020-05-24 NOTE — Patient Instructions (Addendum)
Medication Instructions:  Your physician recommends that you continue on your current medications as directed. Please refer to the Current Medication list given to you today.  *If you need a refill on your cardiac medications before your next appointment, please call your pharmacy*   Lab Work: Your physician recommends that you have lab work today(CMET, lipid)  If you have labs (blood work) drawn today and your tests are completely normal, you will receive your results only by: Marland Kitchen MyChart Message (if you have MyChart) OR . A paper copy in the mail If you have any lab test that is abnormal or we need to change your treatment, we will call you to review the results.   Testing/Procedures: None ordered   Follow-Up: At [facility], you and your health needs are our priority.  As part of our continuing mission to provide you with exceptional heart care, we have created designated Provider Care Teams.  These Care Teams include your primary Cardiologist (physician) and Advanced Practice Providers (APPs -  Physician Assistants and Nurse Practitioners) who all work together to provide you with the care you need, when you need it.  We recommend signing up for the patient portal called "MyChart".  Sign up information is provided on this After Visit Summary.  MyChart is used to connect with patients for Virtual Visits (Telemedicine).  Patients are able to view lab/test results, encounter notes, upcoming appointments, etc.  Non-urgent messages can be sent to your provider as well.   To learn more about what you can do with MyChart, go to [email or web address].    Your next appointment:  3-4 months. Dr Fletcher Anon preferred.

## 2020-05-24 NOTE — Progress Notes (Signed)
Office Visit    Patient Name: Jeff Henderson Date of Encounter: 05/24/2020  Primary Care Provider:  Nelwyn Salisbury, PA-C Primary Cardiologist:  [name], MD Electrophysiologist:  None   Chief Complaint    Jeff Henderson is a 79 y.o. male with a hx of CAD s/p CABG 1995, HTN, HLD, CKD 3, gout, BPH, chronic systolic heart failure, PAF presents today for follow-up of CAD and heart failure  Past Medical History    Past Medical History:  Diagnosis Date  . Arthritis   . BPH (benign prostatic hyperplasia)   . CAD (coronary artery disease)    a. 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 06/2018 Cath: Native 3VD, 2/3 patent grafts (VG->OM2 100)->Med rx; c. 05/2019 NSTEMI/Cath:  LM nl, LAD 99ost, 128m LCX 1033mRCA 100p, LIMA->LAD ok, VG->RPDA ok (? jump to OM), VG->OM not visualized (prev occluded). LVEDP 3068m-->Med Rx.  . CKD (chronic kidney disease), stage III   . Gout   . Gouty arthropathy, chronic, with tophi   . Hernia, abdominal   . HFrEF (heart failure with reduced ejection fraction) (HCCIona  a. 06/2018 Echo: EF 45-50%, mild MR; b. 05/2019 Echo: EF 30-35%, diff HK.  . HMarland Kitchenperlipidemia LDL goal <70   . Hypertension   . Ischemic cardiomyopathy    a. 06/2018 Echo: EF 45-50%; b. 05/2019 Echo: EF 30-35%, diff HK.  . MMarland KitchenUS (monoclonal gammopathy of unknown significance)   . [place] ([place]oxysmal atrial fibrillation) ([place]  a. Dx 05/2019-->CHA2DS2VASc = 5-->Eliquis.  . Renal cyst    Past Surgical History:  Procedure Laterality Date  . CARDIAC CATHETERIZATION    . carpel tunnel    . COLONOSCOPY WITH PROPOFOL N/A 01/14/2020   Procedure: COLONOSCOPY WITH PROPOFOL;  Surgeon: Toledo, TeoBenay PikeD;  Location: [facility] ENDOSCOPY;  Service: Gastroenterology;  Laterality: N/A;  . CORONARY ARTERY BYPASS GRAFT    . ESOPHAGOGASTRODUODENOSCOPY (EGD) WITH PROPOFOL N/A 01/14/2020   Procedure: ESOPHAGOGASTRODUODENOSCOPY (EGD) WITH PROPOFOL;  Surgeon: Toledo,  TeoBenay PikeD;  Location: [facility] ENDOSCOPY;  Service: Gastroenterology;  Laterality: N/A;  . JOINT REPLACEMENT Bilateral 2002  2011  . LEFT HEART CATH AND CORONARY ANGIOGRAPHY N/A 06/03/2018   Procedure: LEFT HEART CATH AND CORS/GRAFTS ANGIOGRAPHY;  Surgeon: ParIsaias CowmanD;  [facility];  Service: Cardiovascular;  Laterality: N/A;  . LEFT HEART CATH AND CORS/GRAFTS ANGIOGRAPHY Bilateral 05/07/2019   Procedure: LEFT HEART CATH AND CORS/GRAFTS ANGIOGRAPHY;  Surgeon: GolMinna MerrittsD;  [facility];  Service: Cardiovascular;  Laterality: Bilateral;  . REPLACEMENT TOTAL KNEE    . VEIN BYPASS SURGERY      Allergies  Allergies  Allergen Reactions  . Penicillins Rash and Other (See Comments)    Has patient had a PCN reaction causing immediate rash, facial/tongue/throat swelling, SOB or lightheadedness with hypotension: No Has patient had a PCN reaction causing severe rash involving mucus membranes or skin necrosis: No Has patient had a PCN reaction that required hospitalization: No Has patient had a PCN reaction occurring within the last 10 years: No If all of the above answers are "NO", then may proceed with Cephalosporin use.     History of Present Illness    Jeff Henderson a [age]o. male with a hx of CAD s/p CABG 1995, HTN, HLD, CKD 3, MGUS, gout, BPH, chronic systolic heart failure, PAF last seen 11/2019 by Dr. AriFletcher Anona telemedicine.  CAD s/p CABG 1995.  He has history of left subclavian stenosis reportedly up to 70% during cardiac catheterization in [place] [facility] in 2011.  He is admitted to [facility] 05/2017 with NSTEMI with catheterization showing severe three-vessel coronary artery disease including CTO of proximal RCA and mid LAD.  Distal LCx and grafted OM appeared chronically occluded.  99% ostial LAD stenosis, supplying 2 small diagonal branches.  LIMA to LAD patent as well as SVG to RCA.  There is suggestion of stenosis in the proximal left  subclavian artery though not well visualized.  LVEF mildly reduced at 45 to 60% during the admission.  Hospitalized June 2010 with chest pain in setting of newly diagnosed atrial fibrillation with RVR.  Continue sinus rhythm on exam.  Underwent IV cardiac catheterization showing no significant change.  He was placed on anticoagulation with Eliquis.  Echo with LVEF 30-35%.  Very pleasant gentleman who reports for follow-up.  Enjoyed staying very active as he goes to the gym daily and also uses his treadmill at home.   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 He has had an injection previously which has "worn off" - lasted about 6 or 7 months.  Sees Dr. Radene Knee of nephrology.   Reports no chest pain, pressure, tightness.  Reports no shortness of breath at rest nor dyspnea on exertion.  Does endorse the pain of his neck sometimes makes him feel short of breath that this is a different sensation.  No lightheadedness, dizziness, near-syncope, syncope.  EKGs/Labs/Other Studies Reviewed:   The following studies were reviewed today:  Carotid duplex 04/27/20 Summary:  Right Carotid: Velocities in the right ICA are consistent with a 60-79%                 stenosis. Non-hemodynamically significant plaque <50% noted  in    the CCA. The ECA appears >50% stenosed.   Left Carotid: Velocities in the left ICA are consistent with a 40-59%  stenosis.               Non-hemodynamically significant plaque <50% noted in the  CCA. The                ECA appears <50% stenosed.   Vertebrals:  Bilateral vertebral arteries demonstrate antegrade flow.  Subclavians: Right subclavian artery was stenotic. Normal flow  hemodynamics were               seen in the left subclavian artery.   Echo 12/2019  1. Left ventricular ejection fraction, by visual estimation, is 35 to  40%. The left ventricle has mild to moderately decreased  function. There  is no left ventricular hypertrophy.   2. Left ventricular diastolic parameters are consistent with Grade I  diastolic dysfunction (impaired relaxation).   3. Mildly dilated left ventricular internal cavity size.   4. The left ventricle demonstrates global hypokinesis.   5. Global right ventricle has normal systolic function.The right  ventricular size is normal. No increase in right ventricular wall  thickness.   6. Left atrial size was mild-moderately dilated.   7. TR signal is inadequate for assessing pulmonary artery systolic  pressure.   EKG:  EKG is ordered today.  The ekg ordered today demonstrates sinus bradycardia 53 bpm with no acute ST/T wave changes  Recent Labs: 06/02/2019: TSH 2.691 11/30/2019: ALT 29; BUN 40; Creatinine, Ser 1.95; Hemoglobin 11.9; Platelets 177; Potassium 4.5; Sodium 139  Recent Lipid  Panel No results found for: CHOL, TRIG, HDL, CHOLHDL, VLDL, LDLCALC, LDLDIRECT  Home Medications   Current Meds  Medication Sig  . amLODipine-benazepril (LOTREL) 10-40 MG capsule Take 1 capsule by mouth daily.   Marland Kitchen apixaban (ELIQUIS) 2.5 MG TABS tablet Take 1 tablet (2.5 mg total) by mouth 2 (two) times daily.  Marland Kitchen atorvastatin (LIPITOR) 80 MG tablet Take 80 mg by mouth daily.   . carvedilol (COREG) 25 MG tablet Take 1 tablet (25 mg total) by mouth 2 (two) times daily with a meal.  . Coenzyme Q10 (COQ10) 100 MG CAPS Take 100 mg by mouth daily.  . famotidine (PEPCID) 20 MG tablet Take 20 mg by mouth 2 (two) times daily.  . fluticasone (FLONASE) 50 MCG/ACT nasal spray Place 1 spray into both nostrils 2 (two) times a day.  . furosemide (LASIX) 40 MG tablet Take 0.5 tablets (20 mg total) by mouth daily.  . isosorbide mononitrate (IMDUR) 60 MG 24 hr tablet Take 1 tablet (60 mg total) by mouth daily.  . Multiple Vitamins-Minerals (MENS 50+ MULTI VITAMIN/MIN) TABS Take 1 tablet by mouth daily.   . nitroGLYCERIN (NITROSTAT) 0.4 MG SL tablet Place 1 tablet (0.4 mg  total) under the tongue every 5 (five) minutes as needed for chest pain (Nitro if taking third dose, call 911).  . Omega-3 1000 MG CAPS Take 1,000 mg by mouth daily.       Review of Systems       Review of Systems  Constitutional: Negative for chills, fever and malaise/fatigue.  Cardiovascular: Negative for chest pain, dyspnea on exertion, irregular heartbeat, leg swelling, near-syncope, orthopnea, palpitations and syncope.  Respiratory: Negative for cough, shortness of breath and wheezing.   Gastrointestinal: Negative for melena, nausea and vomiting.  Genitourinary: Negative for hematuria.  Neurological: Negative for dizziness, light-headedness and weakness.   All other systems reviewed and are otherwise negative except as noted above.  Physical Exam    VS:  BP (!) 114/56 (BP Location: Left Arm, Patient Position: Sitting, Cuff Size: Normal)   Pulse (!) 53   Ht 5' 9"  (1.753 m)   Wt 241 lb 2 oz (109.4 kg)   SpO2 98%   BMI 35.61 kg/m  , BMI Body mass index is 35.61 kg/m. GEN: Well nourished, overweight, well developed, in no acute distress. HEENT: normal. Neck: Supple, no JVD, carotid bruits, or masses. Cardiac: RRR, no murmurs, rubs, or gallops. No clubbing, cyanosis, edema.  Radials/DP/PT 2+ and equal bilaterally.  Respiratory:  Respirations regular and unlabored, clear to auscultation bilaterally. GI: Soft, nontender, nondistended, BS + x 4. MS: No deformity or atrophy. Skin: Warm and dry, no rash. Neuro:  Strength and sensation are intact. Psych: Normal affect.  Assessment & Plan    1. CAD -stable no anginal symptoms.  EKG today with no acute ST/T wave changes.  No EKG for ischemic evaluation this time.  GDMT includes beta-blocker, statin, Imdur, as needed nitroglycerin.  2. Moderate bilateral carotid disease -duplex 04/2020 with stable moderate carotid artery stenosis.  Right ICA 60 to 79% stenosis, left ICA 40 to 69% stenosis.  Repeat study for 1 year has been ordered.   Continue statin.  No aspirin secondary to anticoagulation  3. HLD -LDL goal less than 70.  Continue atorvastatin 80 mg daily.  Lipid panel today for monitoring.  4. HTN -BP well controlled.  Continue present antihypertensive regimen.  5. HFrEF -euvolemic and well compensated on exam.  NYHA I.  Echo 05/07/2019 LVEF 30-35%.  Echo  12/2019 LVEF 35-40%.  Present GDMT includes Coreg, ACE, Lasix.  Further up titration of heart failure therapies limited by CKD.  CMP today for consideration of transition to [facility].  6. CKD -careful titration of diuretics and hypertensives.  Follows with nephrology.  7. PAF - Maintaining normal sinus rhythm.  Denies bleeding complications.  Reduced dose Eliquis 2.5 mg twice daily due to age, creatinine.  CHA2DS2-VASc of at least 5 (Hx 2, HTN, HF, CAD)  Disposition: Follow up in 3 month(s) with Dr. Tor Netters, NP 05/24/2020, 10:00 AM

## 2020-05-25 ENCOUNTER — Telehealth: Payer: Self-pay | Admitting: *Deleted

## 2020-05-25 DIAGNOSIS — E785 Hyperlipidemia, unspecified: Secondary | ICD-10-CM

## 2020-05-25 DIAGNOSIS — Z79899 Other long term (current) drug therapy: Secondary | ICD-10-CM

## 2020-05-25 LAB — COMPREHENSIVE METABOLIC PANEL
ALT: 18 IU/L (ref 0–44)
AST: 22 IU/L (ref 0–40)
Albumin/Globulin Ratio: 1.5 (ref 1.2–2.2)
Albumin: 4.1 g/dL (ref 3.7–4.7)
Alkaline Phosphatase: 55 IU/L (ref 48–121)
BUN/Creatinine Ratio: 17 (ref 10–24)
BUN: 32 mg/dL — ABNORMAL HIGH (ref 8–27)
Bilirubin Total: 0.4 mg/dL (ref 0.0–1.2)
CO2: 21 mmol/L (ref 20–29)
Calcium: 9.3 mg/dL (ref 8.6–10.2)
Chloride: 107 mmol/L — ABNORMAL HIGH (ref 96–106)
Creatinine, Ser: 1.84 mg/dL — ABNORMAL HIGH (ref 0.76–1.27)
GFR calc Af Amer: 39 mL/min/{1.73_m2} — ABNORMAL LOW (ref >59)
GFR calc non Af Amer: 34 mL/min/{1.73_m2} — ABNORMAL LOW (ref >59)
Globulin, Total: 2.7 g/dL (ref 1.5–4.5)
Glucose: 103 mg/dL — ABNORMAL HIGH (ref 65–99)
Potassium: 5 mmol/L (ref 3.5–5.2)
Sodium: 142 mmol/L (ref 134–144)
Total Protein: 6.8 g/dL (ref 6.0–8.5)

## 2020-05-25 LAB — LIPID PANEL
Chol/HDL Ratio: 5.9 ratio — ABNORMAL HIGH (ref 0.0–5.0)
Cholesterol, Total: 178 mg/dL (ref 100–199)
HDL: 30 mg/dL — ABNORMAL LOW (ref >39)
LDL Chol Calc (NIH): 130 mg/dL — ABNORMAL HIGH (ref 0–99)
Triglycerides: 98 mg/dL (ref 0–149)
VLDL Cholesterol Cal: 18 mg/dL (ref 5–40)

## 2020-05-25 NOTE — Telephone Encounter (Signed)
Left voicemail message to call back for results and recommendations. 

## 2020-05-25 NOTE — Telephone Encounter (Signed)
-----   Message from Loel Dubonnet, NP sent at 05/25/2020  7:44 AM EDT ----- Kidney function and electrolytes stable. Normal liver function. Lipid panel shows LDL is above goal of 70 at 130. Please confirm he is taking  Atorvastatin 52m daily. If not, resume with repeat labs in 6 weeks. If he is taking, continue and add Zetia 12mdaily and repeat lipid/liver in 6 weeks.

## 2020-05-25 NOTE — Telephone Encounter (Signed)
Patient returning my call about lab results. He mentioned that he ran out of one medication and was not sure if it was that one or not. Offered to send in refill but he wanted to wait until he could see which one it was. He states that he will call me back with that information so we can determine if additional medication and/or labs are needed. He will return call once he gets home.

## 2020-05-26 ENCOUNTER — Telehealth: Payer: Self-pay | Admitting: Cardiovascular Disease

## 2020-05-26 NOTE — Telephone Encounter (Signed)
Clinical pharmacist to review Eliquis

## 2020-05-26 NOTE — Telephone Encounter (Signed)
° °  [facility] Pre-operative Risk Assessment    HEARTCARE STAFF: - Please ensure there is not already an duplicate clearance open for this procedure. - Under Visit Info/Reason for Call, type in Other and utilize the format Clearance MM/DD/YY or Clearance TBD. Do not use dashes or single digits. - If request is for dental extraction, please clarify the # of teeth to be extracted.  Request for surgical clearance:  1. What type of surgery is being performed? Epidural steroid injection   2. When is this surgery scheduled? TBD   3. What type of clearance is required (medical clearance vs. Pharmacy clearance to hold med vs. Both)? both  4. Are there any medications that need to be held prior to surgery and how long? Hold Eliquis 3 days prior   5. Practice name and name of physician performing surgery? [facility] Dr Madolyn Frieze   6. What is the office phone number? [phone]   7.   What is the office fax number? [phone]  8.   Anesthesia type (None, local, MAC, general) ? Not listed    Jeff Henderson 05/26/2020, 2:43 PM  _________________________________________________________________   (provider comments below)

## 2020-05-27 NOTE — Telephone Encounter (Signed)
Left a detailed voice message for the patient to hold Eliquis for 3 days prior to the procedure and restart as soon as possible after the procedure at the discretion of the surgeon and for him to give our office a call if he has any questions.

## 2020-05-27 NOTE — Telephone Encounter (Signed)
   Primary Cardiologist: [name], MD  Chart reviewed as part of pre-operative protocol coverage. Given past medical history and time since last visit, based on ACC/AHA guidelines, Jeff Henderson would be at acceptable risk for the planned procedure without further cardiovascular testing.   Patient was recently seen by cardiology service at which time he was stable without any ischemic symptom.   I will route this recommendation to the requesting party via Epic fax function and remove from pre-op pool.  Please call with questions.   Callback pool to inform the patient to hold Eliquis for 3 days prior to the procedure and restart as soon as possible after the procedure at the discretion of the surgeon.  [place], [place] 05/27/2020, 4:12 PM

## 2020-05-27 NOTE — Telephone Encounter (Signed)
Patient with diagnosis of PAF on Eliquis for anticoagulation.    Procedure: Epidural steroid injection Date of procedure: TBD  CHADS2-VASc score of  5 (CHF, HTN, AGE, CAD, AGE)  CrCl 39 ml/min  Per office protocol, patient can hold Eliquis for 3 days prior to procedure.

## 2020-05-30 ENCOUNTER — Inpatient Hospital Stay: Payer: Medicare Other

## 2020-06-01 ENCOUNTER — Telehealth: Payer: Self-pay | Admitting: Cardiovascular Disease

## 2020-06-01 NOTE — Telephone Encounter (Signed)
No answer. Left message to call back if he still needs the refill.

## 2020-06-01 NOTE — Telephone Encounter (Signed)
Please call to discuss clearance. Patient states that his doctor that is performing his procedure has not received the clearance . Fax number for clearance is  [phone]

## 2020-06-01 NOTE — Telephone Encounter (Signed)
See previous message for cardiac clearance  Returned call to pt he states that he called requesting surgeons off to make sure "everything is ok". They told him that they have not received cardiac clearance from [place]. I see per lase message that this was sent 05-27-20 will resend to requesting providers office.  Faxed again to [place]. Verified receipt with Kathlee Nations @ Dr Ricky Stabs office.

## 2020-06-02 ENCOUNTER — Encounter: Payer: Self-pay | Admitting: *Deleted

## 2020-06-02 MED ORDER — EZETIMIBE 10 MG PO TABS: 10.0000 mg | ORAL_TABLET | Freq: Every day | ORAL | 1 refills | Status: DC

## 2020-06-02 NOTE — Telephone Encounter (Signed)
Spoke to patient. He has been taking the atorvastatin 80 mg daily and does not need a refill. He is agreeable to start Zetia 10 mg daily and get repeat fasting labs in 6 weeks.  I will mail a letter with the lab work instructions to patient now.  Rx sent to pharmacy and lab orders entered.

## 2020-06-07 ENCOUNTER — Inpatient Hospital Stay: Payer: Medicare Other | Attending: Hematology and Oncology

## 2020-06-07 ENCOUNTER — Telehealth: Payer: Self-pay

## 2020-06-07 ENCOUNTER — Other Ambulatory Visit: Payer: Self-pay

## 2020-06-07 DIAGNOSIS — R768 Other specified abnormal immunological findings in serum: Secondary | ICD-10-CM

## 2020-06-07 DIAGNOSIS — D631 Anemia in chronic kidney disease: Secondary | ICD-10-CM | POA: Insufficient documentation

## 2020-06-07 DIAGNOSIS — N183 Chronic kidney disease, stage 3 unspecified: Secondary | ICD-10-CM | POA: Insufficient documentation

## 2020-06-07 LAB — CBC WITH DIFFERENTIAL/PLATELET
Abs Immature Granulocytes: 0.03 10*3/uL (ref 0.00–0.07)
Basophils Absolute: 0.1 10*3/uL (ref 0.0–0.1)
Basophils Relative: 1 %
Eosinophils Absolute: 0.5 10*3/uL (ref 0.0–0.5)
Eosinophils Relative: 6 %
HCT: 34.2 % — ABNORMAL LOW (ref 39.0–52.0)
Hemoglobin: 11.4 g/dL — ABNORMAL LOW (ref 13.0–17.0)
Immature Granulocytes: 0 %
Lymphocytes Relative: 14 %
Lymphs Abs: 1.1 10*3/uL (ref 0.7–4.0)
MCH: 31.1 pg (ref 26.0–34.0)
MCHC: 33.3 g/dL (ref 30.0–36.0)
MCV: 93.2 fL (ref 80.0–100.0)
Monocytes Absolute: 0.7 10*3/uL (ref 0.1–1.0)
Monocytes Relative: 8 %
Neutro Abs: 5.9 10*3/uL (ref 1.7–7.7)
Neutrophils Relative %: 71 %
Platelets: 206 10*3/uL (ref 150–400)
RBC: 3.67 MIL/uL — ABNORMAL LOW (ref 4.22–5.81)
RDW: 13.5 % (ref 11.5–15.5)
WBC: 8.3 10*3/uL (ref 4.0–10.5)
nRBC: 0 % (ref 0.0–0.2)

## 2020-06-07 LAB — COMPREHENSIVE METABOLIC PANEL
ALT: 20 U/L (ref 0–44)
AST: 30 U/L (ref 15–41)
Albumin: 3.8 g/dL (ref 3.5–5.0)
Alkaline Phosphatase: 43 U/L (ref 38–126)
Anion gap: 9 (ref 5–15)
BUN: 48 mg/dL — ABNORMAL HIGH (ref 8–23)
CO2: 24 mmol/L (ref 22–32)
Calcium: 8.9 mg/dL (ref 8.9–10.3)
Chloride: 108 mmol/L (ref 98–111)
Creatinine, Ser: 2.05 mg/dL — ABNORMAL HIGH (ref 0.61–1.24)
GFR calc Af Amer: 34 mL/min — ABNORMAL LOW (ref >60)
GFR calc non Af Amer: 30 mL/min — ABNORMAL LOW (ref >60)
Glucose, Bld: 112 mg/dL — ABNORMAL HIGH (ref 70–99)
Potassium: 4.6 mmol/L (ref 3.5–5.1)
Sodium: 141 mmol/L (ref 135–145)
Total Bilirubin: 0.6 mg/dL (ref 0.3–1.2)
Total Protein: 7.3 g/dL (ref 6.5–8.1)

## 2020-06-07 NOTE — Telephone Encounter (Signed)
The patient labs has been routed to the patient pcp

## 2020-06-08 ENCOUNTER — Other Ambulatory Visit: Payer: Self-pay | Admitting: Cardiovascular Disease

## 2020-06-08 LAB — PROTEIN ELECTROPHORESIS, SERUM
A/G Ratio: 1.2 (ref 0.7–1.7)
Albumin ELP: 3.6 g/dL (ref 2.9–4.4)
Alpha-1-Globulin: 0.3 g/dL (ref 0.0–0.4)
Alpha-2-Globulin: 0.9 g/dL (ref 0.4–1.0)
Beta Globulin: 0.9 g/dL (ref 0.7–1.3)
Gamma Globulin: 0.9 g/dL (ref 0.4–1.8)
Globulin, Total: 3 g/dL (ref 2.2–3.9)
Total Protein ELP: 6.6 g/dL (ref 6.0–8.5)

## 2020-06-08 LAB — KAPPA/LAMBDA LIGHT CHAINS
Kappa free light chain: 57.6 mg/L — ABNORMAL HIGH (ref 3.3–19.4)
Kappa, lambda light chain ratio: 2.19 — ABNORMAL HIGH (ref 0.26–1.65)
Lambda free light chains: 26.3 mg/L (ref 5.7–26.3)

## 2020-07-11 ENCOUNTER — Other Ambulatory Visit
Admission: RE | Admit: 2020-07-11 | Discharge: 2020-07-11 | Disposition: A | Discharge status: Home / Self Care | Payer: Medicare Other | Attending: Family | Admitting: Family

## 2020-07-11 ENCOUNTER — Other Ambulatory Visit: Payer: Self-pay

## 2020-07-11 DIAGNOSIS — E785 Hyperlipidemia, unspecified: Secondary | ICD-10-CM | POA: Insufficient documentation

## 2020-07-11 DIAGNOSIS — Z79899 Other long term (current) drug therapy: Secondary | ICD-10-CM | POA: Diagnosis present

## 2020-07-11 LAB — LIPID PANEL
Cholesterol: 90 mg/dL (ref 0–200)
HDL: 31 mg/dL — ABNORMAL LOW (ref >40)
LDL Cholesterol: 49 mg/dL (ref 0–99)
Total CHOL/HDL Ratio: 2.9 RATIO
Triglycerides: 51 mg/dL (ref <150)
VLDL: 10 mg/dL (ref 0–40)

## 2020-07-11 LAB — HEPATIC FUNCTION PANEL
ALT: 20 U/L (ref 0–44)
AST: 21 U/L (ref 15–41)
Albumin: 4.1 g/dL (ref 3.5–5.0)
Alkaline Phosphatase: 41 U/L (ref 38–126)
Bilirubin, Direct: 0.1 mg/dL (ref 0.0–0.2)
Indirect Bilirubin: 0.9 mg/dL (ref 0.3–0.9)
Total Bilirubin: 1 mg/dL (ref 0.3–1.2)
Total Protein: 7.2 g/dL (ref 6.5–8.1)

## 2020-08-29 ENCOUNTER — Other Ambulatory Visit: Payer: Self-pay | Admitting: Cardiovascular Disease

## 2020-09-01 ENCOUNTER — Ambulatory Visit (INDEPENDENT_AMBULATORY_CARE_PROVIDER_SITE_OTHER): Payer: Medicare Other | Admitting: Cardiovascular Disease

## 2020-09-01 ENCOUNTER — Other Ambulatory Visit: Payer: Self-pay

## 2020-09-01 ENCOUNTER — Encounter: Payer: Self-pay | Admitting: Cardiovascular Disease

## 2020-09-01 VITALS — BP 140/60 | HR 70 | Ht 69.0 in | Wt 240.0 lb

## 2020-09-01 DIAGNOSIS — E785 Hyperlipidemia, unspecified: Secondary | ICD-10-CM | POA: Diagnosis not present

## 2020-09-01 DIAGNOSIS — I2510 Atherosclerotic heart disease of native coronary artery without angina pectoris: Secondary | ICD-10-CM

## 2020-09-01 DIAGNOSIS — I10 Essential (primary) hypertension: Secondary | ICD-10-CM | POA: Diagnosis not present

## 2020-09-01 DIAGNOSIS — I779 Disorder of arteries and arterioles, unspecified: Secondary | ICD-10-CM

## 2020-09-01 NOTE — Patient Instructions (Signed)
Medication Instructions:  Your physician recommends that you continue on your current medications as directed. Please refer to the Current Medication list given to you today.  *If you need a refill on your cardiac medications before your next appointment, please call your pharmacy*   Lab Work: None ordered If you have labs (blood work) drawn today and your tests are completely normal, you will receive your results only by: Marland Kitchen MyChart Message (if you have MyChart) OR . A paper copy in the mail If you have any lab test that is abnormal or we need to change your treatment, we will call you to review the results.   Testing/Procedures: None ordered   Follow-Up: At [facility], you and your health needs are our priority.  As part of our continuing mission to provide you with exceptional heart care, we have created designated Provider Care Teams.  These Care Teams include your primary Cardiologist (physician) and Advanced Practice Providers (APPs -  Physician Assistants and Nurse Practitioners) who all work together to provide you with the care you need, when you need it.  We recommend signing up for the patient portal called "MyChart".  Sign up information is provided on this After Visit Summary.  MyChart is used to connect with patients for Virtual Visits (Telemedicine).  Patients are able to view lab/test results, encounter notes, upcoming appointments, etc.  Non-urgent messages can be sent to your provider as well.   To learn more about what you can do with MyChart, go to [email or web address].    Your next appointment:   6 month(s)  The format for your next appointment:   In Person  Provider:   You may see [name], MD or one of the following Advanced Practice Providers on your designated Care Team:    Murray Hodgkins, NP  Christell Faith, PA-C  Marrianne Mood, PA-C  [name], [place]    Other Instructions N/A

## 2020-09-01 NOTE — Progress Notes (Signed)
Cardiology Office Note   Date:  09/01/2020   ID:  Cole, Eastridge 08/22/1941, MRN [id number]  PCP:  Nelwyn Salisbury, PA-C  Cardiologist:   [name], MD   Chief Complaint  Patient presents with   other    3-4 month follow up. Pt. c/o BP being decreased at times.       History of Present Illness: Jeff Henderson is a 79 y.o. male who presents for a follow-up visit regarding coronary artery disease, chronic systolic heart failure and paroxysmal atrial fibrillation.   He has a history of coronary artery disease status post CABG (1995), left subclavian stenosis (reportedly up to 70% during cardiac catheterization at [facility] (2011), hypertension, hyperlipidemia, chronic kidney disease stage III, gout, and BPH.  He was admitted to [facility] in late 05/2018 with non-ST elevation myocardial infarction. Catheterization by Dr. Saralyn Pilar (angiograms personally reviewed), showed severe three-vessel coronary artery disease including chronic total occlusions of the proximal RCA and mid LAD.  Distal LCx and grafted OM appeared chronically occluded.  There was a 99% ostial LAD stenosis, supplying 2 small diagonal branches.  LIMA to LAD was patent as well as SVG to RCA.  There was suggestion of stenosis in the proximal left subclavian artery, though it was not well visualized.   LVEF was mildly reduced at 45 to 50% on echo during that admission.    He is known to have left subclavian artery stenosis that there was evaluated by an angiogram at [facility] in 2011.  It showed that the stenosis was 40% at that time and a tortuous segment.  No intervention was performed.  He denies left arm claudication.  The patient has chronic kidney disease with difficult to control blood pressure.   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  Carotid Doppler in March 2020 showed moderate bilateral carotid disease with no evidence of significant stenosis affecting the left subclavian artery.  Renal artery duplex showed no renal artery  stenosis.  He was hospitalized in June 2020 with chest pain in the setting of newly diagnosed atrial fibrillation with RVR.  He converted to sinus rhythm on his own.   He underwent repeat cardiac catheterization which showed no significant change in anatomy.  The patient was placed on anticoagulation with Eliquis.  He had an echocardiogram done which showed an EF of 30 to 35%.  He has been doing reasonably well with stable exertional dyspnea.  No chest pain.  No side effects with medications.  He was taking Lotrel 10/40 mg daily but had episodes of hypotension recently and thus the dose was decreased in half by his nephrologist.  Past Medical History:  Diagnosis Date   Arthritis    BPH (benign prostatic hyperplasia)    CAD (coronary artery disease)    a. 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 06/2018 Cath: Native 3VD, 2/3 patent grafts (VG->OM2 100)->Med rx; c. 05/2019 NSTEMI/Cath:  LM nl, LAD 99ost, 148m LCX 1019mRCA 100p, LIMA->LAD ok, VG->RPDA ok (? jump to OM), VG->OM not visualized (prev occluded). LVEDP 3049m-->Med Rx.   CKD (chronic kidney disease), stage III    Gout    Gouty arthropathy, chronic, with tophi    Hernia, abdominal    HFrEF (heart failure with reduced ejection fraction) ([facility]  a. 06/2018 Echo: EF 45-50%, mild MR; b. 05/2019 Echo: EF 30-35%, diff HK.   Hyperlipidemia LDL goal <70    Hypertension    Ischemic cardiomyopathy    a. 06/2018 Echo: EF 45-50%;  b. 05/2019 Echo: EF 30-35%, diff HK.   MGUS (monoclonal gammopathy of unknown significance)    Myocardial infarction (HCC)    PAF (paroxysmal atrial fibrillation) ([place])    a. Dx 05/2019-->CHA2DS2VASc = 5-->Eliquis.   Renal cyst     Past Surgical History:  Procedure Laterality Date   CARDIAC CATHETERIZATION     carpel tunnel     COLONOSCOPY WITH PROPOFOL N/A 01/14/2020   Procedure: COLONOSCOPY WITH PROPOFOL;  Surgeon: Toledo, Benay Pike, MD;  Location: [facility] ENDOSCOPY;  Service:  Gastroenterology;  Laterality: N/A;   CORONARY ARTERY BYPASS GRAFT     ESOPHAGOGASTRODUODENOSCOPY (EGD) WITH PROPOFOL N/A 01/14/2020   Procedure: ESOPHAGOGASTRODUODENOSCOPY (EGD) WITH PROPOFOL;  Surgeon: Toledo, Benay Pike, MD;  Location: [facility] ENDOSCOPY;  Service: Gastroenterology;  Laterality: N/A;   JOINT REPLACEMENT Bilateral 2002  2011   LEFT HEART CATH AND CORONARY ANGIOGRAPHY N/A 06/03/2018   Procedure: LEFT HEART CATH AND CORS/GRAFTS ANGIOGRAPHY;  Surgeon: Isaias Cowman, MD;  Location: [place] [facility];  Service: Cardiovascular;  Laterality: N/A;   LEFT HEART CATH AND CORS/GRAFTS ANGIOGRAPHY Bilateral 05/07/2019   Procedure: LEFT HEART CATH AND CORS/GRAFTS ANGIOGRAPHY;  Surgeon: Minna Merritts, MD;  Location: [place] [facility];  Service: Cardiovascular;  Laterality: Bilateral;   REPLACEMENT TOTAL KNEE     VEIN BYPASS SURGERY       Current Outpatient Medications  Medication Sig Dispense Refill   amLODipine-benazepril (LOTREL) 5-20 MG capsule Take 1 capsule by mouth daily.     apixaban (ELIQUIS) 2.5 MG TABS tablet Take 1 tablet (2.5 mg total) by mouth 2 (two) times daily. 60 tablet 6   atorvastatin (LIPITOR) 80 MG tablet Take 80 mg by mouth daily.      carvedilol (COREG) 25 MG tablet Take 1 tablet (25 mg total) by mouth 2 (two) times daily with a meal. 180 tablet 3   Coenzyme Q10 (COQ10) 100 MG CAPS Take 100 mg by mouth daily.     ezetimibe (ZETIA) 10 MG tablet Take 1 tablet (10 mg total) by mouth daily. 90 tablet 1   famotidine (PEPCID) 20 MG tablet Take 20 mg by mouth 2 (two) times daily.     fluticasone (FLONASE) 50 MCG/ACT nasal spray Place 1 spray into both nostrils 2 (two) times a day.     furosemide (LASIX) 20 MG tablet Take 1 tablet (20 mg total) by mouth daily. 90 tablet 3   isosorbide mononitrate (IMDUR) 60 MG 24 hr tablet TAKE 1 TABLET(60 MG) BY MOUTH DAILY 90 tablet 0   Multiple Vitamins-Minerals (MENS 50+ MULTI VITAMIN/MIN) TABS Take 1 tablet  by mouth daily.      nitroGLYCERIN (NITROSTAT) 0.4 MG SL tablet Place 1 tablet (0.4 mg total) under the tongue every 5 (five) minutes as needed for chest pain (Nitro if taking third dose, call 911). 25 tablet 3   Omega-3 1000 MG CAPS Take 1,000 mg by mouth daily.      No current facility-administered medications for this visit.    Allergies:   Penicillins    Social History:  The patient  reports that he has never smoked. He has never used smokeless tobacco. He reports that he does not drink alcohol and does not use drugs.   Family History:  The patient's family history includes Asthma in his mother; Diabetes in his father; Hypertension in his father.    ROS:  Please see the history of present illness.   Otherwise, review of systems are positive for none.   All other  systems are reviewed and negative.    PHYSICAL EXAM: VS:  BP 140/60 (BP Location: Left Arm, Patient Position: Sitting, Cuff Size: Normal)    Pulse 70    Ht 5' 9"  (1.753 m)    Wt 240 lb (108.9 kg)    SpO2 98%    BMI 35.44 kg/m  , BMI Body mass index is 35.44 kg/m. GEN: Well nourished, well developed, in no acute distress  HEENT: normal  Neck: no JVD, , or masses.  Right carotid bruit. Cardiac: RRR; no murmurs, rubs, or gallops,no edema  Respiratory:  clear to auscultation bilaterally, normal work of breathing GI: soft, nontender, nondistended, + BS MS: no deformity or atrophy  Skin: warm and dry, no rash Neuro:  Strength and sensation are intact Psych: euthymic mood, full affect   EKG:  EKG is ordered today. The ekg ordered today demonstrates normal sinus rhythm with old inferior infarct.   Recent Labs: 06/07/2020: BUN 48; Creatinine, Ser 2.05; Hemoglobin 11.4; Platelets 206; Potassium 4.6; Sodium 141 07/11/2020: ALT 20    Lipid Panel    Component Value Date/Time   CHOL 90 07/11/2020 1101   CHOL 178 05/24/2020 1026   TRIG 51 07/11/2020 1101   HDL 31 (L) 07/11/2020 1101   HDL 30 (L) 05/24/2020 1026   CHOLHDL  2.9 07/11/2020 1101   VLDL 10 07/11/2020 1101   LDLCALC 49 07/11/2020 1101   LDLCALC 130 (H) 05/24/2020 1026      Wt Readings from Last 3 Encounters:  09/01/20 240 lb (108.9 kg)  05/24/20 241 lb 2 oz (109.4 kg)  01/14/20 245 lb (111.1 kg)        PAD Screen 12/12/2018  Previous PAD dx? No  Previous surgical procedure? No  Pain with walking? No  Feet/toe relief with dangling? No  Painful, non-healing ulcers? No  Extremities discolored? No      ASSESSMENT AND PLAN:  1.  Coronary artery disease involving native coronary arteries with stable angina.  His symptoms are controlled on current antianginal medications.    2.  Moderate bilateral carotid disease: This was stable on carotid Doppler in May.  Repeat next year.  3.  Essential hypertension: Blood pressure is reasonably controlled.  The dose of Lotrel was decreased by half recently due to episode of hypotension.  4.  Hyperlipidemia: Zetia was added to atorvastatin with significant improvement in lipid profile.  His LDL was 49.  5.  Chronic systolic heart failure: Most recent echocardiogram in January showed an EF of 35 to 40%.  He seems to be euvolemic on small dose furosemide.  Most recent creatinine has been around 2.  I have been hesitant to switch to [facility] given his underlying chronic kidney disease and stability on ACE inhibitor.  6.  Paroxysmal atrial fibrillation: Currently maintaining in sinus rhythm.  He is tolerating anticoagulation with Eliquis.    Disposition:   FU with me in 6 months  Signed,  [name], MD  09/01/2020 1:23 PM    [facility]

## 2020-11-21 ENCOUNTER — Other Ambulatory Visit: Payer: Self-pay | Admitting: Cardiovascular Disease

## 2020-11-24 ENCOUNTER — Other Ambulatory Visit: Payer: Self-pay | Admitting: Family

## 2020-11-28 ENCOUNTER — Other Ambulatory Visit: Payer: Self-pay

## 2020-11-28 DIAGNOSIS — N189 Chronic kidney disease, unspecified: Secondary | ICD-10-CM

## 2020-11-28 DIAGNOSIS — R768 Other specified abnormal immunological findings in serum: Secondary | ICD-10-CM

## 2020-11-29 ENCOUNTER — Inpatient Hospital Stay: Payer: Medicare Other

## 2020-11-29 ENCOUNTER — Inpatient Hospital Stay: Payer: Medicare Other | Attending: Hematology and Oncology | Admitting: Hematology and Oncology

## 2020-12-06 ENCOUNTER — Telehealth: Payer: Self-pay | Admitting: Cardiovascular Disease

## 2020-12-06 MED ORDER — APIXABAN 2.5 MG PO TABS: 2.5000 mg | ORAL_TABLET | Freq: Two times a day (BID) | ORAL | 1 refills | Status: DC

## 2020-12-06 NOTE — Telephone Encounter (Signed)
Eliquis 2.18m refill request received. Patient is 80years old, weight-108.9kg, Crea-2.05 on 06/07/2020, Diagnosis-Afib, and last seen by Dr. AFletcher Anonon 09/01/2020. Dose is appropriate based on dosing criteria. Will send in refill to requested pharmacy.

## 2020-12-06 NOTE — Telephone Encounter (Signed)
*  STAT* If patient is at the pharmacy, call can be transferred to refill team.   1. Which medications need to be refilled? (please list name of each medication and dose if known)    eliquis 2.5 mg po BID  2. Which pharmacy/location (including street and city if local pharmacy) is medication to be sent to?   [facility] [address] [place]   3. Do they need a 30 day or 90 day supply? [place]

## 2020-12-06 NOTE — Telephone Encounter (Signed)
Please review for refill, Thanks !  

## 2020-12-13 ENCOUNTER — Other Ambulatory Visit: Payer: Self-pay

## 2020-12-13 DIAGNOSIS — R768 Other specified abnormal immunological findings in serum: Secondary | ICD-10-CM

## 2020-12-13 DIAGNOSIS — D631 Anemia in chronic kidney disease: Secondary | ICD-10-CM

## 2020-12-13 DIAGNOSIS — N189 Chronic kidney disease, unspecified: Secondary | ICD-10-CM

## 2020-12-13 DIAGNOSIS — N183 Chronic kidney disease, stage 3 unspecified: Secondary | ICD-10-CM

## 2020-12-13 DIAGNOSIS — D649 Anemia, unspecified: Secondary | ICD-10-CM

## 2020-12-14 DIAGNOSIS — D631 Anemia in chronic kidney disease: Secondary | ICD-10-CM | POA: Insufficient documentation

## 2020-12-14 NOTE — Progress Notes (Signed)
[facility]  [address], [address] [place], [place] [address] Phone: [phone]  Fax: [phone]  Clinic Day:  12/15/2020  Referring physician: Hughie Closs, PA-C  Chief Complaint: Jeff Henderson is a 80 y.o. male with elevated free light chains due to CKD who is seen for 1 year assessment.  HPI: The patient was last seen in the hematology clinic on 12/01/2019 via telemedicine.  At that time, he was doing well.  He was active.  He denied any concerns. Hematocrit was 37.1, hemoglobin 11.9, MCV 95.9, platelets 177,000, WBC 7,700. BUN was 40. Creatinine was 1.9 (CrCl 32 ml/min). SPEP was normal. Kappa free light chains were 47.1, lambda free light chains 24.1, and ratio 1.95 (0.26-1.65).  Colonoscopy on 01/14/2020 by Dr. Alice Reichert revealed diverticulosis in the sigmoid colon and non-bleeding internal hemorrhoids. EGD revealed gastritis. There were duodenal mucosal changes c/w celiac disease. There were duodenal erosions without bleeding. Pathology showed enteric mucosa with mild villous blunting and minimal increase in intraepithelial lymphocytes; findings potentially c/w early celiac disease.  The patient saw Dr. Radene Knee on 11/10/2020. His amlodipine/benazepril had been decreased at his last visit due to a low blood pressure reading. He denied any issues after changing his medication. Creatinine was 1.85 (CrCl 34 ml/min). Follow up was planned for 6 months.  Labs on 06/07/2020 revealed a hematocrit of 34.2, hemoglobin 11.4, MCV 93.2, platelets 206,000, WBC 8,300. BUN was 48. Creatinine was 2.05 (CrCl30 ml/min). SPEP was normal. Kappa free light chains was 57.6, lambda free light chains 26.3, and ratio 2.19.  During the interim, he has been "good." He has not had any medical problems. He reports nocturia. He has been staying active by watching his neighbors' pets and houses. He goes to the gym 3x per week.   Past Medical History:  Diagnosis Date  . Arthritis   .  BPH (benign prostatic hyperplasia)   . CAD (coronary artery disease)    a. 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 06/2018 Cath: Native 3VD, 2/3 patent grafts (VG->OM2 100)->Med rx; c. 05/2019 NSTEMI/Cath:  LM nl, LAD 99ost, 198m LCX 1052mRCA 100p, LIMA->LAD ok, VG->RPDA ok (? jump to OM), VG->OM not visualized (prev occluded). LVEDP 3033m-->Med Rx.  . CKD (chronic kidney disease), stage III ([facility] . Gout   . Gouty arthropathy, chronic, with tophi   . Hernia, abdominal   . HFrEF (heart failure with reduced ejection fraction) ([facility]  a. 06/2018 Echo: EF 45-50%, mild MR; b. 05/2019 Echo: EF 30-35%, diff HK.  . HMarland Kitchenperlipidemia LDL goal <70   . Hypertension   . Ischemic cardiomyopathy    a. 06/2018 Echo: EF 45-50%; b. 05/2019 Echo: EF 30-35%, diff HK.  . MMarland KitchenUS (monoclonal gammopathy of unknown significance)   . Myocardial infarction ([facility] . PAF (paroxysmal atrial fibrillation) ([facility]  a. Dx 05/2019-->CHA2DS2VASc = 5-->Eliquis.  . Renal cyst     Past Surgical History:  Procedure Laterality Date  . CARDIAC CATHETERIZATION    . carpel tunnel    . COLONOSCOPY WITH PROPOFOL N/A 01/14/2020   Procedure: COLONOSCOPY WITH PROPOFOL;  Surgeon: Toledo, TeoBenay PikeD;  Location: [facility] ENDOSCOPY;  Service: Gastroenterology;  Laterality: N/A;  . CORONARY ARTERY BYPASS GRAFT    . ESOPHAGOGASTRODUODENOSCOPY (EGD) WITH PROPOFOL N/A 01/14/2020   Procedure: ESOPHAGOGASTRODUODENOSCOPY (EGD) WITH PROPOFOL;  Surgeon: Toledo, TeoBenay PikeD;  Location: [facility] ENDOSCOPY;  Service: Gastroenterology;  Laterality: N/A;  . JOINT REPLACEMENT Bilateral 2002  2011  .  LEFT HEART CATH AND CORONARY ANGIOGRAPHY N/A 06/03/2018   Procedure: LEFT HEART CATH AND CORS/GRAFTS ANGIOGRAPHY;  Surgeon: Isaias Cowman, MD;  [facility];  Service: Cardiovascular;  Laterality: N/A;  . LEFT HEART CATH AND CORS/GRAFTS ANGIOGRAPHY Bilateral 05/07/2019   Procedure: LEFT HEART CATH AND CORS/GRAFTS ANGIOGRAPHY;   Surgeon: Minna Merritts, MD;  Location: [facility];  Service: Cardiovascular;  Laterality: Bilateral;  . REPLACEMENT TOTAL KNEE    . VEIN BYPASS SURGERY      Family History  Problem Relation Age of Onset  . Asthma Mother   . Diabetes Father   . Hypertension Father     Social History:  reports that he has never smoked. He has never used smokeless tobacco. He reports that he does not drink alcohol and does not use drugs. The patient has been in the [employer]. He retired from the police and Research officer, trade union. He denies any exposure to radiation and toxins. The patient is alone today.  Allergies:  Allergies  Allergen Reactions  . Penicillins Rash and Other (See Comments)    Has patient had a PCN reaction causing immediate rash, facial/tongue/throat swelling, SOB or lightheadedness with hypotension: No Has patient had a PCN reaction causing severe rash involving mucus membranes or skin necrosis: No Has patient had a PCN reaction that required hospitalization: No Has patient had a PCN reaction occurring within the last 10 years: No If all of the above answers are "NO", then may proceed with Cephalosporin use.     Current Medications: Current Outpatient Medications  Medication Sig Dispense Refill  . amLODipine-benazepril (LOTREL) 5-20 MG capsule Take 1 capsule by mouth daily.    Marland Kitchen apixaban (ELIQUIS) 2.5 MG TABS tablet Take 1 tablet (2.5 mg total) by mouth 2 (two) times daily. 180 tablet 1  . atorvastatin (LIPITOR) 80 MG tablet Take 80 mg by mouth daily.     . carvedilol (COREG) 25 MG tablet Take 1 tablet (25 mg total) by mouth 2 (two) times daily with a meal. 180 tablet 3  . Coenzyme Q10 (COQ10) 100 MG CAPS Take 100 mg by mouth daily.    Marland Kitchen ezetimibe (ZETIA) 10 MG tablet TAKE 1 TABLET(10 MG) BY MOUTH DAILY 90 tablet 0  . famotidine (PEPCID) 20 MG tablet Take 20 mg by mouth 2 (two) times daily.    . fluticasone (FLONASE) 50 MCG/ACT nasal spray Place 1 spray into both nostrils 2  (two) times a day.    . furosemide (LASIX) 20 MG tablet Take 1 tablet (20 mg total) by mouth daily. 90 tablet 3  . isosorbide mononitrate (IMDUR) 60 MG 24 hr tablet TAKE 1 TABLET(60 MG) BY MOUTH DAILY 90 tablet 0  . Multiple Vitamins-Minerals (MENS 50+ MULTI VITAMIN/MIN) TABS Take 1 tablet by mouth daily.     . nitroGLYCERIN (NITROSTAT) 0.4 MG SL tablet Place 1 tablet (0.4 mg total) under the tongue every 5 (five) minutes as needed for chest pain (Nitro if taking third dose, call 911). 25 tablet 3  . Omega-3 1000 MG CAPS Take 1,000 mg by mouth daily.      No current facility-administered medications for this visit.    Review of Systems  Constitutional: Positive for weight loss (up 6 lbs since 05/2020). Negative for chills, diaphoresis, fever and malaise/fatigue.       Feels "good."  HENT: Negative for congestion, ear discharge, ear pain, hearing loss, nosebleeds, sinus pain, sore throat and tinnitus.   Eyes: Negative.  Negative for blurred  vision, double vision and photophobia.  Respiratory: Negative for cough, hemoptysis, sputum production and shortness of breath.   Cardiovascular: Negative.  Negative for chest pain, palpitations and leg swelling.  Gastrointestinal: Negative for abdominal pain, blood in stool, constipation, diarrhea, heartburn, melena, nausea and vomiting.  Genitourinary: Positive for frequency (nocturia). Negative for dysuria, hematuria and urgency.  Musculoskeletal: Negative.  Negative for back pain, joint pain, myalgias and neck pain.  Skin: Negative.  Negative for itching and rash.  Neurological: Negative.  Negative for dizziness, tingling, sensory change, speech change, focal weakness, weakness and headaches.  Endo/Heme/Allergies: Does not bruise/bleed easily.  Psychiatric/Behavioral: Negative.  Negative for depression and memory loss. The patient is not nervous/anxious and does not have insomnia.   All other systems reviewed and are negative.  Performance status  (ECOG): 1  Vitals:  Blood pressure (!) 142/61, pulse 64, temperature (!) 96.7 F (35.9 C), temperature source Tympanic, resp. rate 16, weight 247 lb 9.2 oz (112.3 kg), SpO2 96 %.   Physical Exam Vitals and nursing note reviewed.  Constitutional:      General: He is not in acute distress.    Appearance: He is not diaphoretic.  HENT:     Head: Normocephalic and atraumatic.     Mouth/Throat:     Mouth: Mucous membranes are moist.     Pharynx: Oropharynx is clear.  Eyes:     General: No scleral icterus.    Extraocular Movements: Extraocular movements intact.     Conjunctiva/sclera: Conjunctivae normal.     Pupils: Pupils are equal, round, and reactive to light.  Cardiovascular:     Rate and Rhythm: Normal rate and regular rhythm.     Heart sounds: Normal heart sounds. No murmur heard.   Pulmonary:     Effort: Pulmonary effort is normal. No respiratory distress.     Breath sounds: Normal breath sounds. No wheezing or rales.  Chest:     Chest wall: No tenderness.  Breasts:     Right: No axillary adenopathy or supraclavicular adenopathy.     Left: No axillary adenopathy or supraclavicular adenopathy.    Abdominal:     General: Bowel sounds are normal. There is no distension.     Palpations: Abdomen is soft. There is no mass.     Tenderness: There is no abdominal tenderness. There is no guarding or rebound.     Hernia: A hernia (umbilical) is present.  Musculoskeletal:        General: No swelling or tenderness. Normal range of motion.     Cervical back: Normal range of motion and neck supple.  Lymphadenopathy:     Head:     Right side of head: No preauricular, posterior auricular or occipital adenopathy.     Left side of head: No preauricular, posterior auricular or occipital adenopathy.     Cervical: No cervical adenopathy.     Upper Body:     Right upper body: No supraclavicular or axillary adenopathy.     Left upper body: No supraclavicular or axillary adenopathy.      Lower Body: No right inguinal adenopathy. No left inguinal adenopathy.  Skin:    General: Skin is warm and dry.     Comments: Bruise on chin, hit on cabinet at home  Neurological:     Mental Status: He is alert and oriented to person, place, and time.  Psychiatric:        Mood and Affect: Mood and affect normal.  Behavior: Behavior normal.        Thought Content: Thought content normal.        Cognition and Memory: Memory normal.        Judgment: Judgment normal.      Appointment on 12/15/2020  Component Date Value Ref Range Status  . Sodium 12/15/2020 138  135 - 145 mmol/L Final  . Potassium 12/15/2020 4.1  3.5 - 5.1 mmol/L Final  . Chloride 12/15/2020 102  98 - 111 mmol/L Final  . CO2 12/15/2020 30  22 - 32 mmol/L Final  . Glucose, Bld 12/15/2020 133* 70 - 99 mg/dL Final   Glucose reference range applies only to samples taken after fasting for at least 8 hours.  . BUN 12/15/2020 31* 8 - 23 mg/dL Final  . Creatinine, Ser 12/15/2020 1.71* 0.61 - 1.24 mg/dL Final  . Calcium 12/15/2020 8.9  8.9 - 10.3 mg/dL Final  . Total Protein 12/15/2020 7.0  6.5 - 8.1 g/dL Final  . Albumin 12/15/2020 3.9  3.5 - 5.0 g/dL Final  . AST 12/15/2020 33  15 - 41 U/L Final  . ALT 12/15/2020 33  0 - 44 U/L Final  . Alkaline Phosphatase 12/15/2020 49  38 - 126 U/L Final  . Total Bilirubin 12/15/2020 0.9  0.3 - 1.2 mg/dL Final  . GFR, Estimated 12/15/2020 40* >60 mL/min Final   Comment: (NOTE) Calculated using the CKD-EPI Creatinine Equation (2021)   . Anion gap 12/15/2020 6  5 - 15 Final   Performed at [facility], [address] [place], [place] [address]  . WBC 12/15/2020 8.1  4.0 - 10.5 K/uL Final  . RBC 12/15/2020 3.90* 4.22 - 5.81 MIL/uL Final  . Hemoglobin 12/15/2020 12.2* 13.0 - 17.0 g/dL Final  . HCT 12/15/2020 37.6* 39.0 - 52.0 % Final  . MCV 12/15/2020 96.4  80.0 - 100.0 fL Final  . MCH 12/15/2020 31.3  26.0 - 34.0 pg Final  . MCHC 12/15/2020 32.4  30.0 - 36.0 g/dL  Final  . RDW 12/15/2020 12.9  11.5 - 15.5 % Final  . Platelets 12/15/2020 179  150 - 400 K/uL Final  . nRBC 12/15/2020 0.0  0.0 - 0.2 % Final  . Neutrophils Relative % 12/15/2020 69  % Final  . Neutro Abs 12/15/2020 5.6  1.7 - 7.7 K/uL Final  . Lymphocytes Relative 12/15/2020 17  % Final  . Lymphs Abs 12/15/2020 1.3  0.7 - 4.0 K/uL Final  . Monocytes Relative 12/15/2020 7  % Final  . Monocytes Absolute 12/15/2020 0.6  0.1 - 1.0 K/uL Final  . Eosinophils Relative 12/15/2020 6  % Final  . Eosinophils Absolute 12/15/2020 0.5  0.0 - 0.5 K/uL Final  . Basophils Relative 12/15/2020 1  % Final  . Basophils Absolute 12/15/2020 0.0  0.0 - 0.1 K/uL Final  . Immature Granulocytes 12/15/2020 0  % Final  . Abs Immature Granulocytes 12/15/2020 0.03  0.00 - 0.07 K/uL Final   Performed at [facility], [address], [place], [place] [address]    Assessment:  Syler Norcia is a 80 y.o. male with elevated free light chains secondary to chronic kidney disease (CKD).  Bone marrow biopsy on 05/01/2013 revealed a normocellular bone 40% with trilineage hematopoiesis and 1-2% polyclonal plasma cells.  Fat pad biopsy on 05/19/2013 did not reveal any evidence of malignancy or amyloidosis.    Skeletal survey on 05/04/2013 revealed lucencies of the calvarium, cervical spine, pelvis and proximal humeri which may  represent metastases/myeloma.  Pelvic MRI on 05/20/2013 revealed no myelomatous lesions.  Lucencies were felt non-specific and likely artifact.  Hemoglobin has been stable between 12-13.  He has no hypercalcemia.  Creatinine has ranged between 1.84 - 2.05 in 2021.  M-spike has been followed: 0 on 09/30/2017, 04/08/2018, 07/16/2018, 10/20/2018, 06/02/2019, 11/30/2019, 06/07/2020, and 12/15/2020.  Kappa free light chains have been followed: 41.7 (ratio 1.45) on 09/30/2017, 46.6 (ratio 2.09) on 04/08/2018, 36.1 (ratio 1.85) on 07/16/2018, 37.6 (ratio 1.75) on 10/10/2018, 35.8 (ratio 1.75) on  06/02/2019, 47.1 (ratio 1.95) on 11/30/2019, 57.6 (ratio 2.19) on 06/07/2020, and 44.1 (ratio 1.76) on 12/15/2020.  Colonoscopy on 01/14/2020 revealed diverticulosis in the sigmoid colon and non-bleeding internal hemorrhoids. EGD on 01/14/2020 revealed gastritis. There were duodenal mucosal changes c/w celiac disease. There were duodenal erosions without bleeding. Pathology showed enteric mucosa with mild villous blunting and minimal increase in intraepithelial lymphocytes; findings potentially c/w early celiac disease.  He was admitted to [facility] from 05/06/2019 - 05/08/2019 with atrial fibrillation and RVR.  He underwent cardiac cath which showed chronically stable coronary artery disease.  Echo on 05/07/2019 revealed an ejection fraction of 30-35%. He was started on Eliquis.  Symptomatically, he feels "good."  He denies any bone pain.  He goes to the gym 3x per week.  Exam is stable.  Plan: 1.   Labs today: CBC with diff, CMP, SPEP, FLCA. 2.   Elevated free light chains             Clinically, he is doing well  M-spike is zero.  Kappa free light chains 44.1 (ratio 1.76) on 12/15/2020.             Etiology felt secondary to chronic kidney disease.               There is no evidence of an underlying plasmacytic disorder.  Continue yearly surveillance 3.   Anemia of chronic kidney disease              Hematocrit 37.6.  Hemoglobin 12.2.  MCV 96.4 on 12/15/2020.    Creatinine 1.71 (improved).             B12, folate, TSH, ferritin, iron studies, retic count were normal on 06/02/2019. 4.   RTC in 6 months for labs (CBC with diff, BMP, FLCA). 5.   RTC in 1 year for MD assessment and labs (CBC with diff, BMP, SPEP, FLCA).  I discussed the assessment and treatment plan with the patient.  The patient was provided an opportunity to ask questions and all were answered.  The patient agreed with the plan and demonstrated an understanding of the instructions.  The patient was advised to call back if the  symptoms worsen or if the condition fails to improve as anticipated.   Lequita Asal, MD, PhD    12/15/2020, 9:54 AM  I, Mirian Mo Tufford, am acting as Education administrator for [employer]. Mike Gip, MD, PhD.  I, Detric Scalisi C. Mike Gip, MD, have reviewed the above documentation for accuracy and completeness, and I agree with the above.

## 2020-12-15 ENCOUNTER — Other Ambulatory Visit: Payer: Self-pay

## 2020-12-15 ENCOUNTER — Inpatient Hospital Stay (HOSPITAL_BASED_OUTPATIENT_CLINIC_OR_DEPARTMENT_OTHER): Payer: Medicare Other | Admitting: Hematology and Oncology

## 2020-12-15 ENCOUNTER — Inpatient Hospital Stay: Payer: Medicare Other | Attending: Hematology and Oncology

## 2020-12-15 ENCOUNTER — Encounter: Payer: Self-pay | Admitting: Hematology and Oncology

## 2020-12-15 VITALS — BP 142/61 | HR 64 | Temp 96.7°F | Resp 16 | Wt 247.6 lb

## 2020-12-15 DIAGNOSIS — R768 Other specified abnormal immunological findings in serum: Secondary | ICD-10-CM

## 2020-12-15 DIAGNOSIS — N189 Chronic kidney disease, unspecified: Secondary | ICD-10-CM

## 2020-12-15 DIAGNOSIS — Z79899 Other long term (current) drug therapy: Secondary | ICD-10-CM | POA: Diagnosis not present

## 2020-12-15 DIAGNOSIS — D631 Anemia in chronic kidney disease: Secondary | ICD-10-CM | POA: Diagnosis not present

## 2020-12-15 DIAGNOSIS — N183 Chronic kidney disease, stage 3 unspecified: Secondary | ICD-10-CM | POA: Diagnosis not present

## 2020-12-15 LAB — COMPREHENSIVE METABOLIC PANEL
ALT: 33 U/L (ref 0–44)
AST: 33 U/L (ref 15–41)
Albumin: 3.9 g/dL (ref 3.5–5.0)
Alkaline Phosphatase: 49 U/L (ref 38–126)
Anion gap: 6 (ref 5–15)
BUN: 31 mg/dL — ABNORMAL HIGH (ref 8–23)
CO2: 30 mmol/L (ref 22–32)
Calcium: 8.9 mg/dL (ref 8.9–10.3)
Chloride: 102 mmol/L (ref 98–111)
Creatinine, Ser: 1.71 mg/dL — ABNORMAL HIGH (ref 0.61–1.24)
GFR, Estimated: 40 mL/min — ABNORMAL LOW (ref >60)
Glucose, Bld: 133 mg/dL — ABNORMAL HIGH (ref 70–99)
Potassium: 4.1 mmol/L (ref 3.5–5.1)
Sodium: 138 mmol/L (ref 135–145)
Total Bilirubin: 0.9 mg/dL (ref 0.3–1.2)
Total Protein: 7 g/dL (ref 6.5–8.1)

## 2020-12-15 LAB — CBC WITH DIFFERENTIAL/PLATELET
Abs Immature Granulocytes: 0.03 10*3/uL (ref 0.00–0.07)
Basophils Absolute: 0 10*3/uL (ref 0.0–0.1)
Basophils Relative: 1 %
Eosinophils Absolute: 0.5 10*3/uL (ref 0.0–0.5)
Eosinophils Relative: 6 %
HCT: 37.6 % — ABNORMAL LOW (ref 39.0–52.0)
Hemoglobin: 12.2 g/dL — ABNORMAL LOW (ref 13.0–17.0)
Immature Granulocytes: 0 %
Lymphocytes Relative: 17 %
Lymphs Abs: 1.3 10*3/uL (ref 0.7–4.0)
MCH: 31.3 pg (ref 26.0–34.0)
MCHC: 32.4 g/dL (ref 30.0–36.0)
MCV: 96.4 fL (ref 80.0–100.0)
Monocytes Absolute: 0.6 10*3/uL (ref 0.1–1.0)
Monocytes Relative: 7 %
Neutro Abs: 5.6 10*3/uL (ref 1.7–7.7)
Neutrophils Relative %: 69 %
Platelets: 179 10*3/uL (ref 150–400)
RBC: 3.9 MIL/uL — ABNORMAL LOW (ref 4.22–5.81)
RDW: 12.9 % (ref 11.5–15.5)
WBC: 8.1 10*3/uL (ref 4.0–10.5)
nRBC: 0 % (ref 0.0–0.2)

## 2020-12-16 LAB — PROTEIN ELECTROPHORESIS, SERUM
A/G Ratio: 1.4 (ref 0.7–1.7)
Albumin ELP: 3.9 g/dL (ref 2.9–4.4)
Alpha-1-Globulin: 0.2 g/dL (ref 0.0–0.4)
Alpha-2-Globulin: 0.8 g/dL (ref 0.4–1.0)
Beta Globulin: 0.9 g/dL (ref 0.7–1.3)
Gamma Globulin: 0.8 g/dL (ref 0.4–1.8)
Globulin, Total: 2.7 g/dL (ref 2.2–3.9)
Total Protein ELP: 6.6 g/dL (ref 6.0–8.5)

## 2020-12-16 LAB — KAPPA/LAMBDA LIGHT CHAINS
Kappa free light chain: 44.1 mg/L — ABNORMAL HIGH (ref 3.3–19.4)
Kappa, lambda light chain ratio: 1.76 — ABNORMAL HIGH (ref 0.26–1.65)
Lambda free light chains: 25.1 mg/L (ref 5.7–26.3)

## 2021-01-24 ENCOUNTER — Telehealth: Payer: Self-pay | Admitting: Cardiovascular Disease

## 2021-01-24 NOTE — Telephone Encounter (Signed)
Patient with diagnosis of afib on Eliquis for anticoagulation.    Procedure: upper endoscopy Date of procedure: 03/15/21  CHA2DS2-VASc Score = 5  This indicates a 7.2% annual risk of stroke. The patient's score is based upon: CHF History: Yes HTN History: Yes Diabetes History: No Stroke History: No Vascular Disease History: Yes Age Score: 2 Gender Score: 0   CrCl 12m/min Platelet count 179K  Request is to hold Eliquis for 3 days. 2 day hold should be sufficient for washout prior to endoscopy. Per office protocol, patient can hold Eliquis for 2 days prior to procedure.

## 2021-01-24 NOTE — Telephone Encounter (Signed)
   Primary Cardiologist: [name], MD  Chart reviewed as part of pre-operative protocol coverage. Pharmacy clearane only requested. [name] is on Eliquis due to atrial fibrillation. Per pharmacy review and office protocols, he may hold Eliquis 2 days prior to the planned procedure.   I will route this recommendation to the requesting party via Epic fax function and remove from pre-op pool.  Please call with questions.  Loel Dubonnet, NP 01/24/2021, 4:31 PM

## 2021-01-24 NOTE — Telephone Encounter (Signed)
   [facility] Pre-operative Risk Assessment    HEARTCARE STAFF: - Please ensure there is not already an duplicate clearance open for this procedure. - Under Visit Info/Reason for Call, type in Other and utilize the format Clearance MM/DD/YY or Clearance TBD. Do not use dashes or single digits. - If request is for dental extraction, please clarify the # of teeth to be extracted.  Request for surgical clearance:  1. What type of surgery is being performed? Upper endoscopy  2. When is this surgery scheduled? 03/15/21  3. What type of clearance is required (medical clearance vs. Pharmacy clearance to hold med vs. Both)? Pharm  4. Are there any medications that need to be held prior to surgery and how long? Eliquis 3 days prior  5. Practice name and name of physician performing surgery? [facility]  6. What is the office phone number? [phone]   7.   What is the office fax number? [phone]  8.   Anesthesia type (None, local, MAC, general) ? monitored   Marykay Lex 01/24/2021, 2:06 PM  _________________________________________________________________   (provider comments below)

## 2021-01-28 ENCOUNTER — Other Ambulatory Visit: Payer: Self-pay | Admitting: Cardiovascular Disease

## 2021-02-24 ENCOUNTER — Other Ambulatory Visit: Payer: Self-pay | Admitting: Family

## 2021-02-24 NOTE — Telephone Encounter (Signed)
Rx request sent to pharmacy.  

## 2021-03-01 ENCOUNTER — Ambulatory Visit (INDEPENDENT_AMBULATORY_CARE_PROVIDER_SITE_OTHER): Payer: Medicare Other | Admitting: Family

## 2021-03-01 ENCOUNTER — Other Ambulatory Visit: Payer: Self-pay

## 2021-03-01 ENCOUNTER — Encounter: Payer: Self-pay | Admitting: Family

## 2021-03-01 VITALS — BP 120/62 | HR 69 | Ht 69.0 in | Wt 243.0 lb

## 2021-03-01 DIAGNOSIS — I2510 Atherosclerotic heart disease of native coronary artery without angina pectoris: Secondary | ICD-10-CM | POA: Diagnosis not present

## 2021-03-01 DIAGNOSIS — I10 Essential (primary) hypertension: Secondary | ICD-10-CM

## 2021-03-01 DIAGNOSIS — I480 Paroxysmal atrial fibrillation: Secondary | ICD-10-CM

## 2021-03-01 DIAGNOSIS — I779 Disorder of arteries and arterioles, unspecified: Secondary | ICD-10-CM

## 2021-03-01 DIAGNOSIS — E785 Hyperlipidemia, unspecified: Secondary | ICD-10-CM

## 2021-03-01 DIAGNOSIS — Z7901 Long term (current) use of anticoagulants: Secondary | ICD-10-CM

## 2021-03-01 NOTE — Progress Notes (Signed)
Office Visit    Patient Name: Jeff Henderson Date of Encounter: 03/01/2021 PCP:  C[place], Sarah M, [place]  Cardiologist:  [name], MD  Advanced Practice Provider:  No care team member to display Electrophysiologist:  None    Chief Complaint    Jeff Henderson is a 80 y.o. male with a hx ofcoronary artery disease, chronic systolic heart failure, paroxysmal atrial fibrillation, hypertension, left subclavian artery stenosis, hyperlipidemia, carotid artery disease, MGUS, CKD 3, gout, BPH presents today for follow-up of heart failure and CAD  Past Medical History    Past Medical History:  Diagnosis Date  . Arthritis   . BPH (benign prostatic hyperplasia)   . CAD (coronary artery disease)    a. 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 06/2018 Cath: Native 3VD, 2/3 patent grafts (VG->OM2 100)->Med rx; c. 05/2019 NSTEMI/Cath:  LM nl, LAD 99ost, 161m LCX 1067mRCA 100p, LIMA->LAD ok, VG->RPDA ok (? jump to OM), VG->OM not visualized (prev occluded). LVEDP 3033m-->Med Rx.  . CKD (chronic kidney disease), stage III ([place] . Gout   . Gouty arthropathy, chronic, with tophi   . Hernia, abdominal   . HFrEF (heart failure with reduced ejection fraction) ([place]  a. 06/2018 Echo: EF 45-50%, mild MR; b. 05/2019 Echo: EF 30-35%, diff HK.  . HMarland Kitchenperlipidemia LDL goal <70   . Hypertension   . Ischemic cardiomyopathy    a. 06/2018 Echo: EF 45-50%; b. 05/2019 Echo: EF 30-35%, diff HK.  . MMarland KitchenUS (monoclonal gammopathy of unknown significance)   . Myocardial infarction ([place]CC[place] . PAF (paroxysmal atrial fibrillation) ([place]  a. Dx 05/2019-->CHA2DS2VASc = 5-->Eliquis.  . Renal cyst    Past Surgical History:  Procedure Laterality Date  . CARDIAC CATHETERIZATION    . carpel tunnel    . COLONOSCOPY WITH PROPOFOL N/A 01/14/2020   Procedure: COLONOSCOPY WITH PROPOFOL;  Surgeon: Toledo, TeoBenay PikeD;  Location: [facility] ENDOSCOPY;  Service: Gastroenterology;   Laterality: N/A;  . CORONARY ARTERY BYPASS GRAFT    . ESOPHAGOGASTRODUODENOSCOPY (EGD) WITH PROPOFOL N/A 01/14/2020   Procedure: ESOPHAGOGASTRODUODENOSCOPY (EGD) WITH PROPOFOL;  Surgeon: Toledo, TeoBenay PikeD;  Location: [facility] ENDOSCOPY;  Service: Gastroenterology;  Laterality: N/A;  . JOINT REPLACEMENT Bilateral 2002  2011  . LEFT HEART CATH AND CORONARY ANGIOGRAPHY N/A 06/03/2018   Procedure: LEFT HEART CATH AND CORS/GRAFTS ANGIOGRAPHY;  Surgeon: ParIsaias CowmanD;  [facility];  Service: Cardiovascular;  Laterality: N/A;  . LEFT HEART CATH AND CORS/GRAFTS ANGIOGRAPHY Bilateral 05/07/2019   Procedure: LEFT HEART CATH AND CORS/GRAFTS ANGIOGRAPHY;  Surgeon: GolMinna MerrittsD;  [facility];  Service: Cardiovascular;  Laterality: Bilateral;  . REPLACEMENT TOTAL KNEE    . VEIN BYPASS SURGERY      Allergies  Allergies  Allergen Reactions  . Penicillins Rash and Other (See Comments)    Has patient had a PCN reaction causing immediate rash, facial/tongue/throat swelling, SOB or lightheadedness with hypotension: No Has patient had a PCN reaction causing severe rash involving mucus membranes or skin necrosis: No Has patient had a PCN reaction that required hospitalization: No Has patient had a PCN reaction occurring within the last 10 years: No If all of the above answers are "NO", then may proceed with Cephalosporin use.     History of Present Illness    Jeff Henderson a 80 49o. male with a hx of coronary artery disease, chronic  systolic heart failure, paroxysmal atrial fibrillation, hypertension, left subclavian artery stenosis, hyperlipidemia, MGUS, carotid artery disease, CKD 3, gout, BPH last seen 09/01/2020 by Dr. Fletcher Anon.    His coronary artery disease is s/p CABG in 1995.  Noted left subclavian stenosis up to 70% during cardiac catheterization at [facility] in 2011.  He is admitted to [facility] June 2019 with NSTEMI.  Catheterization by Dr.  Saralyn Pilar with severe three-vessel coronary disease including CTO of the proximal RCA and mid LAD.  Distal LCx and grafted OM appeared chronically occluded.  There was 90% ostial LAD stenosis supplying 2 small diagonal branches.  LIMA to LAD was patent as well as SVG to RCA.  There is suggestion of stenosis in proximal left subclavian artery though not well visualized.  LVEF mildly reduced at 45 to 50% on echo during that admission.  Carotid Doppler March 2020 with moderate bilateral carotid disease with no evidence of significant stenosis.  No artery duplex with no evidence of renal artery stenosis.  Hospitalized June 2020 with chest pain in the setting of new atrial fibrillation with RVR.  Converted to NSR on his own.  Repeat catheterization with no significant change in anatomy.  He was started on Eliquis.  Echo with LVEF 30-35%.  His Lotrel dose has been previously reduced by nephrology due to hypotension.  When last seen 08/2020 he noted stable exertional dyspnea.  Transition to Delene Loll was deferred as he was overall stable on ACE and for protection of renal function.  He presents today for follow-up. Tells me he is looking forward to a class reunion in May.  He continues to exercise by going to the gym at least 3 times per week. Reports no shortness of breath nor dyspnea on exertion. Reports no chest pain, pressure, or tightness. No edema, orthopnea, PND. Reports no palpitations.   EKGs/Labs/Other Studies Reviewed:   The following studies were reviewed today:    Carotid duplex 04/27/20 Summary:  Right Carotid: Velocities in the right ICA are consistent with a 60-79%                 stenosis. Non-hemodynamically significant plaque <50% noted  in    the CCA. The ECA appears >50% stenosed.   Left Carotid: Velocities in the left ICA are consistent with a 40-59%  stenosis.               Non-hemodynamically significant plaque <50% noted in the  CCA. The                ECA appears <50% stenosed.    Vertebrals:  Bilateral vertebral arteries demonstrate antegrade flow.  Subclavians: Right subclavian artery was stenotic. Normal flow  hemodynamics were               seen in the left subclavian artery.    Echo 12/2019  1. Left ventricular ejection fraction, by visual estimation, is 35 to  40%. The left ventricle has mild to moderately decreased function. There  is no left ventricular hypertrophy.   2. Left ventricular diastolic parameters are consistent with Grade I  diastolic dysfunction (impaired relaxation).   3. Mildly dilated left ventricular internal cavity size.   4. The left ventricle demonstrates global hypokinesis.   5. Global right ventricle has normal systolic function.The right  ventricular size is normal. No increase in right ventricular wall  thickness.   6. Left atrial size was mild-moderately dilated.   7. TR signal is inadequate for assessing pulmonary artery systolic  pressure.    EKG:  EKG is ordered today.  The ekg ordered today demonstrates SR 69 bpm with stable prior inferior infarct which is present on previous EKG.   Recent Labs: 12/15/2020: ALT 33; BUN 31; Creatinine, Ser 1.71; Hemoglobin 12.2; Platelets 179; Potassium 4.1; Sodium 138  Recent Lipid Panel    Component Value Date/Time   CHOL 90 07/11/2020 1101   CHOL 178 05/24/2020 1026   TRIG 51 07/11/2020 1101   HDL 31 (L) 07/11/2020 1101   HDL 30 (L) 05/24/2020 1026   CHOLHDL 2.9 07/11/2020 1101   VLDL 10 07/11/2020 1101   LDLCALC 49 07/11/2020 1101   LDLCALC 130 (H) 05/24/2020 1026   Risk Assessment/Calculations:   CHA2DS2-VASc Score = 4  This indicates a 4.8% annual risk of stroke. The patient's score is based upon: CHF History: Yes HTN History: No Diabetes History: No Stroke History: No Vascular Disease History: Yes Age Score: 2 Gender Score: 0   Home Medications   Current Meds  Medication Sig  . amLODipine-benazepril (LOTREL) 5-20 MG capsule Take 1 capsule by mouth daily.  Marland Kitchen  apixaban (ELIQUIS) 2.5 MG TABS tablet Take 1 tablet (2.5 mg total) by mouth 2 (two) times daily.  Marland Kitchen atorvastatin (LIPITOR) 80 MG tablet Take 80 mg by mouth daily.   . carvedilol (COREG) 25 MG tablet Take 1 tablet (25 mg total) by mouth 2 (two) times daily with a meal.  . Coenzyme Q10 (COQ10) 100 MG CAPS Take 100 mg by mouth daily.  Marland Kitchen ezetimibe (ZETIA) 10 MG tablet TAKE 1 TABLET(10 MG) BY MOUTH DAILY  . famotidine (PEPCID) 20 MG tablet Take 20 mg by mouth 2 (two) times daily.  . furosemide (LASIX) 20 MG tablet Take 1 tablet (20 mg total) by mouth daily.  . isosorbide mononitrate (IMDUR) 60 MG 24 hr tablet TAKE 1 TABLET(60 MG) BY MOUTH DAILY  . Multiple Vitamins-Minerals (MENS 50+ MULTI VITAMIN/MIN) TABS Take 1 tablet by mouth daily.   . nitroGLYCERIN (NITROSTAT) 0.4 MG SL tablet Place 1 tablet (0.4 mg total) under the tongue every 5 (five) minutes as needed for chest pain (Nitro if taking third dose, call 911).  . Omega-3 1000 MG CAPS Take 1,000 mg by mouth daily.      Review of Systems  All other systems reviewed and are otherwise negative except as noted above.  Physical Exam    VS:  BP 120/62 (BP Location: Right Arm, Patient Position: Sitting, Cuff Size: Large)   Pulse 69   Ht 5' 9"  (1.753 m)   Wt 243 lb (110.2 kg)   SpO2 96%   BMI 35.88 kg/m  , BMI Body mass index is 35.88 kg/m.  Wt Readings from Last 3 Encounters:  03/01/21 243 lb (110.2 kg)  12/15/20 247 lb 9.2 oz (112.3 kg)  09/01/20 240 lb (108.9 kg)     GEN: Well nourished, well developed, in no acute distress. HEENT: normal. Neck: Supple, no JVD  or masses. Cardiac: RRR, no murmurs, rubs, or gallops. No clubbing, cyanosis, edema.  Radials/DP/PT 2+ and equal bilaterally.  Respiratory:  Respirations regular and unlabored, clear to auscultation bilaterally. GI: Soft, nontender, nondistended. MS: No deformity or atrophy. Skin: Warm and dry, no rash. Neuro:  Strength and sensation are intact. Psych: Normal  affect.  Assessment & Plan    1. CAD-stable with no anginal symptoms.  No indication for ischemic evaluation at this time.  GDMT includes atorvastatin 80 mg daily, Coreg 25 mg twice daily, Zetia 10 mg  daily, Imdur 60 mg daily, PRN nitroglycerin.  2. Moderate bilateral carotid disease- Due for repeat study 04/2021.  This has previously been ordered and we will schedule today.  Continue atorvastatin.  No aspirin secondary to chronic anticoagulation.  3. Hypertension- BP well controlled. Continue current antihypertensive regimen.   4. Hyperlipidemia, LDL goal <70 - 07/11/20 LDL 49.  Continue atorvastatin 80 mg daily and Zetia 10 mg daily.  5. HFrEF- Euvolemic and well compensated on exam.  GDMT includes Coreg, Lasix, Imdur, Benazepril. Transition to Delene Loll has been deferred previously due to underlying CKD and stability on ACE inhibitor.  Transition to  [facility] or addition of [facility] could be considered if he becomes increasingly symptomatic.  Would defer addition of MRA due to renal dysfunction.  6. PAF on chronic anticoagulation-maintaining normal sinus rhythm by EKG today.  Denies palpitations.  Tolerating Eliquis 5 mg twice daily without bleeding complications.  CHA2DS2-VASc of at least 5 (agex2, HTN, CAD, HF) does not meet criteria for reduced dose.  Disposition: Follow up in 6 month(s) with Dr. Fletcher Anon or APP  Signed, Loel Dubonnet, NP 03/01/2021, 12:54 PM [place]

## 2021-03-01 NOTE — Patient Instructions (Signed)
Medication Instructions:  Continue your current medications.   *If you need a refill on your cardiac medications before your next appointment, please call your pharmacy*   Lab Work: None ordered today.   Testing/Procedures: Your EKG today showed normal sinus rhythm which is a good result.  Your physician has requested that you have a carotid duplex May 2022. This test is an ultrasound of the carotid arteries in your neck. It looks at blood flow through these arteries that supply the brain with blood. Allow one hour for this exam. There are no restrictions or special instructions.    Follow-Up: At [facility], you and your health needs are our priority.  As part of our continuing mission to provide you with exceptional heart care, we have created designated Provider Care Teams.  These Care Teams include your primary Cardiologist (physician) and Advanced Practice Providers (APPs -  Physician Assistants and Nurse Practitioners) who all work together to provide you with the care you need, when you need it.  We recommend signing up for the patient portal called "MyChart".  Sign up information is provided on this After Visit Summary.  MyChart is used to connect with patients for Virtual Visits (Telemedicine).  Patients are able to view lab/test results, encounter notes, upcoming appointments, etc.  Non-urgent messages can be sent to your provider as well.   To learn more about what you can do with MyChart, go to [email or web address].    Your next appointment:   6 month(s)  The format for your next appointment:   In Person  Provider:   You may see [name], MD or one of the following Advanced Practice Providers on your designated Care Team:    Murray Hodgkins, NP  Christell Faith, PA-C  Marrianne Mood, PA-C  [name], [place]  Laurann Montana, NP  Other Instructions  Heart Healthy Diet Recommendations: A low-salt diet is recommended. Meats should be grilled, baked, or  boiled. Avoid fried foods. Focus on lean protein sources like fish or chicken with vegetables and fruits. The American Heart Association is a Microbiologist!  American Heart Association Diet and Lifeystyle Recommendations   Exercise recommendations: The American Heart Association recommends 150 minutes of moderate intensity exercise weekly. Try 30 minutes of moderate intensity exercise 4-5 times per week. This could include walking, jogging, or swimming. Keep up the good work at the gym!

## 2021-03-08 ENCOUNTER — Telehealth: Payer: Self-pay | Admitting: Cardiovascular Disease

## 2021-03-08 NOTE — Telephone Encounter (Addendum)
Spoke with the patient. Patient sts that he some bright red blood with a bowel movement 2 days ago. He has not had any reoccurrence,  and he denies any other signs of bleeding, no tarry stools. He has a hx of hemorrhoidal procedure and he is scheduled for a EGD on 03/15/21.    Reviewed with Laurann Montana, NP.   No need to hold elqiuis at this time. He should let us know if he has any reoccurrence of bleeding. As well as GI.  Patient sts that he feel that his intermittent dizziness has been going on for years and  Is probably  related to his cervical injury. Patient sts that he will f/u with his neurology to discuss.  He could not provide any current VS but sts that his BP and HR have been fine.  No further action by Cards required at this time.

## 2021-03-08 NOTE — Telephone Encounter (Signed)
STAT if patient feels like he/she is going to faint   1) Are you dizzy now? No but some lightheadedness   2) Do you feel faint or have you passed out? No   3) Do you have any other symptoms? Red  Blood in stool x 2 days ago   4) Have you checked your HR and BP (record if available)?  120/69 the other day at visit not bad no vs from home    Patient read about eliquis and thinks maybe that is the cause.   Please call.

## 2021-03-13 ENCOUNTER — Other Ambulatory Visit: Payer: Medicare Other

## 2021-03-15 ENCOUNTER — Encounter
Admission: RE | Disposition: A | Discharge status: Home / Self Care | Payer: Self-pay | Source: Home / Self Care | Attending: Internal Medicine

## 2021-03-15 ENCOUNTER — Encounter: Payer: Self-pay | Admitting: Internal Medicine

## 2021-03-15 ENCOUNTER — Ambulatory Visit: Payer: Medicare Other | Admitting: Anesthesiology

## 2021-03-15 ENCOUNTER — Ambulatory Visit
Admission: RE | Admit: 2021-03-15 | Discharge: 2021-03-15 | Disposition: A | Discharge status: Home / Self Care | Payer: Medicare Other | Attending: Internal Medicine | Admitting: Internal Medicine

## 2021-03-15 ENCOUNTER — Other Ambulatory Visit: Payer: Self-pay

## 2021-03-15 DIAGNOSIS — Z7901 Long term (current) use of anticoagulants: Secondary | ICD-10-CM | POA: Diagnosis not present

## 2021-03-15 DIAGNOSIS — Z880 Allergy status to penicillin: Secondary | ICD-10-CM | POA: Insufficient documentation

## 2021-03-15 DIAGNOSIS — Z951 Presence of aortocoronary bypass graft: Secondary | ICD-10-CM | POA: Diagnosis not present

## 2021-03-15 DIAGNOSIS — Z79899 Other long term (current) drug therapy: Secondary | ICD-10-CM | POA: Insufficient documentation

## 2021-03-15 DIAGNOSIS — K900 Celiac disease: Secondary | ICD-10-CM | POA: Insufficient documentation

## 2021-03-15 DIAGNOSIS — K2970 Gastritis, unspecified, without bleeding: Secondary | ICD-10-CM | POA: Insufficient documentation

## 2021-03-15 DIAGNOSIS — Z09 Encounter for follow-up examination after completed treatment for conditions other than malignant neoplasm: Secondary | ICD-10-CM | POA: Diagnosis present

## 2021-03-15 HISTORY — PX: ESOPHAGOGASTRODUODENOSCOPY (EGD) WITH PROPOFOL: SHX5813

## 2021-03-15 SURGERY — ESOPHAGOGASTRODUODENOSCOPY (EGD) WITH PROPOFOL
Anesthesia: General

## 2021-03-15 MED ORDER — SODIUM CHLORIDE 0.9 % IV SOLN
INTRAVENOUS | Status: DC
  Administered 2021-03-15: 20 mL/h via INTRAVENOUS

## 2021-03-15 MED ORDER — PROPOFOL 500 MG/50ML IV EMUL
INTRAVENOUS | Status: DC | PRN
  Administered 2021-03-15: 140 ug/kg/min via INTRAVENOUS

## 2021-03-15 MED ORDER — LIDOCAINE HCL (CARDIAC) PF 100 MG/5ML IV SOSY
PREFILLED_SYRINGE | INTRAVENOUS | Status: DC | PRN
  Administered 2021-03-15: 100 mg via INTRAVENOUS

## 2021-03-15 MED ORDER — PROPOFOL 10 MG/ML IV BOLUS
INTRAVENOUS | Status: DC | PRN
  Administered 2021-03-15: 10 mg via INTRAVENOUS
  Administered 2021-03-15: 60 mg via INTRAVENOUS
  Administered 2021-03-15: 20 mg via INTRAVENOUS

## 2021-03-15 MED ORDER — PHENYLEPHRINE HCL (PRESSORS) 10 MG/ML IV SOLN
INTRAVENOUS | Status: DC | PRN
  Administered 2021-03-15: 100 ug via INTRAVENOUS

## 2021-03-15 MED ORDER — GLYCOPYRROLATE 0.2 MG/ML IJ SOLN
INTRAMUSCULAR | Status: DC | PRN
  Administered 2021-03-15: .2 mg via INTRAVENOUS

## 2021-03-15 NOTE — H&P (Signed)
Outpatient short stay form Pre-procedure 03/15/2021 10:09 AM Jeff Henderson K. Alice Reichert, M.D.  Primary Physician: Hortencia Pilar, M.D.  Reason for visit:  Celiac disease  History of present illness:  Follow up of documented history of celiac disease. Patient denies change in bowel habits, rectal bleeding, diarrhea. 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 He is here for monitoring of the status of celiac disease.    Current Facility-Administered Medications:  .  0.9 %  sodium chloride infusion, , Intravenous, Continuous, [place], Benay Pike, MD, Last Rate: 20 mL/hr at 03/15/21 0935, Continued from Pre-op at 03/15/21 0935  Medications Prior to Admission  Medication Sig Dispense Refill Last Dose  . amLODipine-benazepril (LOTREL) 5-20 MG capsule Take 1 capsule by mouth daily.   03/15/2021 at 0600  . apixaban (ELIQUIS) 2.5 MG TABS tablet Take 1 tablet (2.5 mg total) by mouth 2 (two) times daily. 180 tablet 1 Past Week at Unknown time  . atorvastatin (LIPITOR) 80 MG tablet Take 80 mg by mouth daily.    03/14/2021 at Unknown time  . carvedilol (COREG) 25 MG tablet Take 1 tablet (25 mg total) by mouth 2 (two) times daily with a meal. 180 tablet 3 03/14/2021 at Unknown time  . Coenzyme Q10 (COQ10) 100 MG CAPS Take 100 mg by mouth daily.   03/14/2021 at Unknown time  . ezetimibe (ZETIA) 10 MG tablet TAKE 1 TABLET(10 MG) BY MOUTH DAILY 90 tablet 1 03/14/2021 at Unknown time  . famotidine (PEPCID) 20 MG tablet Take 20 mg by mouth 2 (two) times daily.   03/14/2021 at Unknown time  . furosemide (LASIX) 20 MG tablet Take 1 tablet (20 mg total) by mouth daily. 90 tablet 3 03/14/2021 at Unknown time  . isosorbide mononitrate (IMDUR) 60 MG 24 hr tablet TAKE 1 TABLET(60 MG) BY MOUTH DAILY 90 tablet 0 03/14/2021 at Unknown time  . Multiple Vitamins-Minerals (MENS 50+ MULTI VITAMIN/MIN) TABS Take 1 tablet by mouth daily.    03/14/2021 at Unknown time  . nitroGLYCERIN (NITROSTAT) 0.4 MG SL tablet Place 1 tablet (0.4 mg  total) under the tongue every 5 (five) minutes as needed for chest pain (Nitro if taking third dose, call 911). 25 tablet 3 03/14/2021 at Unknown time  . Omega-3 1000 MG CAPS Take 1,000 mg by mouth daily.    03/14/2021 at Unknown time     Allergies  Allergen Reactions  . Penicillins Rash and Other (See Comments)    Has patient had a PCN reaction causing immediate rash, facial/tongue/throat swelling, SOB or lightheadedness with hypotension: No Has patient had a PCN reaction causing severe rash involving mucus membranes or skin necrosis: No Has patient had a PCN reaction that required hospitalization: No Has patient had a PCN reaction occurring within the last 10 years: No If all of the above answers are "NO", then may proceed with Cephalosporin use.      Past Medical History:  Diagnosis Date  . Arthritis   . BPH (benign prostatic hyperplasia)   . CAD (coronary artery disease)    a. 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 06/2018 Cath: Native 3VD, 2/3 patent grafts (VG->OM2 100)->Med rx; c. 05/2019 NSTEMI/Cath:  LM nl, LAD 99ost, 151m LCX 1069mRCA 100p, LIMA->LAD ok, VG->RPDA ok (? jump to OM), VG->OM not visualized (prev occluded). LVEDP 3064m-->Med Rx.  . CKD (chronic kidney disease), stage III ([place] . Gout   . Gouty arthropathy, chronic, with tophi   . Hernia, abdominal   . HFrEF (  heart failure with reduced ejection fraction) ([place])    a. 06/2018 Echo: EF 45-50%, mild MR; b. 05/2019 Echo: EF 30-35%, diff HK.  Marland Kitchen Hyperlipidemia LDL goal <70   . Hypertension   . Ischemic cardiomyopathy    a. 06/2018 Echo: EF 45-50%; b. 05/2019 Echo: EF 30-35%, diff HK.  Marland Kitchen MGUS (monoclonal gammopathy of unknown significance)   . Myocardial infarction ([place])   . PAF (paroxysmal atrial fibrillation) ([place])    a. Dx 05/2019-->CHA2DS2VASc = 5-->Eliquis.  . Renal cyst     Review of systems:  Otherwise negative.    Physical Exam  Gen: Alert, oriented. Appears stated age.  HEENT: [place]/AT.  PERRLA. Lungs: CTA, no wheezes. CV: RR nl S1, S2. Abd: soft, benign, no masses. BS+ Ext: No edema. Pulses 2+    Planned procedures: Proceed with EGD with biopsy of duodenum. The patient understands the nature of the planned procedure, indications, risks, alternatives and potential complications including but not limited to bleeding, infection, perforation, damage to internal organs and possible oversedation/side effects from anesthesia. The patient agrees and gives consent to proceed.  Please refer to procedure notes for findings, recommendations and patient disposition/instructions.     Tonio Seider K. Alice Reichert, M.D. Gastroenterology 03/15/2021  10:09 AM

## 2021-03-15 NOTE — Anesthesia Postprocedure Evaluation (Signed)
Anesthesia Post Note  Patient: Jeff Henderson  Procedure(s) Performed: ESOPHAGOGASTRODUODENOSCOPY (EGD) WITH PROPOFOL (N/A )  Patient location during evaluation: Endoscopy Anesthesia Type: General Level of consciousness: awake and alert and oriented Pain management: pain level controlled Vital Signs Assessment: post-procedure vital signs reviewed and stable Respiratory status: spontaneous breathing, nonlabored ventilation and respiratory function stable Cardiovascular status: blood pressure returned to baseline and stable Postop Assessment: no signs of nausea or vomiting Anesthetic complications: no   No complications documented.   Last Vitals:  Vitals:   03/15/21 1056 03/15/21 1106  BP: (!) 103/58 109/68  Pulse: 64 (!) 59  Resp: 12 15  Temp:    SpO2: 96% 97%    Last Pain:  Vitals:   03/15/21 1106  TempSrc:   PainSc: 0-No pain                 Adriahna Shearman

## 2021-03-15 NOTE — Op Note (Signed)
[facility] Gastroenterology Patient Name: Jeff Henderson Procedure Date: 03/15/2021 10:07 AM MRN: [id number] Account #: [id number] Date of Birth: Aug 10, 1941 Admit Type: Outpatient Age: 80 Room: [facility] ENDO ROOM 2 Gender: Male Note Status: Finalized Procedure:             Upper GI endoscopy Indications:           Follow-up of celiac disease Providers:             Benay Pike. Alice Reichert MD, MD Referring MD:          Mertie Clause. Fletcher Anon, MD (Referring MD) Medicines:             Propofol per Anesthesia Complications:         No immediate complications. Procedure:             Pre-Anesthesia Assessment:                        - The risks and benefits of the procedure and the                         sedation options and risks were discussed with the                         patient. All questions were answered and informed                         consent was obtained.                        - Patient identification and proposed procedure were                         verified prior to the procedure by the nurse. The                         procedure was verified in the procedure room.                        After obtaining informed consent, the endoscope was                         passed under direct vision. Throughout the procedure,                         the patient's blood pressure, pulse, and oxygen                         saturations were monitored continuously. The Endoscope                         was introduced through the mouth, and advanced to the                         third part of duodenum. The upper GI endoscopy was                         accomplished without difficulty. The patient tolerated  the procedure well. Findings:      The esophagus was normal.      Patchy mild inflammation characterized by erosions was found in the       gastric antrum and in the prepyloric region of the stomach.      The cardia and gastric fundus were normal on  retroflexion.      The examined duodenum was normal. Two biopsies were obtained in the       duodenal bulb with cold forceps for evaluation of celiac disease. Two       biopsies were obtained in the second portion of the duodenum with cold       forceps for evaluation of celiac disease. Two biopsies were obtained in       the third portion of the duodenum with cold forceps for evaluation of       celiac disease.      The exam was otherwise without abnormality. Impression:            - Normal esophagus.                        - Gastritis.                        - Normal examined duodenum.                        - The examination was otherwise normal.                        - Two biopsies were obtained in the duodenal bulb.                        - Two biopsies were obtained in the second portion of                         the duodenum.                        - Two biopsies were obtained in the third portion of                         the duodenum. Recommendation:        - Patient has a contact number available for                         emergencies. The signs and symptoms of potential                         delayed complications were discussed with the patient.                         Return to normal activities tomorrow. Written                         discharge instructions were provided to the patient.                        - Resume previous diet.                        -  Continue present medications.                        - Await pathology results.                        - Return to physician assistant in 6 months.                        - Follow up with Octavia Bruckner, PA-C in 6 months.                        - The findings and recommendations were discussed with                         the patient. Procedure Code(s):     --- Professional ---                        [phone], Esophagogastroduodenoscopy, flexible,                         transoral; with biopsy, single or  multiple Diagnosis Code(s):     --- Professional ---                        K90.0, Celiac disease                        K29.70, Gastritis, unspecified, without bleeding CPT copyright 2019 American Medical Association. All rights reserved. The codes documented in this report are preliminary and upon coder review may  be revised to meet current compliance requirements. Efrain Sella MD, MD 03/15/2021 10:40:37 AM This report has been signed electronically. Number of Addenda: 0 Note Initiated On: 03/15/2021 10:07 AM Estimated Blood Loss:  Estimated blood loss: none.      [facility]

## 2021-03-15 NOTE — Interval H&P Note (Signed)
History and Physical Interval Note:  03/15/2021 10:12 AM  Jeff Henderson  has presented today for surgery, with the diagnosis of adult celiac disease.  The various methods of treatment have been discussed with the patient and family. After consideration of risks, benefits and other options for treatment, the patient has consented to  Procedure(s): ESOPHAGOGASTRODUODENOSCOPY (EGD) WITH PROPOFOL (N/A) as a surgical intervention.  The patient's history has been reviewed, patient examined, no change in status, stable for surgery.  I have reviewed the patient's chart and labs.  Questions were answered to the patient's satisfaction.     [place], [place]

## 2021-03-15 NOTE — Anesthesia Preprocedure Evaluation (Addendum)
Anesthesia Evaluation  Patient identified by MRN, date of birth, ID band Patient awake    Reviewed: Allergy & Precautions, NPO status , Patient's Chart, lab work & pertinent test results  History of Anesthesia Complications Negative for: history of anesthetic complications  Airway Mallampati: III  TM Distance: >3 FB Neck ROM: Full    Dental  (+) Upper Dentures   Pulmonary neg pulmonary ROS, neg sleep apnea, neg COPD,    breath sounds clear to auscultation- rhonchi (-) wheezing      Cardiovascular hypertension, + CAD, + Past MI and + CABG  (-) Cardiac Stents  Rhythm:Regular Rate:Normal - Systolic murmurs and - Diastolic murmurs    Neuro/Psych neg Seizures negative neurological ROS  negative psych ROS   GI/Hepatic negative GI ROS, Neg liver ROS,   Endo/Other  negative endocrine ROSneg diabetes  Renal/GU CRFRenal disease     Musculoskeletal  (+) Arthritis ,   Abdominal (+) + obese,   Peds  Hematology  (+) anemia ,   Anesthesia Other Findings Past Medical History: No date: Arthritis No date: BPH (benign prostatic hyperplasia) No date: CAD (coronary artery disease)     Comment:  a. 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               06/2018 Cath: Native 3VD, 2/3 patent grafts (VG->OM2               100)->Med rx; c. 05/2019 NSTEMI/Cath:  LM nl, LAD 99ost,               140m LCX 1064mRCA 100p, LIMA->LAD ok, VG->RPDA ok (?               jump to OM), VG->OM not visualized (prev occluded). LVEDP              3043m-->Med Rx. No date: CKD (chronic kidney disease), stage III (HCC) No date: Gout No date: Gouty arthropathy, chronic, with tophi No date: Hernia, abdominal No date: HFrEF (heart failure with reduced ejection fraction) ([place]CC[place]   Comment:  a. 06/2018 Echo: EF 45-50%, mild MR; b. 05/2019 Echo: EF               30-35%, diff HK. No date: Hyperlipidemia LDL goal <70 No date: Hypertension No date:  Ischemic cardiomyopathy     Comment:  a. 06/2018 Echo: EF 45-50%; b. 05/2019 Echo: EF 30-35%,               diff HK. No date: MGUS (monoclonal gammopathy of unknown significance) No date: Myocardial infarction ([place]CC[place] date: PAF (paroxysmal atrial fibrillation) (HCC)     Comment:  a. Dx 05/2019-->CHA2DS2VASc = 5-->Eliquis. No date: Renal cyst   Reproductive/Obstetrics                            Anesthesia Physical Anesthesia Plan  ASA: III  Anesthesia Plan: General   Post-op Pain Management:    Induction: Intravenous  PONV Risk Score and Plan: 1 and Propofol infusion  Airway Management Planned: Natural Airway  Additional Equipment:   Intra-op Plan:   Post-operative Plan:   Informed Consent: I have reviewed the patients History and Physical, chart, labs and discussed the procedure including the risks, benefits and alternatives for the proposed anesthesia with the patient or authorized representative who has indicated his/her understanding and acceptance.     Dental advisory given  Plan Discussed with: CRNA and Anesthesiologist  Anesthesia Plan Comments:         Anesthesia Quick Evaluation

## 2021-03-15 NOTE — Anesthesia Procedure Notes (Signed)
Date/Time: 03/15/2021 10:22 AM Performed by: Lily Peer, Daveigh Batty, CRNA Pre-anesthesia Checklist: Patient identified, Emergency Drugs available, Suction available, Patient being monitored and Timeout performed Oxygen Delivery Method: Nasal cannula Induction Type: IV induction

## 2021-03-15 NOTE — Transfer of Care (Signed)
Immediate Anesthesia Transfer of Care Note  Patient: Sayeed Weatherall  Procedure(s) Performed: ESOPHAGOGASTRODUODENOSCOPY (EGD) WITH PROPOFOL (N/A )  Patient Location: Endoscopy Unit  Anesthesia Type:General  Level of Consciousness: drowsy  Airway & Oxygen Therapy: Patient Spontanous Breathing  Post-op Assessment: Report given to RN and Post -op Vital signs reviewed and stable  Post vital signs: Reviewed and stable  Last Vitals:  Vitals Value Taken Time  BP 123/64 03/15/21 1037  Temp    Pulse 67 03/15/21 1037  Resp 17 03/15/21 1037  SpO2 98 % 03/15/21 1037  Vitals shown include unvalidated device data.  Last Pain:  Vitals:   03/15/21 0857  TempSrc: Temporal  PainSc: 0-No pain         Complications: No complications documented.

## 2021-03-16 ENCOUNTER — Encounter: Payer: Self-pay | Admitting: Internal Medicine

## 2021-03-16 LAB — SURGICAL PATHOLOGY

## 2021-04-14 ENCOUNTER — Other Ambulatory Visit: Payer: Self-pay | Admitting: Cardiovascular Disease

## 2021-04-14 ENCOUNTER — Other Ambulatory Visit: Payer: Self-pay | Admitting: Family

## 2021-04-14 NOTE — Telephone Encounter (Signed)
Prescription refill request for Eliquis received. Indication: afib  Last office visit: Gilford Rile, 03/01/2021 Scr: 1.71, 12/15/2020 Age: 80 yo  Weight: 108.4 kg  Pt on the correct dose of Eliquis prescription refill sent for Eliquis 2.5 mg BID.

## 2021-04-25 ENCOUNTER — Ambulatory Visit (INDEPENDENT_AMBULATORY_CARE_PROVIDER_SITE_OTHER): Payer: Medicare Other

## 2021-04-25 ENCOUNTER — Other Ambulatory Visit: Payer: Self-pay

## 2021-04-25 DIAGNOSIS — I779 Disorder of arteries and arterioles, unspecified: Secondary | ICD-10-CM | POA: Diagnosis not present

## 2021-04-25 DIAGNOSIS — I6523 Occlusion and stenosis of bilateral carotid arteries: Secondary | ICD-10-CM

## 2021-04-27 ENCOUNTER — Other Ambulatory Visit: Payer: Self-pay

## 2021-04-27 DIAGNOSIS — I6523 Occlusion and stenosis of bilateral carotid arteries: Secondary | ICD-10-CM

## 2021-06-13 ENCOUNTER — Other Ambulatory Visit: Payer: Self-pay

## 2021-06-13 DIAGNOSIS — R768 Other specified abnormal immunological findings in serum: Secondary | ICD-10-CM

## 2021-06-14 ENCOUNTER — Other Ambulatory Visit: Payer: Medicare Other

## 2021-06-20 ENCOUNTER — Inpatient Hospital Stay: Payer: Medicare Other | Attending: Internal Medicine

## 2021-06-20 ENCOUNTER — Other Ambulatory Visit: Payer: Self-pay

## 2021-06-20 DIAGNOSIS — R768 Other specified abnormal immunological findings in serum: Secondary | ICD-10-CM

## 2021-06-20 DIAGNOSIS — Z79899 Other long term (current) drug therapy: Secondary | ICD-10-CM | POA: Insufficient documentation

## 2021-06-20 DIAGNOSIS — D631 Anemia in chronic kidney disease: Secondary | ICD-10-CM | POA: Diagnosis present

## 2021-06-20 DIAGNOSIS — N183 Chronic kidney disease, stage 3 unspecified: Secondary | ICD-10-CM | POA: Insufficient documentation

## 2021-06-20 LAB — CBC WITH DIFFERENTIAL/PLATELET
Abs Immature Granulocytes: 0.03 10*3/uL (ref 0.00–0.07)
Basophils Absolute: 0 10*3/uL (ref 0.0–0.1)
Basophils Relative: 1 %
Eosinophils Absolute: 0.2 10*3/uL (ref 0.0–0.5)
Eosinophils Relative: 3 %
HCT: 36 % — ABNORMAL LOW (ref 39.0–52.0)
Hemoglobin: 11.7 g/dL — ABNORMAL LOW (ref 13.0–17.0)
Immature Granulocytes: 1 %
Lymphocytes Relative: 16 %
Lymphs Abs: 1 10*3/uL (ref 0.7–4.0)
MCH: 31.3 pg (ref 26.0–34.0)
MCHC: 32.5 g/dL (ref 30.0–36.0)
MCV: 96.3 fL (ref 80.0–100.0)
Monocytes Absolute: 0.7 10*3/uL (ref 0.1–1.0)
Monocytes Relative: 10 %
Neutro Abs: 4.7 10*3/uL (ref 1.7–7.7)
Neutrophils Relative %: 69 %
Platelets: 180 10*3/uL (ref 150–400)
RBC: 3.74 MIL/uL — ABNORMAL LOW (ref 4.22–5.81)
RDW: 13.3 % (ref 11.5–15.5)
WBC: 6.7 10*3/uL (ref 4.0–10.5)
nRBC: 0 % (ref 0.0–0.2)

## 2021-06-20 LAB — BASIC METABOLIC PANEL
Anion gap: 8 (ref 5–15)
BUN: 38 mg/dL — ABNORMAL HIGH (ref 8–23)
CO2: 24 mmol/L (ref 22–32)
Calcium: 9.1 mg/dL (ref 8.9–10.3)
Chloride: 107 mmol/L (ref 98–111)
Creatinine, Ser: 1.97 mg/dL — ABNORMAL HIGH (ref 0.61–1.24)
GFR, Estimated: 34 mL/min — ABNORMAL LOW (ref >60)
Glucose, Bld: 98 mg/dL (ref 70–99)
Potassium: 4.3 mmol/L (ref 3.5–5.1)
Sodium: 139 mmol/L (ref 135–145)

## 2021-06-21 LAB — KAPPA/LAMBDA LIGHT CHAINS
Kappa free light chain: 51.9 mg/L — ABNORMAL HIGH (ref 3.3–19.4)
Kappa, lambda light chain ratio: 2.12 — ABNORMAL HIGH (ref 0.26–1.65)
Lambda free light chains: 24.5 mg/L (ref 5.7–26.3)

## 2021-07-11 ENCOUNTER — Other Ambulatory Visit: Payer: Self-pay | Admitting: Cardiovascular Disease

## 2021-08-01 ENCOUNTER — Other Ambulatory Visit: Payer: Self-pay

## 2021-08-01 ENCOUNTER — Encounter: Payer: Self-pay | Admitting: Podiatry

## 2021-08-01 ENCOUNTER — Ambulatory Visit (INDEPENDENT_AMBULATORY_CARE_PROVIDER_SITE_OTHER): Payer: Self-pay | Admitting: Podiatry

## 2021-08-01 DIAGNOSIS — M79672 Pain in left foot: Secondary | ICD-10-CM

## 2021-08-01 DIAGNOSIS — L989 Disorder of the skin and subcutaneous tissue, unspecified: Secondary | ICD-10-CM

## 2021-08-01 NOTE — Progress Notes (Signed)
HPI: 80 year old male presents for follow-up evaluation of calluses to the left foot.  They are very symptomatic and it feels like he is walking on glass.  Last time they were debrided and he felt significant relief for a long period of time.  If slowly return to the last 6 months.  Past Medical History:  Diagnosis Date   Arthritis    BPH (benign prostatic hyperplasia)    CAD (coronary artery disease)    a. 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 06/2018 Cath: Native 3VD, 2/3 patent grafts (VG->OM2 100)->Med rx; c. 05/2019 NSTEMI/Cath:  LM nl, LAD 99ost, 131m LCX 109mRCA 100p, LIMA->LAD ok, VG->RPDA ok (? jump to OM), VG->OM not visualized (prev occluded). LVEDP 3067m-->Med Rx.   CKD (chronic kidney disease), stage III (HCC)    Gout    Gouty arthropathy, chronic, with tophi    Hernia, abdominal    HFrEF (heart failure with reduced ejection fraction) ([place]CC[place]  a. 06/2018 Echo: EF 45-50%, mild MR; b. 05/2019 Echo: EF 30-35%, diff HK.   Hyperlipidemia LDL goal <70    Hypertension    Ischemic cardiomyopathy    a. 06/2018 Echo: EF 45-50%; b. 05/2019 Echo: EF 30-35%, diff HK.   MGUS (monoclonal gammopathy of unknown significance)    Myocardial infarction (HCC)    PAF (paroxysmal atrial fibrillation) ([place]  a. Dx 05/2019-->CHA2DS2VASc = 5-->Eliquis.   Renal cyst      Physical Exam: General: The patient is alert and oriented x3 in no acute distress.  Dermatology: Skin is warm, dry and supple bilateral lower extremities. Negative for open lesions or macerations.  Vascular: Palpable pedal pulses bilaterally. No edema or erythema noted. Capillary refill within normal limits.  Neurological: Epicritic and protective threshold grossly intact bilaterally.   Musculoskeletal Exam: Range of motion within normal limits to all pedal and ankle joints bilateral. Muscle strength 5/5 in all groups bilateral.  There is a non-adhered palpable nodule noted just proximal to the insertion of the  Achilles tendon onto the calcaneus.  Nodule measures approximately 1.5 cm in diameter.  Radiographic Exam taken last visit:  Normal osseous mineralization.  Joint space narrowing with degenerative joint disease throughout the joints of the foot.  Intratendinous calcifications noted to the Achilles tendon right lower extremity best visualized on lateral view.  Significant changes since prior exam taken in 2016.  Large calcific portion of the calcaneus appears to be somewhat detached as well as smaller portions of the calcific tendon.  This may demonstrate partial tear with detachment from the insertion of the Achilles tendon onto the calcaneus.  Assessment: 1. Achilles tendinitis right - significantly improved over the past week 2. Calcific achilles RLE   Plan of Care:  1. Patient evaluated. X-Rays reviewed.  2.  Since there is significant improvement over the past week we will continue with observation and conservative modalities.  Pain is also minimal. 3.  Silicone toe sleeve dispensed today 4.  Recommend good supportive athletic sneakers 5.  Return to clinic as needed      BreEdrick KinsPM [facility]  Dr. BreEdrick KinsPM    [address] [id number]  Office [phone]  Fax [phone]

## 2021-08-07 ENCOUNTER — Inpatient Hospital Stay
Admission: EM | Admit: 2021-08-07 | Discharge: 2021-08-10 | DRG: 280 | Disposition: A | Discharge status: Home / Self Care | Payer: Medicare Other | Attending: Internal Medicine | Admitting: Internal Medicine

## 2021-08-07 ENCOUNTER — Emergency Department: Payer: Medicare Other

## 2021-08-07 DIAGNOSIS — I952 Hypotension due to drugs: Secondary | ICD-10-CM

## 2021-08-07 DIAGNOSIS — Z951 Presence of aortocoronary bypass graft: Secondary | ICD-10-CM | POA: Diagnosis not present

## 2021-08-07 DIAGNOSIS — Z6833 Body mass index (BMI) 33.0-33.9, adult: Secondary | ICD-10-CM

## 2021-08-07 DIAGNOSIS — I480 Paroxysmal atrial fibrillation: Secondary | ICD-10-CM | POA: Diagnosis present

## 2021-08-07 DIAGNOSIS — I252 Old myocardial infarction: Secondary | ICD-10-CM | POA: Diagnosis not present

## 2021-08-07 DIAGNOSIS — I5023 Acute on chronic systolic (congestive) heart failure: Secondary | ICD-10-CM

## 2021-08-07 DIAGNOSIS — E669 Obesity, unspecified: Secondary | ICD-10-CM | POA: Diagnosis present

## 2021-08-07 DIAGNOSIS — D472 Monoclonal gammopathy: Secondary | ICD-10-CM | POA: Diagnosis present

## 2021-08-07 DIAGNOSIS — I2510 Atherosclerotic heart disease of native coronary artery without angina pectoris: Secondary | ICD-10-CM | POA: Diagnosis present

## 2021-08-07 DIAGNOSIS — M1A9XX1 Chronic gout, unspecified, with tophus (tophi): Secondary | ICD-10-CM | POA: Diagnosis present

## 2021-08-07 DIAGNOSIS — Z79899 Other long term (current) drug therapy: Secondary | ICD-10-CM

## 2021-08-07 DIAGNOSIS — I214 Non-ST elevation (NSTEMI) myocardial infarction: Secondary | ICD-10-CM | POA: Diagnosis present

## 2021-08-07 DIAGNOSIS — N400 Enlarged prostate without lower urinary tract symptoms: Secondary | ICD-10-CM | POA: Diagnosis present

## 2021-08-07 DIAGNOSIS — U071 COVID-19: Secondary | ICD-10-CM | POA: Diagnosis present

## 2021-08-07 DIAGNOSIS — Z8249 Family history of ischemic heart disease and other diseases of the circulatory system: Secondary | ICD-10-CM

## 2021-08-07 DIAGNOSIS — N1832 Chronic kidney disease, stage 3b: Secondary | ICD-10-CM | POA: Diagnosis present

## 2021-08-07 DIAGNOSIS — I472 Ventricular tachycardia: Secondary | ICD-10-CM

## 2021-08-07 DIAGNOSIS — I482 Chronic atrial fibrillation, unspecified: Secondary | ICD-10-CM | POA: Diagnosis present

## 2021-08-07 DIAGNOSIS — R079 Chest pain, unspecified: Secondary | ICD-10-CM | POA: Diagnosis not present

## 2021-08-07 DIAGNOSIS — E785 Hyperlipidemia, unspecified: Secondary | ICD-10-CM | POA: Diagnosis present

## 2021-08-07 DIAGNOSIS — I25118 Atherosclerotic heart disease of native coronary artery with other forms of angina pectoris: Secondary | ICD-10-CM | POA: Diagnosis not present

## 2021-08-07 DIAGNOSIS — N183 Chronic kidney disease, stage 3 unspecified: Secondary | ICD-10-CM | POA: Diagnosis present

## 2021-08-07 DIAGNOSIS — Z880 Allergy status to penicillin: Secondary | ICD-10-CM | POA: Diagnosis not present

## 2021-08-07 DIAGNOSIS — I25810 Atherosclerosis of coronary artery bypass graft(s) without angina pectoris: Secondary | ICD-10-CM | POA: Diagnosis not present

## 2021-08-07 DIAGNOSIS — Z96653 Presence of artificial knee joint, bilateral: Secondary | ICD-10-CM | POA: Diagnosis present

## 2021-08-07 DIAGNOSIS — Z7901 Long term (current) use of anticoagulants: Secondary | ICD-10-CM | POA: Diagnosis not present

## 2021-08-07 DIAGNOSIS — I255 Ischemic cardiomyopathy: Secondary | ICD-10-CM | POA: Diagnosis present

## 2021-08-07 DIAGNOSIS — N189 Chronic kidney disease, unspecified: Secondary | ICD-10-CM | POA: Diagnosis not present

## 2021-08-07 DIAGNOSIS — I959 Hypotension, unspecified: Secondary | ICD-10-CM

## 2021-08-07 DIAGNOSIS — I4891 Unspecified atrial fibrillation: Secondary | ICD-10-CM | POA: Diagnosis not present

## 2021-08-07 DIAGNOSIS — I130 Hypertensive heart and chronic kidney disease with heart failure and stage 1 through stage 4 chronic kidney disease, or unspecified chronic kidney disease: Secondary | ICD-10-CM | POA: Diagnosis present

## 2021-08-07 DIAGNOSIS — R001 Bradycardia, unspecified: Secondary | ICD-10-CM | POA: Diagnosis not present

## 2021-08-07 DIAGNOSIS — I4729 Other ventricular tachycardia: Secondary | ICD-10-CM

## 2021-08-07 LAB — CBC WITH DIFFERENTIAL/PLATELET
Abs Immature Granulocytes: 0.16 10*3/uL — ABNORMAL HIGH (ref 0.00–0.07)
Basophils Absolute: 0 10*3/uL (ref 0.0–0.1)
Basophils Relative: 0 %
Eosinophils Absolute: 0 10*3/uL (ref 0.0–0.5)
Eosinophils Relative: 0 %
HCT: 37.9 % — ABNORMAL LOW (ref 39.0–52.0)
Hemoglobin: 12.5 g/dL — ABNORMAL LOW (ref 13.0–17.0)
Immature Granulocytes: 1 %
Lymphocytes Relative: 5 %
Lymphs Abs: 0.7 10*3/uL (ref 0.7–4.0)
MCH: 32.6 pg (ref 26.0–34.0)
MCHC: 33 g/dL (ref 30.0–36.0)
MCV: 98.7 fL (ref 80.0–100.0)
Monocytes Absolute: 0.8 10*3/uL (ref 0.1–1.0)
Monocytes Relative: 6 %
Neutro Abs: 11.6 10*3/uL — ABNORMAL HIGH (ref 1.7–7.7)
Neutrophils Relative %: 88 %
Platelets: 212 10*3/uL (ref 150–400)
RBC: 3.84 MIL/uL — ABNORMAL LOW (ref 4.22–5.81)
RDW: 13.7 % (ref 11.5–15.5)
WBC: 13.3 10*3/uL — ABNORMAL HIGH (ref 4.0–10.5)
nRBC: 0 % (ref 0.0–0.2)

## 2021-08-07 LAB — COMPREHENSIVE METABOLIC PANEL
ALT: 48 U/L — ABNORMAL HIGH (ref 0–44)
AST: 48 U/L — ABNORMAL HIGH (ref 15–41)
Albumin: 3.7 g/dL (ref 3.5–5.0)
Alkaline Phosphatase: 46 U/L (ref 38–126)
Anion gap: 8 (ref 5–15)
BUN: 50 mg/dL — ABNORMAL HIGH (ref 8–23)
CO2: 25 mmol/L (ref 22–32)
Calcium: 8.9 mg/dL (ref 8.9–10.3)
Chloride: 107 mmol/L (ref 98–111)
Creatinine, Ser: 1.78 mg/dL — ABNORMAL HIGH (ref 0.61–1.24)
GFR, Estimated: 38 mL/min — ABNORMAL LOW (ref >60)
Glucose, Bld: 196 mg/dL — ABNORMAL HIGH (ref 70–99)
Potassium: 4.6 mmol/L (ref 3.5–5.1)
Sodium: 140 mmol/L (ref 135–145)
Total Bilirubin: 0.9 mg/dL (ref 0.3–1.2)
Total Protein: 6.7 g/dL (ref 6.5–8.1)

## 2021-08-07 LAB — PROTIME-INR
INR: 1.1 (ref 0.8–1.2)
Prothrombin Time: 14.1 seconds (ref 11.4–15.2)

## 2021-08-07 LAB — TSH: TSH: 0.584 u[IU]/mL (ref 0.350–4.500)

## 2021-08-07 LAB — TROPONIN I (HIGH SENSITIVITY)
Troponin I (High Sensitivity): 324 ng/L (ref <18)
Troponin I (High Sensitivity): 1725 ng/L (ref <18)

## 2021-08-07 LAB — RESP PANEL BY RT-PCR (FLU A&B, COVID) ARPGX2
Influenza A by PCR: NEGATIVE
Influenza B by PCR: NEGATIVE
SARS Coronavirus 2 by RT PCR: POSITIVE — AB

## 2021-08-07 LAB — MAGNESIUM: Magnesium: 2.1 mg/dL (ref 1.7–2.4)

## 2021-08-07 LAB — APTT: aPTT: 29 seconds (ref 24–36)

## 2021-08-07 MED ORDER — NITROGLYCERIN 0.4 MG SL SUBL: 0.4000 mg | SUBLINGUAL_TABLET | SUBLINGUAL | Status: DC | PRN

## 2021-08-07 MED ORDER — HEPARIN SODIUM (PORCINE) 5000 UNIT/ML IJ SOLN
4000.0000 [IU] | Freq: Once | INTRAMUSCULAR | Status: AC
  Administered 2021-08-07: 4000 [IU] via INTRAVENOUS

## 2021-08-07 MED ORDER — FUROSEMIDE 40 MG PO TABS: 20.0000 mg | ORAL_TABLET | Freq: Every day | ORAL | Status: DC

## 2021-08-07 MED ORDER — DILTIAZEM HCL 25 MG/5ML IV SOLN
20.0000 mg | Freq: Once | INTRAVENOUS | Status: AC
  Administered 2021-08-07: 20 mg via INTRAVENOUS
  Filled 2021-08-07: qty 5

## 2021-08-07 MED ORDER — CARVEDILOL 3.125 MG PO TABS
3.1250 mg | ORAL_TABLET | Freq: Two times a day (BID) | ORAL | Status: DC
  Administered 2021-08-09 – 2021-08-10 (×3): 3.125 mg via ORAL
  Filled 2021-08-07 (×3): qty 1

## 2021-08-07 MED ORDER — HEPARIN SODIUM (PORCINE) 5000 UNIT/ML IJ SOLN
INTRAMUSCULAR | Status: AC
  Filled 2021-08-07: qty 1

## 2021-08-07 MED ORDER — CARVEDILOL 25 MG PO TABS: 25.0000 mg | ORAL_TABLET | Freq: Two times a day (BID) | ORAL | Status: DC

## 2021-08-07 MED ORDER — FUROSEMIDE 40 MG PO TABS
20.0000 mg | ORAL_TABLET | Freq: Every day | ORAL | Status: DC
  Filled 2021-08-07: qty 1

## 2021-08-07 MED ORDER — SODIUM CHLORIDE 0.9% FLUSH
3.0000 mL | Freq: Two times a day (BID) | INTRAVENOUS | Status: DC
  Administered 2021-08-08 – 2021-08-10 (×4): 3 mL via INTRAVENOUS

## 2021-08-07 MED ORDER — AMIODARONE HCL IN DEXTROSE 360-4.14 MG/200ML-% IV SOLN
30.0000 mg/h | INTRAVENOUS | Status: DC
  Administered 2021-08-07 – 2021-08-08 (×2): 30 mg/h via INTRAVENOUS
  Filled 2021-08-07 (×2): qty 200

## 2021-08-07 MED ORDER — SODIUM CHLORIDE 0.9% FLUSH: 3.0000 mL | INTRAVENOUS | Status: DC | PRN

## 2021-08-07 MED ORDER — ASPIRIN 81 MG PO CHEW
81.0000 mg | CHEWABLE_TABLET | ORAL | Status: AC
  Administered 2021-08-08: 81 mg via ORAL
  Filled 2021-08-07: qty 1

## 2021-08-07 MED ORDER — LOSARTAN POTASSIUM 50 MG PO TABS: 25.0000 mg | ORAL_TABLET | Freq: Every day | ORAL | Status: DC

## 2021-08-07 MED ORDER — SODIUM CHLORIDE 0.9 % IV SOLN: INTRAVENOUS | Status: DC

## 2021-08-07 MED ORDER — AMIODARONE LOAD VIA INFUSION
150.0000 mg | Freq: Once | INTRAVENOUS | Status: AC
  Administered 2021-08-07: 150 mg via INTRAVENOUS
  Filled 2021-08-07: qty 83.34

## 2021-08-07 MED ORDER — HEPARIN (PORCINE) 25000 UT/250ML-% IV SOLN
1250.0000 [IU]/h | INTRAVENOUS | Status: DC
  Administered 2021-08-07: 1250 [IU]/h via INTRAVENOUS
  Filled 2021-08-07 (×2): qty 250

## 2021-08-07 MED ORDER — FUROSEMIDE 10 MG/ML IJ SOLN: 60.0000 mg | Freq: Once | INTRAMUSCULAR | Status: AC

## 2021-08-07 MED ORDER — LOSARTAN POTASSIUM 50 MG PO TABS: 50.0000 mg | ORAL_TABLET | Freq: Every day | ORAL | Status: DC

## 2021-08-07 MED ORDER — EZETIMIBE 10 MG PO TABS
10.0000 mg | ORAL_TABLET | Freq: Every day | ORAL | Status: DC
  Administered 2021-08-09 – 2021-08-10 (×2): 10 mg via ORAL
  Filled 2021-08-07 (×4): qty 1

## 2021-08-07 MED ORDER — AMIODARONE HCL IN DEXTROSE 360-4.14 MG/200ML-% IV SOLN
60.0000 mg/h | INTRAVENOUS | Status: AC
  Administered 2021-08-07 (×2): 60 mg/h via INTRAVENOUS

## 2021-08-07 MED ORDER — ATORVASTATIN CALCIUM 20 MG PO TABS
80.0000 mg | ORAL_TABLET | Freq: Every day | ORAL | Status: DC
  Filled 2021-08-07: qty 4

## 2021-08-07 MED ORDER — ETOMIDATE 2 MG/ML IV SOLN
15.0000 mg | Freq: Once | INTRAVENOUS | Status: DC
  Filled 2021-08-07: qty 10

## 2021-08-07 MED ORDER — SODIUM CHLORIDE 0.9 % IV SOLN: 250.0000 mL | INTRAVENOUS | Status: DC | PRN

## 2021-08-07 MED ORDER — CARVEDILOL 6.25 MG PO TABS
6.2500 mg | ORAL_TABLET | Freq: Once | ORAL | Status: AC
  Administered 2021-08-07: 6.25 mg via ORAL
  Filled 2021-08-07: qty 1

## 2021-08-07 MED ORDER — FUROSEMIDE 10 MG/ML IJ SOLN
INTRAMUSCULAR | Status: AC
  Administered 2021-08-07: 60 mg via INTRAVENOUS
  Filled 2021-08-07: qty 8

## 2021-08-07 MED ORDER — ASPIRIN 81 MG PO CHEW
81.0000 mg | CHEWABLE_TABLET | Freq: Every day | ORAL | Status: DC
  Administered 2021-08-09 – 2021-08-10 (×2): 81 mg via ORAL
  Filled 2021-08-07 (×2): qty 1

## 2021-08-07 MED ORDER — ISOSORBIDE MONONITRATE ER 60 MG PO TB24: 60.0000 mg | ORAL_TABLET | Freq: Every day | ORAL | Status: DC

## 2021-08-07 NOTE — ED Notes (Signed)
Patient reported SOB. New symptom. Increased [place] from 2L to 4L. Increased WOB and patient leaning forward. Denies chest pain. BP 180/114. Heart rate ST w/frequent PACs, was NSR with occasional PACs and PVcs. EKG done. Notified charge and ED provider. Orders received. Lasix 60 given. After 10 minutes on Bipap patient reported he feels much better.

## 2021-08-07 NOTE — H&P (Addendum)
History and Physical    [name] ZDG:[id number] DOB: 1940-12-30 DOA: 08/07/2021  PCP: [facility]  Patient coming from: Home  I have personally briefly reviewed patient's old medical records in [place]  Chief Complaint: Chest pain  HPI: Jeff Henderson is a 80 y.o. male with medical history significant for remote history CAD s/p CABG,HFrEF, atrial fibrillation on Eliquis, CKD stage III, hyperlipidemia who presents with concerns of chest pain.  Patient normally walks on his treadmill at home about 3 times weekly.  Today he was on the treadmill about 20 to 30 minutes and then around 9:30 AM he began to feel his heart racing and left-sided chest pain about a 6 out of 10.  He tried to sit and rest without relief so drove himself to the fire station for EMS.  He denies any shortness of breath, or coughing.  No nausea, vomiting or diarrhea.  In the ER, he presented in atrial fibrillation with RVR with rates up to 150s to 170s with blood pressure of 112/96.  He was given IV 20 mg diltiazem bolus and had transient hypotension down to 80 over 50s that resolved spontaneously.  He was then noted to have elevated heart rate up to 170 briefly and had EKG findings of ventricular tachycardia.  Troponin resulted at 320.  He was placed on IV amiodarone bolus and infusion.  Also started on IV heparin for NSTEMI.  Initially there was plans to take patient to Cath Lab emergently but cardiology evaluated patient at the bedside and given improvement of his chest pain they have plan for left heart cath tomorrow morning.  Hospitalist on-call for admission.  Review of Systems:  Constitutional: No Weight Change, No Fever ENT/Mouth: No sore throat, No Rhinorrhea Eyes: No Eye Pain, No Vision Changes Cardiovascular: +Chest Pain, no SOB, No PND, No Dyspnea on Exertion, No Orthopnea, No Edema, No Palpitations Respiratory: No Cough, No Sputum, No Wheezing, no Dyspnea   Gastrointestinal: No Nausea, No Vomiting, No Diarrhea, No Constipation, No Pain Genitourinary: no Urinary Incontinence Musculoskeletal: No Arthralgias, No Myalgias Skin: No Skin Lesions, No Pruritus, Neuro: no Weakness, No Numbness,  No Loss of Consciousness, No Syncope Psych: No Anxiety/Panic, No Depression, no decrease appetite Heme/Lymph: No Bruising, No Bleeding  Past Medical History:  Diagnosis Date   Arthritis    BPH (benign prostatic hyperplasia)    CAD (coronary artery disease)    a. 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 06/2018 Cath: Native 3VD, 2/3 patent grafts (VG->OM2 100)->Med rx; c. 05/2019 NSTEMI/Cath:  LM nl, LAD 99ost, 139m LCX 1045mRCA 100p, LIMA->LAD ok, VG->RPDA ok (? jump to OM), VG->OM not visualized (prev occluded). LVEDP 3056m-->Med Rx.   CKD (chronic kidney disease), stage III (HCC)    Gout    Gouty arthropathy, chronic, with tophi    Hernia, abdominal    HFrEF (heart failure with reduced ejection fraction) [place]  a. 06/2018 Echo: EF 45-50%, mild MR; b. 05/2019 Echo: EF 30-35%, diff HK.   Hyperlipidemia LDL goal <70    Hypertension    Ischemic cardiomyopathy    a. 06/2018 Echo: EF 45-50%; b. 05/2019 Echo: EF 30-35%, diff HK.   MGUS (monoclonal gammopathy of unknown significance)    Myocardial infarction (HCC)    PAF (paroxysmal atrial fibrillation) ([facility]  a. Dx 05/2019-->CHA2DS2VASc = 5-->Eliquis.   Renal cyst     Past Surgical History:  Procedure Laterality Date   CARDIAC CATHETERIZATION  carpel tunnel     COLONOSCOPY WITH PROPOFOL N/A 01/14/2020   Procedure: COLONOSCOPY WITH PROPOFOL;  Surgeon: Toledo, Benay Pike, MD;  Location: [facility];  Service: Gastroenterology;  Laterality: N/A;   CORONARY ARTERY BYPASS GRAFT     ESOPHAGOGASTRODUODENOSCOPY (EGD) WITH PROPOFOL N/A 01/14/2020   Procedure: ESOPHAGOGASTRODUODENOSCOPY (EGD) WITH PROPOFOL;  Surgeon: Toledo, Benay Pike, MD;  Location: [facility]COPY;  Service: Gastroenterology;   Laterality: N/A;   ESOPHAGOGASTRODUODENOSCOPY (EGD) WITH PROPOFOL N/A 03/15/2021   Procedure: ESOPHAGOGASTRODUODENOSCOPY (EGD) WITH PROPOFOL;  Surgeon: Toledo, Benay Pike, MD;  Location: [facility]COPY;  Service: Gastroenterology;  Laterality: N/A;   JOINT REPLACEMENT Bilateral 2002  2011   LEFT HEART CATH AND CORONARY ANGIOGRAPHY N/A 06/03/2018   Procedure: LEFT HEART CATH AND CORS/GRAFTS ANGIOGRAPHY;  Surgeon: Isaias Cowman, MD;  Location: [place] [facility];  Service: Cardiovascular;  Laterality: N/A;   LEFT HEART CATH AND CORS/GRAFTS ANGIOGRAPHY Bilateral 05/07/2019   Procedure: LEFT HEART CATH AND CORS/GRAFTS ANGIOGRAPHY;  Surgeon: Minna Merritts, MD;  Location: [place] [facility];  Service: Cardiovascular;  Laterality: Bilateral;   REPLACEMENT TOTAL KNEE     VEIN BYPASS SURGERY       reports that he has never smoked. He has never used smokeless tobacco. He reports that he does not drink alcohol and does not use drugs. Social History  Allergies  Allergen Reactions   Penicillins Rash and Other (See Comments)    Has patient had a PCN reaction causing immediate rash, facial/tongue/throat swelling, SOB or lightheadedness with hypotension: No Has patient had a PCN reaction causing severe rash involving mucus membranes or skin necrosis: No Has patient had a PCN reaction that required hospitalization: No Has patient had a PCN reaction occurring within the last 10 years: No If all of the above answers are "NO", then may proceed with Cephalosporin use.     Family History  Problem Relation Age of Onset   Asthma Mother    Diabetes Father    Hypertension Father      Prior to Admission medications   Medication Sig Start Date End Date Taking? Authorizing Provider  amLODipine-benazepril (LOTREL) 5-20 MG capsule Take 1 capsule by mouth daily. 08/26/20 08/26/21 Yes [provider]  atorvastatin (LIPITOR) 80 MG tablet Take 80 mg by mouth daily.    Yes [provider]   carvedilol (COREG) 25 MG tablet Take 1 tablet (25 mg total) by mouth 2 (two) times daily with a meal. 05/13/19  Yes Theora Gianotti, NP  Coenzyme Q10 (COQ10) 100 MG CAPS Take 100 mg by mouth daily.   Yes [provider]  ELIQUIS 2.5 MG TABS tablet TAKE 1 TABLET(2.5 MG) BY MOUTH TWICE DAILY 04/14/21  Yes Wellington Hampshire, MD  ezetimibe (ZETIA) 10 MG tablet TAKE 1 TABLET(10 MG) BY MOUTH DAILY 02/24/21  Yes Wellington Hampshire, MD  famotidine (PEPCID) 20 MG tablet Take 20 mg by mouth 2 (two) times daily.   Yes [provider]  furosemide (LASIX) 20 MG tablet TAKE 1 TABLET(20 MG) BY MOUTH DAILY 04/14/21  Yes Loel Dubonnet, NP  isosorbide mononitrate (IMDUR) 60 MG 24 hr tablet TAKE 1 TABLET(60 MG) BY MOUTH DAILY 07/11/21  Yes Wellington Hampshire, MD  Multiple Vitamins-Minerals (MENS 50+ MULTI VITAMIN/MIN) TABS Take 1 tablet by mouth daily.    Yes [provider]  Omega-3 1000 MG CAPS Take 1,000 mg by mouth daily.    Yes [provider]  predniSONE (STERAPRED UNI-PAK 21 TAB) 10 MG (21)  TBPK tablet See admin instructions. follow package directions 07/31/21  Yes [provider]  nitroGLYCERIN (NITROSTAT) 0.4 MG SL tablet Place 1 tablet (0.4 mg total) under the tongue every 5 (five) minutes as needed for chest pain (Nitro if taking third dose, call 911). 05/08/19   Marrianne Mood D, PA-C    Physical Exam: Vitals:   08/07/21 1314 08/07/21 1315 08/07/21 1330 08/07/21 1357  BP: 101/72 110/69 118/74   Pulse:   70   Resp: 14 13 (!) 23   Temp:      TempSrc:      SpO2:   100%   Weight:    104.3 kg    Constitutional: NAD, calm, comfortable, elderly male lying flat in bed Vitals:   08/07/21 1314 08/07/21 1315 08/07/21 1330 08/07/21 1357  BP: 101/72 110/69 118/74   Pulse:   70   Resp: 14 13 (!) 23   Temp:      TempSrc:      SpO2:   100%   Weight:    104.3 kg   Eyes: PERRL, lids and conjunctivae normal ENMT: Mucous membranes are moist.  Neck:  normal, supple Respiratory: clear to auscultation bilaterally, no wheezing, no crackles. Normal respiratory effort. No accessory muscle use.  Cardiovascular: Regular rate and rhythm, no murmurs / rubs / gallops. No extremity edema. 2+ pedal pulses. No carotid bruits.  Abdomen: no tenderness, no masses palpated.  Bowel sounds positive.  Musculoskeletal: no clubbing / cyanosis. No joint deformity upper and lower extremities. Good ROM, no contractures. Normal muscle tone.  Skin: no rashes, lesions, ulcers. No induration Neurologic: CN 2-12 grossly intact. Sensation intact, Strength 5/5 in all 4.  Psychiatric: Normal judgment and insight. Alert and oriented x 3. Normal mood.     Labs on Admission: I have personally reviewed following labs and imaging studies  CBC: Recent Labs  Lab 08/07/21 1122  WBC 13.3*  NEUTROABS 11.6*  HGB 12.5*  HCT 37.9*  MCV 98.7  PLT 378   Basic Metabolic Panel: Recent Labs  Lab 08/07/21 1122  NA 140  K 4.6  CL 107  CO2 25  GLUCOSE 196*  BUN 50*  CREATININE 1.78*  CALCIUM 8.9  MG 2.1   GFR: Estimated Creatinine Clearance: 38.7 mL/min (A) (by C-G formula based on SCr of 1.78 mg/dL (H)). Liver Function Tests: Recent Labs  Lab 08/07/21 1122  AST 48*  ALT 48*  ALKPHOS 46  BILITOT 0.9  PROT 6.7  ALBUMIN 3.7   No results for input(s): LIPASE, AMYLASE in the last 168 hours. No results for input(s): AMMONIA in the last 168 hours. Coagulation Profile: Recent Labs  Lab 08/07/21 1122  INR 1.1   Cardiac Enzymes: No results for input(s): CKTOTAL, CKMB, CKMBINDEX, TROPONINI in the last 168 hours. BNP (last 3 results) No results for input(s): PROBNP in the last 8760 hours. HbA1C: No results for input(s): HGBA1C in the last 72 hours. CBG: No results for input(s): GLUCAP in the last 168 hours. Lipid Profile: No results for input(s): CHOL, HDL, LDLCALC, TRIG, CHOLHDL, LDLDIRECT in the last 72 hours. Thyroid Function Tests: Recent Labs     08/07/21 1326  TSH 0.584   Anemia Panel: No results for input(s): VITAMINB12, FOLATE, FERRITIN, TIBC, IRON, RETICCTPCT in the last 72 hours. Urine analysis: No results found for: COLORURINE, APPEARANCEUR, [place], [place], GLUCOSEU, HGBUR, BILIRUBINUR, KETONESUR, PROTEINUR, UROBILINOGEN, NITRITE, LEUKOCYTESUR  Radiological Exams on Admission: DG Chest Portable 1 View  Result Date: 08/07/2021 CLINICAL DATA:  Chest pain  EXAM: PORTABLE CHEST 1 VIEW COMPARISON:  Chest radiograph dated 05/06/2019. FINDINGS: The heart is enlarged. Median sternotomy wires are redemonstrated. Vascular calcifications are seen in the aortic arch. The left costophrenic angle is obscured which may reflect a trace pleural effusion with associated atelectasis/airspace disease. The right lung is clear with no pleural effusion. There is no pneumothorax. Degenerative changes are seen in the spine. IMPRESSION: Possible trace left pleural effusion with associated atelectasis/airspace disease. Aortic Atherosclerosis (ICD10-I70.0). Electronically Signed   By: Zerita Boers M.D.   On: 08/07/2021 11:49      Assessment/Plan  NSTEMI -Troponin of 342 --> 1725. Continue to trend for peak. -planned LHC with cardiology tomorrow -NPO after midnight -continue IV heparin. Hold Eliquis.  Atrial fibrillation with RVR/non-sustained ventricular tachycardia  -continue IV amiodarone . Pt had recurrent episode of nonsustained 14 beat V. tach after initiation of IV amiodarone.  Discussed with cardiology Dr. Acie Fredrickson who did not advise any additional antiarrhythmics as long as patient remains asymptomatic. -TSH pending  -continue low dose Coreg per cardiology   Hypotension -Transient hypotension down to systolic of 37T following IV 20 mg diltiazem bolus.  Resolved  -Continue low-dose Coreg -Cardiology has recommended losartan and Imdur.  Will initiate those tomorrow since patient already had his antihypertensives this morning  Acute on  chronic CHF -last echo on 12/2019 EF of 35-40%.  Repeat echocardiogram pending. - cardiology had discontinued amlodipine-benazepril to start washout and plans to later initiate Entresto -Start losartan tomorrow  Hyperlipidemia - Continue statin and Zetia  CKD stage 3  -stable around baseline 1.7  COVID 19 viral infection -Asymptomatic.  Pt has already received 2-dose vaccine and 2 boosters -Monitor clinically from a -Airborne and contact precaution  DVT prophylaxis:.Lovenox Code Status: Full Family Communication: Plan discussed with patient and Grandson at bedside  disposition Plan: Home with at least 2 midnight stays  Consults called: cardiology Admission status: inpatient   Level of care: Stepdown  Status is: Inpatient  Remains inpatient appropriate because:Inpatient level of care appropriate due to severity of illness  Dispo: The patient is from: Home              Anticipated d/c is to: Home              Patient currently is not medically stable to d/c.   Difficult to place patient No         Orene Desanctis DO [facility]   If 7PM-7AM, please contact night-coverage [email or web address]   08/07/2021, 3:07 PM

## 2021-08-07 NOTE — ED Notes (Signed)
Patient denies pain and is resting comfortably.  

## 2021-08-07 NOTE — Consult Note (Addendum)
Cardiology Consultation:   Patient ID: Jeff Henderson MRN: [id number]; DOB: 1941-03-07  Admit date: 08/07/2021 Date of Consult: 08/07/2021  PCP:  [facility]  Cardiologist:  [name], MD  Advanced Practice Provider:  No care team member to display Electrophysiologist:  None   Patient Profile:   Jeff Henderson is a 80 y.o. male with a hx of coronary artery disease s/p CABG 5784, chronic systolic heart failure, paroxysmal atrial fibrillation on Eliquis, hypertension, subclavian stenosis, hyperlipidemia, carotid artery disease, MGUS, CKD 3, gout, BPH, and who is being seen today for the evaluation of NSTEMI and ventricular tachycardia COVID-19 positive at the request of Dr. Starleen Blue.  History of Present Illness:   Jeff Henderson is an 80 year old male with PMH as above.  [facility] is [facility] with [facility] Dr. Fletcher Anon.  He was last seen in office 03/01/2021 by Laurann Montana, NP.  He does not drink alcohol or smoke tobacco.  No illegal drugs.  No family history of early cardiac death.  He has a history of CAD s/p CABG in 1995.  Subclavian artery stenosis identified during [facility] 2011.  He was admitted to [facility] 05/2018 for non-STEMI and subsequent LHC showed severe 3v CAD including CTO to the pRCA and m LAD, chronically occluded dLCx, grafted OM.  90%s ostial LAD, supplying 2 small diagonal branches.  EF 45 to 50% on echo.  Admitted 05/2019 with chest pain in the setting of new atrial fibrillation with RVR.  Self converted to NSR.  Repeat LHC without change in anatomy.  He was started on Eliquis.  Echo showed EF 30 to 35%.  When last seen in the office, he was doing well from a cardiac standpoint and reported exercise 3 times per week.  He presents today with chest pain and elevated troponin.  He was in his usual state of health and had just completed a workout on the treadmill.  No CP during his workout. He had some water and a granola bar.  He  then took his prednisone.  Approximately 45 minutes later, he felt substernal / dull chest pain in the center of his chest with some radiation to the left shoulder.  CP 7/10 in severity and persisted approximately 30 minutes.  CP has waxed and waned since then, though improved in severity, and currently rated 3/10 in severity.  Associated symptoms included racing heart rate and palpitations.  No shortness of breath, diaphoresis, presyncope. No recent s/sx of volume overload.  He reports compliance with medications, including his Eliquis.  No signs or symptoms of bleeding.    In the emergency department, he was noted to be COVID-19 positive.  He denies any symptoms associated with COVID-19.  Labs showed Cr 1.78 and at baseline with known history of CKD 3, K 4.6, elevated AST/ALT of 48, HS Tn 324 and still rounding.  Initial EKG showed atrial fibrillation with RVR.  He then briefly went into ventricular tachycardia but has since converted back to atrial fibrillation with RVR and frequent ectopy.  He has been started on IV amiodarone and received a one-time dose of diltiazem 20 mg. He has been started on IV   At the time of our consultation, he denies chest pain. He continues to deny COVID19 sx.  Past Medical History:  Diagnosis Date   Arthritis    BPH (benign prostatic hyperplasia)    CAD (coronary artery disease)    a. 1995 s/p CABG x 3 (VG->RPDA, VG->OM2, LIMA->LAD);  b. 06/2018 Cath: Native 3VD, 2/3 patent grafts (VG->OM2 100)->Med rx; c. 05/2019 NSTEMI/Cath:  LM nl, LAD 99ost, 147m LCX 1057mRCA 100p, LIMA->LAD ok, VG->RPDA ok (? jump to OM), VG->OM not visualized (prev occluded). LVEDP 3018m-->Med Rx.   CKD (chronic kidney disease), stage III (HCC)    Gout    Gouty arthropathy, chronic, with tophi    Hernia, abdominal    HFrEF (heart failure with reduced ejection fraction) ([place]  a. 06/2018 Echo: EF 45-50%, mild MR; b. 05/2019 Echo: EF 30-35%, diff HK.   Hyperlipidemia LDL goal <70     Hypertension    Ischemic cardiomyopathy    a. 06/2018 Echo: EF 45-50%; b. 05/2019 Echo: EF 30-35%, diff HK.   MGUS (monoclonal gammopathy of unknown significance)    Myocardial infarction (HCC)    PAF (paroxysmal atrial fibrillation) [place]  a. Dx 05/2019-->CHA2DS2VASc = 5-->Eliquis.   Renal cyst     Past Surgical History:  Procedure Laterality Date   CARDIAC CATHETERIZATION     carpel tunnel     COLONOSCOPY WITH PROPOFOL N/A 01/14/2020   Procedure: COLONOSCOPY WITH PROPOFOL;  Surgeon: Toledo, TeoBenay PikeD;  Location: [facility] ENDOSCOPY;  Service: Gastroenterology;  Laterality: N/A;   CORONARY ARTERY BYPASS GRAFT     ESOPHAGOGASTRODUODENOSCOPY (EGD) WITH PROPOFOL N/A 01/14/2020   Procedure: ESOPHAGOGASTRODUODENOSCOPY (EGD) WITH PROPOFOL;  Surgeon: Toledo, TeoBenay PikeD;  Location: [facility] ENDOSCOPY;  Service: Gastroenterology;  Laterality: N/A;   ESOPHAGOGASTRODUODENOSCOPY (EGD) WITH PROPOFOL N/A 03/15/2021   Procedure: ESOPHAGOGASTRODUODENOSCOPY (EGD) WITH PROPOFOL;  Surgeon: Toledo, TeoBenay PikeD;  Location: [facility] ENDOSCOPY;  Service: Gastroenterology;  Laterality: N/A;   JOINT REPLACEMENT Bilateral 2002  2011   LEFT HEART CATH AND CORONARY ANGIOGRAPHY N/A 06/03/2018   Procedure: LEFT HEART CATH AND CORS/GRAFTS ANGIOGRAPHY;  Surgeon: ParIsaias CowmanD;  [facility];  Service: Cardiovascular;  Laterality: N/A;   LEFT HEART CATH AND CORS/GRAFTS ANGIOGRAPHY Bilateral 05/07/2019   Procedure: LEFT HEART CATH AND CORS/GRAFTS ANGIOGRAPHY;  Surgeon: GolMinna MerrittsD;  [facility];  Service: Cardiovascular;  Laterality: Bilateral;   REPLACEMENT TOTAL KNEE     VEIN BYPASS SURGERY       Home Medications:  Prior to Admission medications   Medication Sig Start Date End Date Taking? Authorizing Provider  amLODipine-benazepril (LOTREL) 5-20 MG capsule Take 1 capsule by mouth daily. 08/26/20 08/26/21  [provider]  atorvastatin (LIPITOR) 80 MG tablet  Take 80 mg by mouth daily.     [provider]  carvedilol (COREG) 25 MG tablet Take 1 tablet (25 mg total) by mouth 2 (two) times daily with a meal. 05/13/19   BerTheora GianottiP  Coenzyme Q10 (COQ10) 100 MG CAPS Take 100 mg by mouth daily.    [provider]  ELIQUIS 2.5 MG TABS tablet TAKE 1 TABLET(2.5 MG) BY MOUTH TWICE DAILY 04/14/21   AriWellington HampshireD  ezetimibe (ZETIA) 10 MG tablet TAKE 1 TABLET(10 MG) BY MOUTH DAILY 02/24/21   AriWellington HampshireD  famotidine (PEPCID) 20 MG tablet Take 20 mg by mouth 2 (two) times daily.    [provider]  furosemide (LASIX) 20 MG tablet TAKE 1 TABLET(20 MG) BY MOUTH DAILY 04/14/21   WalLoel DubonnetP  isosorbide mononitrate (IMDUR) 60 MG 24 hr tablet TAKE 1 TABLET(60 MG) BY MOUTH DAILY 07/11/21   AriWellington HampshireD  Multiple Vitamins-Minerals (MENS 50+ MULTI VITAMIN/MIN) TABS Take 1 tablet by  mouth daily.     [provider]  nitroGLYCERIN (NITROSTAT) 0.4 MG SL tablet Place 1 tablet (0.4 mg total) under the tongue every 5 (five) minutes as needed for chest pain (Nitro if taking third dose, call 911). 05/08/19   Marrianne Mood D, PA-C  Omega-3 1000 MG CAPS Take 1,000 mg by mouth daily.     [provider]    Inpatient Medications: Scheduled Meds:  heparin       etomidate  15 mg Intravenous Once   Continuous Infusions:  sodium chloride     amiodarone 60 mg/hr (08/07/21 1250)   Followed by   amiodarone     PRN Meds:   Allergies:    Allergies  Allergen Reactions   Penicillins Rash and Other (See Comments)    Has patient had a PCN reaction causing immediate rash, facial/tongue/throat swelling, SOB or lightheadedness with hypotension: No Has patient had a PCN reaction causing severe rash involving mucus membranes or skin necrosis: No Has patient had a PCN reaction that required hospitalization: No Has patient had a PCN reaction occurring within the last 10 years: No If all of the  above answers are "NO", then may proceed with Cephalosporin use.     Social History:   Social History   Socioeconomic History   Marital status: Single    Spouse name: Not on file   Number of children: Not on file   Years of education: Not on file   Highest education level: Not on file  Occupational History   Not on file  Tobacco Use   Smoking status: Never   Smokeless tobacco: Never  Vaping Use   Vaping Use: Never used  Substance and Sexual Activity   Alcohol use: No    Alcohol/week: 0.0 standard drinks   Drug use: No   Sexual activity: Yes  Other Topics Concern   Not on file  Social History Narrative   Not on file   Social Determinants of Health   Financial Resource Strain: Not on file  Food Insecurity: Not on file  Transportation Needs: Not on file  Physical Activity: Not on file  Stress: Not on file  Social Connections: Not on file  Intimate Partner Violence: Not on file    Family History:    Family History  Problem Relation Age of Onset   Asthma Mother    Diabetes Father    Hypertension Father      ROS:  Please see the history of present illness.  Review of Systems  Constitutional:  Negative for chills, fever and malaise/fatigue.  Respiratory:  Negative for cough and shortness of breath.   Cardiovascular:  Positive for chest pain and palpitations. Negative for orthopnea, leg swelling and PND.  Gastrointestinal:  Negative for blood in stool and melena.  Genitourinary:  Negative for hematuria.  Musculoskeletal:  Negative for falls.  Neurological:  Negative for loss of consciousness.  All other systems reviewed and are negative.  All other ROS reviewed and negative.     Physical Exam/Data:   Vitals:   08/07/21 1256 08/07/21 1258 08/07/21 1300 08/07/21 1301  BP: 112/70 114/64 106/64 96/76  Pulse:      Resp: 13 14 16 18   Temp:      TempSrc:      SpO2:       No intake or output data in the 24 hours ending 08/07/21 1314 Last 3 Weights 03/15/2021  03/01/2021 12/15/2020  Weight (lbs) 239 lb 243 lb 247 lb 9.2 oz  Weight (kg) 108.41 kg 110.224 kg 112.3 kg     There is no height or weight on file to calculate BMI.  General:  Well nourished, well developed, in no acute distress HEENT: normal Neck: no JVD Endocrine:  No thryomegaly Vascular: No carotid bruits; radial 2+ bilaterally   Cardiac:  normal S1, S2; IRIR; no murmur  Lungs:  clear to auscultation bilaterally, no wheezing, rhonchi or rales  Abd: soft, nontender, no hepatomegaly  Ext: no edema Musculoskeletal:  No deformities, BUE and BLE strength normal and equal Skin: warm and dry  Neuro:  CNs 2-12 intact, no focal abnormalities noted Psych:  Normal affect   EKG:  The EKG was personally reviewed and demonstrates: Atrial fibrillation with Gemini, ventricular rate 165 bpm. Subsequent EKG Ventricular tachycardia 170 bpm Telemetry:  Telemetry was personally reviewed and demonstrates: Atrial fibrillation with PVCs, ventricular tachycardia  Relevant CV Studies:   Carotid duplex 04/27/20 Summary:  Right Carotid: Velocities in the right ICA are consistent with a 60-79%                 stenosis. Non-hemodynamically significant plaque <50% noted  in    the CCA. The ECA appears >50% stenosed.   Left Carotid: Velocities in the left ICA are consistent with a 40-59%  stenosis.               Non-hemodynamically significant plaque <50% noted in the  CCA. The                ECA appears <50% stenosed.   Vertebrals:  Bilateral vertebral arteries demonstrate antegrade flow.  Subclavians: Right subclavian artery was stenotic. Normal flow  hemodynamics were               seen in the left subclavian artery.    Echo 12/2019  1. Left ventricular ejection fraction, by visual estimation, is 35 to  40%. The left ventricle has mild to moderately decreased function. There  is no left ventricular hypertrophy.   2. Left ventricular diastolic parameters are consistent with Grade I  diastolic  dysfunction (impaired relaxation).   3. Mildly dilated left ventricular internal cavity size.   4. The left ventricle demonstrates global hypokinesis.   5. Global right ventricle has normal systolic function.The right  ventricular size is normal. No increase in right ventricular wall  thickness.   6. Left atrial size was mild-moderately dilated.   7. TR signal is inadequate for assessing pulmonary artery systolic  pressure.   LHC 05/07/2019 Ost LAD lesion is 99% stenosed. Prox LAD lesion is 100% stenosed. Ost 2nd Mrg to 2nd Mrg lesion is 100% stenosed. Prox RCA lesion is 100% stenosed. SVG and is very large. The graft exhibits minimal luminal irregularities. The flow is not reversed. There is no competitive flow Origin lesion is 100% stenosed. Mid Cx lesion is 100% stenosed. Coronary Findings  Diagnostic Dominance: Right Left Anterior Descending  Vessel is small.  Ost LAD lesion is 99% stenosed.  Prox LAD lesion is 100% stenosed.  Left Circumflex  Mid Cx lesion is 100% stenosed.  Second Obtuse Marginal Branch  Ost 2nd Mrg to 2nd Mrg lesion is 100% stenosed.  Right Coronary Artery  Prox RCA lesion is 100% stenosed.  Sequential Saphenous Graft To Ost RPDA, Dist Cx  SVG and is very large. The graft exhibits minimal luminal irregularities. The flow is not reversed. There is no competitive flow  Graft To 2nd Mrg  Origin lesion is 100% stenosed.  LIMA Graft  To Mid LAD  Intervention  No interventions have been documented. Coronary Diagrams  Diagnostic Dominance: Right   Laboratory Data:  High Sensitivity Troponin:   Recent Labs  Lab 08/07/21 1122  TROPONINIHS 324*     Chemistry Recent Labs  Lab 08/07/21 1122  NA 140  K 4.6  CL 107  CO2 25  GLUCOSE 196*  BUN 50*  CREATININE 1.78*  CALCIUM 8.9  GFRNONAA 38*  ANIONGAP 8    Recent Labs  Lab 08/07/21 1122  PROT 6.7  ALBUMIN 3.7  AST 48*  ALT 48*  ALKPHOS 46  BILITOT 0.9   Hematology Recent Labs   Lab 08/07/21 1122  WBC 13.3*  RBC 3.84*  HGB 12.5*  HCT 37.9*  MCV 98.7  MCH 32.6  MCHC 33.0  RDW 13.7  PLT 212   BNPNo results for input(s): BNP, PROBNP in the last 168 hours.  DDimer No results for input(s): DDIMER in the last 168 hours.   Radiology/Studies:  DG Chest Portable 1 View  Result Date: 08/07/2021 CLINICAL DATA:  Chest pain EXAM: PORTABLE CHEST 1 VIEW COMPARISON:  Chest radiograph dated 05/06/2019. FINDINGS: The heart is enlarged. Median sternotomy wires are redemonstrated. Vascular calcifications are seen in the aortic arch. The left costophrenic angle is obscured which may reflect a trace pleural effusion with associated atelectasis/airspace disease. The right lung is clear with no pleural effusion. There is no pneumothorax. Degenerative changes are seen in the spine. IMPRESSION: Possible trace left pleural effusion with associated atelectasis/airspace disease. Aortic Atherosclerosis (ICD10-I70.0). Electronically Signed   By: Zerita Boers M.D.   On: 08/07/2021 11:49     Assessment and Plan:   NSTEMI History of CAD/CABG --Presented with chest pain. COVID19 positive. Cr at baseline (1.78) with known CKD. H&H stable.  Initial EKG significant for atrial fibrillation with RVR with subsequent EKG showing ventricular tachycardia, also noted on telemetry.  No acute ST/T changes.  High-sensitivity troponin 324, still cycling. Suspect Tn elevation due to coronary insufficiency given his exertional CP, history of CAD s/p CABG 1995 with last LHC as above with known dz, and recent VT suggestive of underlying ischemia.  Continue to cycle high-sensitivity troponin. Serial EKGs. Monitor telemetry. Obtain Echo as above to assess EF, WM, rule out acute structural changes. Daily BMET, CBC. Continue medical therapy Continue IV heparin, ASA Continue decreased dose of Coreg to 3.138m BID given softer pressure Imdur to start tomorrow, PRN SL nitro for CP Continue statin, Zetia At this  time, he will be admitted with tentative plan for catheterization this week on Monday or Tuesday and with consideration of his renal function, which is currently at his baseline.  HFrEF -- Denies shortness of breath or symptoms of overload.  Monitor volume status closely.  Previous EF 30 to 35%.  Pending updated echo.  PTA Lasix/newly started Losartan to continue today but to be held tomorrow in preparation for catheterization/contrast exposure/optimize Cr. Obtain echo Daily BMET Continue lasix 283mdaily, hold before cath Continue losartan 2575maily, hold before cath Continue Coreg Discontinued PTA amlodipine-benazepril  VT --Transient. Continue IV amiodarone.  Continue low-dose Coreg.  Monitor telemetry. Maintain K goal 4.0.  Check magnesium Dr. GolRockey Situ0.  Check TSH.  Atrial fibrillation with RVR --Continue low-dose Coreg, IV amiodarone.  IV heparin should be continued with Eliquis held.  Transaminitis -- Monitor closely.  Follow-up LFTs.  CKD --Daily BMET   For questions or updates, please contact [facility] consult [email or web address] for contact info under  Signed, Arvil Chaco, PA-C  08/07/2021 1:14 PM     .Attending Note:   The patient was seen and examined.  Agree with assessment and plan as noted above.  Changes made to the above note as needed.  Patient seen and independently examined with Marrianne Mood, PA . Marland Kitchen   We discussed all aspects of the encounter. I agree with the assessment and plan as stated above.    NSTEMI:   symptoms started suddenly - several minutes following his usual treadmill work out .  CP.  Rapid HR Presented to the [facility] with rapid AF , + Troponins .  He had an episode of ventricular tach - converted spontaneously to sinus.  We have started amiodarone. He is now pain free currently . HR is regular , sinus , PVCs Plan cath for tomorrow  We have discussed risks, benefits, options.  He understands and agrees to proceed.   2.    Atrial fib:   has Afib,  is on eliquis at home.  Hold eliquis,  getting heparin   3.   Acute on chronic CHF:   EF 35-40%. He is on coreg.   Will DC amlodipine - benazapril and start Losartan 50 -  We can consider entresto after the ACE-I has washed out .  4.  COVID +:  asymptomatic .  Further eval per [facility] hospitalist team .    I have spent a total of 40 minutes with patient reviewing hospital  notes , telemetry, EKGs, labs and examining patient as well as establishing an assessment and plan that was discussed with the patient.  > 50% of time was spent in direct patient care.    Thayer Headings, Brooke Bonito., MD, [facility] 08/07/2021, 2:22 PM [address],  [place] Pager [phone]

## 2021-08-07 NOTE — ED Notes (Signed)
ED Provider at bedside. 

## 2021-08-07 NOTE — ED Notes (Signed)
Dinner delivered to patient. Pt resting comfortably

## 2021-08-07 NOTE — ED Notes (Signed)
Pt states his cp started after walking on treadmill, eating his protein bar, and taking his Prednisone this am

## 2021-08-07 NOTE — ED Notes (Signed)
Pt in good spirits. Drip are checked. NADN

## 2021-08-07 NOTE — H&P (View-Only) (Signed)
Cardiology Consultation:   Patient ID: Jeff Henderson MRN: [id number]; DOB: Apr 28, 1941  Admit date: 08/07/2021 Date of Consult: 08/07/2021  PCP:  Jeff Henderson [facility]  Cardiologist:  Jeff [place], MD  Advanced Practice Provider:  No care team member to Henderson Electrophysiologist:  None   Patient Profile:   Jeff Henderson is a 80 y.o. male with a hx of coronary artery disease s/p CABG 5643, chronic systolic heart failure, paroxysmal atrial fibrillation on Eliquis, hypertension, subclavian stenosis, hyperlipidemia, carotid artery disease, MGUS, CKD 3, gout, BPH, and who is being seen today for the evaluation of NSTEMI and ventricular tachycardia COVID-19 positive at the request of Jeff Henderson.  History of Present Illness:   Jeff Henderson is an 80 year old male with PMH as above.  [facility] with [facility].  He was last seen in office 03/01/2021 by Jeff Henderson.  He does not drink alcohol or smoke tobacco.  Jeff illegal drugs.  Jeff family history of early cardiac death.  He has a history of CAD s/p CABG in 1995.  Subclavian artery stenosis identified during [facility] 2011.  He was admitted to [facility] 05/2018 for non-STEMI and subsequent LHC showed severe 3v CAD including CTO to the pRCA and m LAD, chronically occluded dLCx, grafted OM.  90%s ostial LAD, supplying 2 small diagonal branches.  EF 45 to 50% on echo.  Admitted 05/2019 with chest pain in the setting of new atrial fibrillation with RVR.  Self converted to NSR.  Repeat LHC without change in anatomy.  He was started on Eliquis.  Echo showed EF 30 to 35%.  When last seen in the office, he was doing well from a cardiac standpoint and reported exercise 3 times per week.  He presents today with chest pain and elevated troponin.  He was in his usual state of health and had just completed a workout on the treadmill.  Jeff CP during his workout. He had some water and a granola bar.  He  then took his prednisone.  Approximately 45 minutes later, he felt substernal / dull chest pain in the center of his chest with some radiation to the left shoulder.  CP 7/10 in severity and persisted approximately 30 minutes.  CP has waxed and waned since then, though improved in severity, and currently rated 3/10 in severity.  Associated symptoms included racing heart rate and palpitations.  Jeff shortness of breath, diaphoresis, presyncope. Jeff recent s/sx of volume overload.  He reports compliance with medications, including his Eliquis.  Jeff signs or symptoms of bleeding.    In the emergency department, he was noted to be COVID-19 positive.  He denies any symptoms associated with COVID-19.  Labs showed Cr 1.78 and at baseline with known history of CKD 3, K 4.6, elevated AST/ALT of 48, HS Tn 324 and still rounding.  Initial EKG showed atrial fibrillation with RVR.  He then briefly went into ventricular tachycardia but has since converted back to atrial fibrillation with RVR and frequent ectopy.  He has been started on IV amiodarone and received a one-time dose of diltiazem 20 mg. He has been started on IV   At the time of our consultation, he denies chest pain. He continues to deny COVID19 sx.  Past Medical History:  Diagnosis Date   Arthritis    BPH (benign prostatic hyperplasia)    CAD (coronary artery disease)    a. 1995 s/p CABG x 3 (VG->RPDA, VG->OM2, LIMA->LAD);  b. 06/2018 Cath: Native 3VD, 2/3 patent grafts (VG->OM2 100)->Med rx; c. 05/2019 NSTEMI/Cath:  LM nl, LAD 99ost, 168m LCX 1030mRCA 100p, LIMA->LAD ok, VG->RPDA ok (? jump to OM), VG->OM not visualized (prev occluded). LVEDP 3098m-->Med Rx.   CKD (chronic kidney disease), stage III (HCC)    Gout    Gouty arthropathy, chronic, with tophi    Hernia, abdominal    HFrEF (heart failure with reduced ejection fraction) ([facility]CC[facility]akely  a. 06/2018 Echo: EF 45-50%, mild MR; b. 05/2019 Echo: EF 30-35%, diff HK.   Hyperlipidemia LDL goal <70     Hypertension    Ischemic cardiomyopathy    a. 06/2018 Echo: EF 45-50%; b. 05/2019 Echo: EF 30-35%, diff HK.   MGUS (monoclonal gammopathy of unknown significance)    Myocardial infarction (HCC)    PAF (paroxysmal atrial fibrillation) ([facility]CCBeat[facility]  a. Dx 05/2019-->CHA2DS2VASc = 5-->Eliquis.   Renal cyst     Past Surgical History:  Procedure Laterality Date   CARDIAC CATHETERIZATION     carpel tunnel     COLONOSCOPY WITH PROPOFOL N/A 01/14/2020   Procedure: COLONOSCOPY WITH PROPOFOL;  Surgeon: Jeff Henderson;  Location: [facility] ENDOSCOPY;  Service: Gastroenterology;  Laterality: N/A;   CORONARY ARTERY BYPASS GRAFT     ESOPHAGOGASTRODUODENOSCOPY (EGD) WITH PROPOFOL N/A 01/14/2020   Procedure: ESOPHAGOGASTRODUODENOSCOPY (EGD) WITH PROPOFOL;  Surgeon: Jeff Henderson;  Location: [facility] ENDOSCOPY;  Service: Gastroenterology;  Laterality: N/A;   ESOPHAGOGASTRODUODENOSCOPY (EGD) WITH PROPOFOL N/A 03/15/2021   Procedure: ESOPHAGOGASTRODUODENOSCOPY (EGD) WITH PROPOFOL;  Surgeon: Jeff Henderson;  Location: [facility] ENDOSCOPY;  Service: Gastroenterology;  Laterality: N/A;   JOINT REPLACEMENT Bilateral 2002  2011   LEFT HEART CATH AND CORONARY ANGIOGRAPHY N/A 06/03/2018   Procedure: LEFT HEART CATH AND CORS/GRAFTS ANGIOGRAPHY;  Surgeon: Jeff Henderson;  [place] [facility];  Service: Cardiovascular;  Laterality: N/A;   LEFT HEART CATH AND CORS/GRAFTS ANGIOGRAPHY Bilateral 05/07/2019   Procedure: LEFT HEART CATH AND CORS/GRAFTS ANGIOGRAPHY;  Surgeon: Jeff Henderson;  [place] [facility];  Service: Cardiovascular;  Laterality: Bilateral;   REPLACEMENT TOTAL KNEE     VEIN BYPASS SURGERY       Home Medications:  Prior to Admission medications   Medication Sig Start Date End Date Taking? Authorizing Provider  amLODipine-benazepril (LOTREL) 5-20 MG capsule Take 1 capsule by mouth daily. 08/26/20 08/26/21  [provider]  atorvastatin (LIPITOR) 80 MG tablet  Take 80 mg by mouth daily.     [provider]  carvedilol (COREG) 25 MG tablet Take 1 tablet (25 mg total) by mouth 2 (two) times daily with a meal. 05/13/19   BerTheora GianottiP  Coenzyme Q10 (COQ10) 100 MG CAPS Take 100 mg by mouth daily.    [provider]  ELIQUIS 2.5 MG TABS tablet TAKE 1 TABLET(2.5 MG) BY MOUTH TWICE DAILY 04/14/21   AriWellington HampshireD  ezetimibe (ZETIA) 10 MG tablet TAKE 1 TABLET(10 MG) BY MOUTH DAILY 02/24/21   AriWellington HampshireD  famotidine (PEPCID) 20 MG tablet Take 20 mg by mouth 2 (two) times daily.    [provider]  furosemide (LASIX) 20 MG tablet TAKE 1 TABLET(20 MG) BY MOUTH DAILY 04/14/21   WalLoel DubonnetP  isosorbide mononitrate (IMDUR) 60 MG 24 hr tablet TAKE 1 TABLET(60 MG) BY MOUTH DAILY 07/11/21   AriWellington HampshireD  Multiple Vitamins-Minerals (MENS 50+ MULTI VITAMIN/MIN) TABS Take 1 tablet by  mouth daily.     [provider]  nitroGLYCERIN (NITROSTAT) 0.4 MG SL tablet Place 1 tablet (0.4 mg total) under the tongue every 5 (five) minutes as needed for chest pain (Nitro if taking third dose, call 911). 05/08/19   Marrianne Mood D, PA-C  Omega-3 1000 MG CAPS Take 1,000 mg by mouth daily.     [provider]    Inpatient Medications: Scheduled Meds:  heparin       etomidate  15 mg Intravenous Once   Continuous Infusions:  sodium chloride     amiodarone 60 mg/hr (08/07/21 1250)   Followed by   amiodarone     PRN Meds:   Allergies:    Allergies  Allergen Reactions   Penicillins Rash and Other (See Comments)    Has patient had a PCN reaction causing immediate rash, facial/tongue/throat swelling, SOB or lightheadedness with hypotension: Jeff Has patient had a PCN reaction causing severe rash involving mucus membranes or skin necrosis: Jeff Has patient had a PCN reaction that required hospitalization: Jeff Has patient had a PCN reaction occurring within the last 10 years: Jeff If all of the  above answers are "Jeff", then may proceed with Cephalosporin use.     Social History:   Social History   Socioeconomic History   Marital status: Single    Spouse name: Not on file   Number of children: Not on file   Years of education: Not on file   Highest education level: Not on file  Occupational History   Not on file  Tobacco Use   Smoking status: Never   Smokeless tobacco: Never  Vaping Use   Vaping Use: Never used  Substance and Sexual Activity   Alcohol use: Jeff    Alcohol/week: 0.0 standard drinks   Drug use: Jeff   Sexual activity: Yes  Other Topics Concern   Not on file  Social History Narrative   Not on file   Social Determinants of Health   Financial Resource Strain: Not on file  Food Insecurity: Not on file  Transportation Needs: Not on file  Physical Activity: Not on file  Stress: Not on file  Social Connections: Not on file  Intimate Partner Violence: Not on file    Family History:    Family History  Problem Relation Age of Onset   Asthma Mother    Diabetes Father    Hypertension Father      ROS:  Please see the history of present illness.  Review of Systems  Constitutional:  Negative for chills, fever and malaise/fatigue.  Respiratory:  Negative for cough and shortness of breath.   Cardiovascular:  Positive for chest pain and palpitations. Negative for orthopnea, leg swelling and PND.  Gastrointestinal:  Negative for blood in stool and melena.  Genitourinary:  Negative for hematuria.  Musculoskeletal:  Negative for falls.  Neurological:  Negative for loss of consciousness.  All other systems reviewed and are negative.  All other ROS reviewed and negative.     Physical Exam/Data:   Vitals:   08/07/21 1256 08/07/21 1258 08/07/21 1300 08/07/21 1301  BP: 112/70 114/64 106/64 96/76  Pulse:      Resp: 13 14 16 18   Temp:      TempSrc:      SpO2:       Jeff intake or output data in the 24 hours ending 08/07/21 1314 Last 3 Weights 03/15/2021  03/01/2021 12/15/2020  Weight (lbs) 239 lb 243 lb 247 lb 9.2 oz  Weight (kg) 108.41 kg 110.224 kg 112.3 kg     There is Jeff height or weight on file to calculate BMI.  General:  Well nourished, well developed, in Jeff acute distress HEENT: normal Neck: Jeff JVD Endocrine:  Jeff thryomegaly Vascular: Jeff carotid bruits; radial 2+ bilaterally   Cardiac:  normal S1, S2; IRIR; Jeff murmur  Lungs:  clear to auscultation bilaterally, Jeff wheezing, rhonchi or rales  Abd: soft, nontender, Jeff hepatomegaly  Ext: Jeff edema Musculoskeletal:  Jeff deformities, BUE and BLE strength normal and equal Skin: warm and dry  Neuro:  CNs 2-12 intact, Jeff focal abnormalities noted Psych:  Normal affect   EKG:  The EKG was personally reviewed and demonstrates: Atrial fibrillation with Gemini, ventricular rate 165 bpm. Subsequent EKG Ventricular tachycardia 170 bpm Telemetry:  Telemetry was personally reviewed and demonstrates: Atrial fibrillation with PVCs, ventricular tachycardia  Relevant CV Studies:   Carotid duplex 04/27/20 Summary:  Right Carotid: Velocities in the right ICA are consistent with a 60-79%                 stenosis. Non-hemodynamically significant plaque <50% noted  in    the CCA. The ECA appears >50% stenosed.   Left Carotid: Velocities in the left ICA are consistent with a 40-59%  stenosis.               Non-hemodynamically significant plaque <50% noted in the  CCA. The                ECA appears <50% stenosed.   Vertebrals:  Bilateral vertebral arteries demonstrate antegrade flow.  Subclavians: Right subclavian artery was stenotic. Normal flow  hemodynamics were               seen in the left subclavian artery.    Echo 12/2019  1. Left ventricular ejection fraction, by visual estimation, is 35 to  40%. The left ventricle has mild to moderately decreased function. There  is Jeff left ventricular hypertrophy.   2. Left ventricular diastolic parameters are consistent with Grade I  diastolic  dysfunction (impaired relaxation).   3. Mildly dilated left ventricular internal cavity size.   4. The left ventricle demonstrates global hypokinesis.   5. Global right ventricle has normal systolic function.The right  ventricular size is normal. Jeff increase in right ventricular wall  thickness.   6. Left atrial size was mild-moderately dilated.   7. TR signal is inadequate for assessing pulmonary artery systolic  pressure.   LHC 05/07/2019 Ost LAD lesion is 99% stenosed. Prox LAD lesion is 100% stenosed. Ost 2nd Mrg to 2nd Mrg lesion is 100% stenosed. Prox RCA lesion is 100% stenosed. SVG and is very large. The graft exhibits minimal luminal irregularities. The flow is not reversed. There is Jeff competitive flow Origin lesion is 100% stenosed. Mid Cx lesion is 100% stenosed. Coronary Findings  Diagnostic Dominance: Right Left Anterior Descending  Vessel is small.  Ost LAD lesion is 99% stenosed.  Prox LAD lesion is 100% stenosed.  Left Circumflex  Mid Cx lesion is 100% stenosed.  Second Obtuse Marginal Branch  Ost 2nd Mrg to 2nd Mrg lesion is 100% stenosed.  Right Coronary Artery  Prox RCA lesion is 100% stenosed.  Sequential Saphenous Graft To Ost RPDA, Dist Cx  SVG and is very large. The graft exhibits minimal luminal irregularities. The flow is not reversed. There is Jeff competitive flow  Graft To 2nd Mrg  Origin lesion is 100% stenosed.  LIMA Graft  To Mid LAD  Intervention  Jeff interventions have been documented. Coronary Diagrams  Diagnostic Dominance: Right   Laboratory Data:  High Sensitivity Troponin:   Recent Labs  Lab 08/07/21 1122  TROPONINIHS 324*     Chemistry Recent Labs  Lab 08/07/21 1122  NA 140  K 4.6  CL 107  CO2 25  GLUCOSE 196*  BUN 50*  CREATININE 1.78*  CALCIUM 8.9  GFRNONAA 38*  ANIONGAP 8    Recent Labs  Lab 08/07/21 1122  PROT 6.7  ALBUMIN 3.7  AST 48*  ALT 48*  ALKPHOS 46  BILITOT 0.9   Hematology Recent Labs   Lab 08/07/21 1122  WBC 13.3*  RBC 3.84*  HGB 12.5*  HCT 37.9*  MCV 98.7  MCH 32.6  MCHC 33.0  RDW 13.7  PLT 212   BNPNo results for input(s): BNP, PROBNP in the last 168 hours.  DDimer Jeff results for input(s): DDIMER in the last 168 hours.   Radiology/Studies:  DG Chest Portable 1 View  Result Date: 08/07/2021 CLINICAL DATA:  Chest pain EXAM: PORTABLE CHEST 1 VIEW COMPARISON:  Chest radiograph dated 05/06/2019. FINDINGS: The heart is enlarged. Median sternotomy wires are redemonstrated. Vascular calcifications are seen in the aortic arch. The left costophrenic angle is obscured which may reflect a trace pleural effusion with associated atelectasis/airspace disease. The right lung is clear with Jeff pleural effusion. There is Jeff pneumothorax. Degenerative changes are seen in the spine. IMPRESSION: Possible trace left pleural effusion with associated atelectasis/airspace disease. Aortic Atherosclerosis (ICD10-I70.0). Electronically Signed   By: Zerita Boers M.D.   On: 08/07/2021 11:49     Assessment and Plan:   NSTEMI History of CAD/CABG --Presented with chest pain. COVID19 positive. Cr at baseline (1.78) with known CKD. H&H stable.  Initial EKG significant for atrial fibrillation with RVR with subsequent EKG showing ventricular tachycardia, also noted on telemetry.  Jeff acute ST/T changes.  High-sensitivity troponin 324, still cycling. Suspect Tn elevation due to coronary insufficiency given his exertional CP, history of CAD s/p CABG 1995 with last LHC as above with known dz, and recent VT suggestive of underlying ischemia.  Continue to cycle high-sensitivity troponin. Serial EKGs. Monitor telemetry. Obtain Echo as above to assess EF, WM, rule out acute structural changes. Daily BMET, CBC. Continue medical therapy Continue IV heparin, ASA Continue decreased dose of Coreg to 3.17m BID given softer pressure Imdur to start tomorrow, PRN SL nitro for CP Continue statin, Zetia At this  time, he will be admitted with tentative plan for catheterization this week on Monday or Tuesday and with consideration of his renal function, which is currently at his baseline.  HFrEF -- Denies shortness of breath or symptoms of overload.  Monitor volume status closely.  Previous EF 30 to 35%.  Pending updated echo.  PTA Lasix/newly started Losartan to continue today but to be held tomorrow in preparation for catheterization/contrast exposure/optimize Cr. Obtain echo Daily BMET Continue lasix 280mdaily, hold before cath Continue losartan 2547maily, hold before cath Continue Coreg Discontinued PTA amlodipine-benazepril  VT --Transient. Continue IV amiodarone.  Continue low-dose Coreg.  Monitor telemetry. Maintain K goal 4.0.  Check magnesium Dr. GolRockey Situ0.  Check TSH.  Atrial fibrillation with RVR --Continue low-dose Coreg, IV amiodarone.  IV heparin should be continued with Eliquis held.  Transaminitis -- Monitor closely.  Follow-up LFTs.  CKD --Daily BMET   For questions or updates, please contact [facility] consult [email or web address] for contact info under  Signed, Arvil Chaco, PA-C  08/07/2021 1:14 PM     .Attending Note:   The patient was seen and examined.  Agree with assessment and plan as noted above.  Changes made to the above note as needed.  Patient seen and independently examined with Marrianne Mood, PA . Marland Kitchen   We discussed all aspects of the encounter. I agree with the assessment and plan as stated above.    NSTEMI:   symptoms started suddenly - several minutes following his usual treadmill work out .  CP.  Rapid HR Presented to the [facility] with rapid AF , + Troponins .  He had an episode of ventricular tach - converted spontaneously to sinus.  We have started amiodarone. He is now pain free currently . HR is regular , sinus , PVCs Plan cath for tomorrow  We have discussed risks, benefits, options.  He understands and agrees to proceed.   2.    Atrial fib:   has Afib,  is on eliquis at home.  Hold eliquis,  getting heparin   3.   Acute on chronic CHF:   EF 35-40%. He is on coreg.   Will DC amlodipine - benazapril and start Losartan 50 -  We can consider entresto after the ACE-I has washed out .  4.  COVID +:  asymptomatic .  Further eval per [facility] hospitalist team .    I have spent a total of 40 minutes with patient reviewing hospital  notes , telemetry, EKGs, labs and examining patient as well as establishing an assessment and plan that was discussed with the patient.  > 50% of time was spent in direct patient care.    Thayer Headings, Brooke Bonito., MD, [facility] 08/07/2021, 2:22 PM [id number]  [place] Pager [phone]

## 2021-08-07 NOTE — ED Triage Notes (Signed)
Pt in from home via [facility], drove self to fire dept for dull L sided cp that started 1.5 hrs ago. Denies any sob or n/v. En route, some ST elevation, but no STEMI called per EDP. EKG transmitted reads Afib rate 130-180's. BP 122/83. 267m's NS given en route. Cp currently 7/10. 3267mASA given en route.

## 2021-08-07 NOTE — Consult Note (Signed)
ANTICOAGULATION CONSULT NOTE - Initial Consult  Pharmacy Consult for Heparin drip Indication: chest pain/ACS  Allergies  Allergen Reactions   Penicillins Rash and Other (See Comments)    Has patient had a PCN reaction causing immediate rash, facial/tongue/throat swelling, SOB or lightheadedness with hypotension: No Has patient had a PCN reaction causing severe rash involving mucus membranes or skin necrosis: No Has patient had a PCN reaction that required hospitalization: No Has patient had a PCN reaction occurring within the last 10 years: No If all of the above answers are "NO", then may proceed with Cephalosporin use.     Patient Measurements: Weight: 104.3 kg (230 lb) Heparin Dosing Weight: 93kg  Vital Signs: Temp: 97.6 F (36.4 C) (09/05 1120) Temp Source: Oral (09/05 1120) BP: 118/74 (09/05 1330) Pulse Rate: 70 (09/05 1330)  Labs: Recent Labs    08/07/21 1122  HGB 12.5*  HCT 37.9*  PLT 212  APTT 29  LABPROT 14.1  INR 1.1  CREATININE 1.78*  TROPONINIHS 324*    Estimated Creatinine Clearance: 38.7 mL/min (A) (by C-G formula based on SCr of 1.78 mg/dL (H)).   Medical History: Past Medical History:  Diagnosis Date   Arthritis    BPH (benign prostatic hyperplasia)    CAD (coronary artery disease)    a. 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 06/2018 Cath: Native 3VD, 2/3 patent grafts (VG->OM2 100)->Med rx; c. 05/2019 NSTEMI/Cath:  LM nl, LAD 99ost, 162m LCX 109mRCA 100p, LIMA->LAD ok, VG->RPDA ok (? jump to OM), VG->OM not visualized (prev occluded). LVEDP 3033m-->Med Rx.   CKD (chronic kidney disease), stage III (HCC)    Gout    Gouty arthropathy, chronic, with tophi    Hernia, abdominal    HFrEF (heart failure with reduced ejection fraction) ([facility]CC[facility]ennings  a. 06/2018 Echo: EF 45-50%, mild MR; b. 05/2019 Echo: EF 30-35%, diff HK.   Hyperlipidemia LDL goal <70    Hypertension    Ischemic cardiomyopathy    a. 06/2018 Echo: EF 45-50%; b. 05/2019 Echo:  EF 30-35%, diff HK.   MGUS (monoclonal gammopathy of unknown significance)    Myocardial infarction (HCC)    PAF (paroxysmal atrial fibrillation) ([facility]CC[facility]  a. Dx 05/2019-->CHA2DS2VASc = 5-->Eliquis.   Renal cyst     Medications:  (Not in a hospital admission)   Assessment: Patient with PMH relevant CAD s/p CABG, HF, Afib (on eliquis), CKD3,subclavian stenosis and hyperlipidemia. Patient presents to ED with chest pain. Pharmacy consulted to manage heparin gtt for ACS.  Goal of Therapy:  Heparin level 0.3-0.7 units/ml aPTT 66-102 seconds Monitor platelets by anticoagulation protocol: Yes   Plan:  Bolus 4000 units bolus x 1 - given per MD order prior to consult Start heparin infusion at 1250 units/hr Check anti-Xa level  and heparin level in 8 hours and daily while on heparin Continue to monitor H&H and platelets  Zlata Alcaide Rodriguez-Guzman PharmD, BCPS 08/07/2021 2:24 PM

## 2021-08-07 NOTE — ED Provider Notes (Signed)
[facility]  ____________________________________________   Event Date/Time   First MD Initiated Contact with Patient 08/07/21 1118     (approximate)  I have reviewed the triage vital signs and the nursing notes.   HISTORY  Chief Complaint Chest Pain and Atrial Fibrillation    HPI Jeff Henderson is a 80 y.o. 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 fib on Eliquis BPH who presents with chest pain.  Symptom started about 1 hour prior to arrival.  He describes a heavy feeling in the center of his chest without associated nausea vomiting or diaphoresis.  He denies shortness of breath.  He does feel palpitations but not currently.  Denies abdominal pain fevers or chills.  Patient is compliant with his Eliquis and all of his other medications.         Past Medical History:  Diagnosis Date  . Arthritis   . BPH (benign prostatic hyperplasia)   . CAD (coronary artery disease)    a. 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 06/2018 Cath: Native 3VD, 2/3 patent grafts (VG->OM2 100)->Med rx; c. 05/2019 NSTEMI/Cath:  LM nl, LAD 99ost, 178m LCX 1035mRCA 100p, LIMA->LAD ok, VG->RPDA ok (? jump to OM), VG->OM not visualized (prev occluded). LVEDP 3032m-->Med Rx.  . CKD (chronic kidney disease), stage III ([facility]CC[place] . Gout   . Gouty arthropathy, chronic, with tophi   . Hernia, abdominal   . HFrEF (heart failure with reduced ejection fraction) ([place]  a. 06/2018 Echo: EF 45-50%, mild MR; b. 05/2019 Echo: EF 30-35%, diff HK.  . HMarland Kitchenperlipidemia LDL goal <70   . Hypertension   . Ischemic cardiomyopathy    a. 06/2018 Echo: EF 45-50%; b. 05/2019 Echo: EF 30-35%, diff HK.  . MMarland KitchenUS (monoclonal gammopathy of unknown significance)   . Myocardial infarction (HCC[place] . PAF (paroxysmal atrial fibrillation) (HCC[place]ell[place]  a. Dx 05/2019-->CHA2DS2VASc = 5-->Eliquis.  . Renal cyst      Patient Active Problem List   Diagnosis Date Noted  . Anemia in chronic kidney disease 12/14/2020  . Elevated serum immunoglobulin free light chains 06/02/2019  . Hyperlipidemia LDL goal <70   . CAD (coronary artery disease)   . A-fib (HCC[place]6/02/2019  . NSTEMI (non-ST elevated myocardial infarction) (HCC[place]6/30/2019  . CKD (chronic kidney disease), stage III (HCCCro[place]6/29/2019  . Chest pain 05/31/2018  . Dupuytren's contracture of left hand 05/19/2018  . Hyperlipidemia, mixed 10/11/2017  . Umbilical hernia without obstruction and without gangrene 12/20/2015  . DDD (degenerative disc disease), cervical 08/01/2015  . 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 Arteriosclerosis of coronary artery 12/17/2014  . HTN (hypertension) 12/17/2014  . Impaired renal function 12/17/2014  . Kidney cysts 10/23/2012  . 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 Polypharmacy 01/30/2012  . Arthritis, degenerative 01/30/2012    Past Surgical History:  Procedure Laterality Date  . CARDIAC CATHETERIZATION    . carpel tunnel    . COLONOSCOPY WITH PROPOFOL N/A 01/14/2020   Procedure: COLONOSCOPY WITH PROPOFOL;  Surgeon: Toledo, TeoBenay PikeD;  Location: [facility] ENDOSCOPY;  Service: Gastroenterology;  Laterality: N/A;  . CORONARY ARTERY BYPASS GRAFT    . ESOPHAGOGASTRODUODENOSCOPY (EGD) WITH PROPOFOL N/A 01/14/2020   Procedure: ESOPHAGOGASTRODUODENOSCOPY (EGD) WITH PROPOFOL;  Surgeon: Toledo, TeoBenay PikeD;  Location: [facility] ENDOSCOPY;  Service: Gastroenterology;  Laterality: N/A;  . ESOPHAGOGASTRODUODENOSCOPY (EGD) WITH PROPOFOL N/A 03/15/2021  Procedure: ESOPHAGOGASTRODUODENOSCOPY (EGD) WITH PROPOFOL;  Surgeon: Toledo, Benay Pike, MD;  Location: [facility] ENDOSCOPY;  Service: Gastroenterology;  Laterality: N/A;  . JOINT REPLACEMENT Bilateral 2002  2011  . LEFT HEART CATH AND CORONARY ANGIOGRAPHY N/A 06/03/2018   Procedure: LEFT HEART CATH AND CORS/GRAFTS ANGIOGRAPHY;  Surgeon: Isaias Cowman, MD;  Location: [facility];  Service: Cardiovascular;  Laterality: N/A;  . LEFT HEART CATH AND CORS/GRAFTS ANGIOGRAPHY Bilateral 05/07/2019   Procedure: LEFT HEART CATH AND CORS/GRAFTS ANGIOGRAPHY;  Surgeon: Minna Merritts, MD;  Location: [facility];  Service: Cardiovascular;  Laterality: Bilateral;  . REPLACEMENT TOTAL KNEE    . VEIN BYPASS SURGERY      Prior to Admission medications   Medication Sig Start Date End Date Taking? Authorizing Provider  amLODipine-benazepril (LOTREL) 5-20 MG capsule Take 1 capsule by mouth daily. 08/26/20 08/26/21  [provider]  atorvastatin (LIPITOR) 80 MG tablet Take 80 mg by mouth daily.     [provider]  carvedilol (COREG) 25 MG tablet Take 1 tablet (25 mg total) by mouth 2 (two) times daily with a meal. 05/13/19   Theora Gianotti, NP  Coenzyme Q10 (COQ10) 100 MG CAPS Take 100 mg by mouth daily.    [provider]  ELIQUIS 2.5 MG TABS tablet TAKE 1 TABLET(2.5 MG) BY MOUTH TWICE DAILY 04/14/21   Wellington Hampshire, MD  ezetimibe (ZETIA) 10 MG tablet TAKE 1 TABLET(10 MG) BY MOUTH DAILY 02/24/21   Wellington Hampshire, MD  famotidine (PEPCID) 20 MG tablet Take 20 mg by mouth 2 (two) times daily.    [provider]  furosemide (LASIX) 20 MG tablet TAKE 1 TABLET(20 MG) BY MOUTH DAILY 04/14/21   Loel Dubonnet, NP  isosorbide mononitrate (IMDUR) 60 MG 24 hr tablet TAKE 1 TABLET(60 MG) BY MOUTH DAILY 07/11/21   Wellington Hampshire, MD  Multiple Vitamins-Minerals (MENS 50+ MULTI VITAMIN/MIN) TABS Take 1 tablet by mouth daily.     [provider]  nitroGLYCERIN (NITROSTAT) 0.4 MG SL tablet Place 1 tablet (0.4 mg total) under the tongue every 5 (five) minutes as needed for chest pain (Nitro if taking third dose, call 911). 05/08/19   Marrianne Mood D, PA-C  Omega-3 1000 MG CAPS Take 1,000 mg by mouth daily.     [provider]    Allergies Penicillins  Family History  Problem Relation Age of Onset  . Asthma Mother    . Diabetes Father   . Hypertension Father     Social History Social History   Tobacco Use  . Smoking status: Never  . Smokeless tobacco: Never  Vaping Use  . Vaping Use: Never used  Substance Use Topics  . Alcohol use: No    Alcohol/week: 0.0 standard drinks  . Drug use: No    Review of Systems   Review of Systems  Constitutional:  Negative for chills and fever.  Respiratory:  Positive for chest tightness. Negative for shortness of breath.   Cardiovascular:  Positive for chest pain and palpitations. Negative for leg swelling.  Gastrointestinal:  Negative for abdominal pain, nausea and vomiting.  All other systems reviewed and are negative.  Physical Exam Updated Vital Signs BP 118/74   Pulse 70   Temp 97.6 F (36.4 C) (Oral)   Resp (!) 23   SpO2 100%   Physical Exam Vitals and nursing note reviewed.  Constitutional:      General: He is not in acute distress.  Appearance: Normal appearance.  HENT:     Head: Normocephalic and atraumatic.  Eyes:     General: No scleral icterus.    Conjunctiva/sclera: Conjunctivae normal.  Cardiovascular:     Rate and Rhythm: Tachycardia present. Rhythm irregular.  Pulmonary:     Effort: Pulmonary effort is normal. No respiratory distress.     Breath sounds: Normal breath sounds. No wheezing.  Musculoskeletal:        General: No deformity or signs of injury.     Cervical back: Normal range of motion.     Right lower leg: No edema.     Left lower leg: No edema.  Skin:    Coloration: Skin is not jaundiced or pale.  Neurological:     General: No focal deficit present.     Mental Status: He is alert and oriented to person, place, and time. Mental status is at baseline.  Psychiatric:        Mood and Affect: Mood normal.        Behavior: Behavior normal.     LABS (all labs ordered are listed, but only abnormal results are displayed)  Labs Reviewed  RESP PANEL BY RT-PCR (FLU A&B, COVID) ARPGX2 - Abnormal; Notable for the  following components:      Result Value   SARS Coronavirus 2 by RT PCR POSITIVE (*)    All other components within normal limits  COMPREHENSIVE METABOLIC PANEL - Abnormal; Notable for the following components:   Glucose, Bld 196 (*)    BUN 50 (*)    Creatinine, Ser 1.78 (*)    AST 48 (*)    ALT 48 (*)    GFR, Estimated 38 (*)    All other components within normal limits  CBC WITH DIFFERENTIAL/PLATELET - Abnormal; Notable for the following components:   WBC 13.3 (*)    RBC 3.84 (*)    Hemoglobin 12.5 (*)    HCT 37.9 (*)    Neutro Abs 11.6 (*)    Abs Immature Granulocytes 0.16 (*)    All other components within normal limits  TROPONIN I (HIGH SENSITIVITY) - Abnormal; Notable for the following components:   Troponin I (High Sensitivity) 324 (*)    All other components within normal limits  PROTIME-INR  APTT  MAGNESIUM  TROPONIN I (HIGH SENSITIVITY)   ____________________________________________  EKG  First EKG : atrial fibrillation with rapid ventricular response, ST depressions in V3 through V6  Second EKG: Wide-complex tachycardia ____________________________________________  RADIOLOGY Almeta Monas, personally viewed and evaluated these images (plain radiographs) as part of my medical decision making, as well as reviewing the written report by the radiologist.  ED MD interpretation: I reviewed the chest x-ray which shows cardiomegaly, pulmonary vascular congestion    ____________________________________________   PROCEDURES  Procedure(s) performed (including Critical Care):  .Critical Care  Date/Time: 08/07/2021 1:54 PM Performed by: Rada Hay, MD Authorized by: Rada Hay, MD   Critical care provider statement:    Critical care time (minutes):  120   Critical care was necessary to treat or prevent imminent or life-threatening deterioration of the following conditions:  Cardiac failure   Critical care was time spent personally by me on  the following activities:  Development of treatment plan with patient or surrogate, discussions with consultants, evaluation of patient's response to treatment, examination of patient, ordering and performing treatments and interventions, ordering and review of laboratory studies, ordering and review of radiographic studies, pulse oximetry, re-evaluation of patient's condition and  review of old charts   I assumed direction of critical care for this patient from another provider in my specialty: no     Care discussed with: admitting provider     ____________________________________________   INITIAL IMPRESSION / ASSESSMENT AND PLAN / ED COURSE     80 year old male with significant cardiac history who presented to EMS with chest pain.  He was found to be in atrial fibrillation with RVR.  On arrival his EKG shows A. fib with RVR, with a blood pressure in the 120s over 80s.  He overall is well-appearing, chest pain pressure-like in the center of his chest rated about a 5 out of 10.  Patient was given initial dose of 20 mg of IV diltiazem slow push and dropped his blood pressures to 80s over 50s.  This self resolved.  Patient's troponin resulted at 320.  Patient's heart rate briefly went up to the 170s and the tech noticed a change in the rhythm on the monitor and EKG Which to Me Looks like Ventricular Tachycardia.  I spoke with Dr. Katharina Caper with cardiology and discussed the constellation of symptoms as well as the ventricular tachycardia and elevated troponin and he feels that he likely needs to go to the Cath Lab.  We will start amiodarone at this time patient given heparin bolus and aspirin.  His systolic blood pressure is 100 with a MAP of 78 so we do not really have room for nitroglycerin at this time.  Spoke with Dr. Audelia Acton with interventional cardiology who unfortunately is taking another STEMI patient to the Cath Lab currently but will come down to evaluate this patient after that procedure is  done.  Patient converted to sinus rhythm on amnio drip.  Pain significantly improved.  Patient was evaluated by cardiologist on-call in person who recommends continuing the amiodarone as well as heparin drip.  Does not feel like he needs to the Cath Lab emergently at this time but would likely benefit during his admission.  Patient is notably COVID-positive but has minimal symptoms.  Will admit to the medicine service with cardiology consultation. Clinical Course as of 08/07/21 1418  Mon Aug 07, 2021  1213 Troponin I (High Sensitivity)(!!): 324 [KM]  1224 Troponin I (High Sensitivity)(!!): 324 [KM]  1224 Pt received 324 ASA with EMS [KM]  1505 SARS Coronavirus 2 by RT PCR(!): POSITIVE [KM]  1418 Troponin I (High Sensitivity)(!!): 1,725 [KM]    Clinical Course User Index [KM] Rada Hay, MD     ____________________________________________   FINAL CLINICAL IMPRESSION(S) / ED DIAGNOSES  Final diagnoses:  NSTEMI (non-ST elevated myocardial infarction) [facility])  Atrial fibrillation with RVR [facility])     ED Discharge Orders     None        Note:  This document was prepared using Dragon voice recognition software and may include unintentional dictation errors.    Rada Hay, MD 08/07/21 [phone]

## 2021-08-08 ENCOUNTER — Inpatient Hospital Stay (HOSPITAL_COMMUNITY)
Admit: 2021-08-08 | Discharge: 2021-08-08 | Disposition: A | Discharge status: Home / Self Care | Payer: Medicare Other | Attending: Physician Assistant | Admitting: Physician Assistant

## 2021-08-08 ENCOUNTER — Encounter
Admission: EM | Disposition: A | Discharge status: Home / Self Care | Payer: Self-pay | Source: Home / Self Care | Attending: Internal Medicine

## 2021-08-08 ENCOUNTER — Other Ambulatory Visit: Payer: Self-pay

## 2021-08-08 ENCOUNTER — Encounter: Payer: Self-pay | Admitting: Family Medicine

## 2021-08-08 DIAGNOSIS — I25810 Atherosclerosis of coronary artery bypass graft(s) without angina pectoris: Secondary | ICD-10-CM

## 2021-08-08 DIAGNOSIS — N189 Chronic kidney disease, unspecified: Secondary | ICD-10-CM

## 2021-08-08 DIAGNOSIS — R079 Chest pain, unspecified: Secondary | ICD-10-CM

## 2021-08-08 DIAGNOSIS — I2510 Atherosclerotic heart disease of native coronary artery without angina pectoris: Secondary | ICD-10-CM

## 2021-08-08 HISTORY — PX: LEFT HEART CATH AND CORS/GRAFTS ANGIOGRAPHY: CATH118250

## 2021-08-08 LAB — BASIC METABOLIC PANEL
Anion gap: 12 (ref 5–15)
BUN: 50 mg/dL — ABNORMAL HIGH (ref 8–23)
CO2: 25 mmol/L (ref 22–32)
Calcium: 8.7 mg/dL — ABNORMAL LOW (ref 8.9–10.3)
Chloride: 104 mmol/L (ref 98–111)
Creatinine, Ser: 1.93 mg/dL — ABNORMAL HIGH (ref 0.61–1.24)
GFR, Estimated: 34 mL/min — ABNORMAL LOW (ref >60)
Glucose, Bld: 113 mg/dL — ABNORMAL HIGH (ref 70–99)
Potassium: 4.6 mmol/L (ref 3.5–5.1)
Sodium: 141 mmol/L (ref 135–145)

## 2021-08-08 LAB — ECHOCARDIOGRAM COMPLETE
AR max vel: 3.1 cm2
AV Area VTI: 2.88 cm2
AV Area mean vel: 3.33 cm2
AV Mean grad: 2 mmHg
AV Peak grad: 3.7 mmHg
Ao pk vel: 0.96 m/s
Area-P 1/2: 2.44 cm2
MV VTI: 2.31 cm2
S' Lateral: 6.8 cm
Weight: 3680 oz

## 2021-08-08 LAB — CBC
HCT: 42.2 % (ref 39.0–52.0)
Hemoglobin: 13.5 g/dL (ref 13.0–17.0)
MCH: 31.5 pg (ref 26.0–34.0)
MCHC: 32 g/dL (ref 30.0–36.0)
MCV: 98.6 fL (ref 80.0–100.0)
Platelets: 196 10*3/uL (ref 150–400)
RBC: 4.28 MIL/uL (ref 4.22–5.81)
RDW: 14 % (ref 11.5–15.5)
WBC: 14.1 10*3/uL — ABNORMAL HIGH (ref 4.0–10.5)
nRBC: 0 % (ref 0.0–0.2)

## 2021-08-08 LAB — TROPONIN I (HIGH SENSITIVITY)
Troponin I (High Sensitivity): >24000 ng/L (ref <18)
Troponin I (High Sensitivity): >24000 ng/L (ref <18)

## 2021-08-08 LAB — APTT
aPTT: >160 seconds (ref 24–36)
aPTT: >160 seconds (ref 24–36)

## 2021-08-08 LAB — POCT ACTIVATED CLOTTING TIME: Activated Clotting Time: 144 seconds

## 2021-08-08 LAB — HEPARIN LEVEL (UNFRACTIONATED)
Heparin Unfractionated: >1.1 IU/mL — ABNORMAL HIGH (ref 0.30–0.70)
Heparin Unfractionated: >1.1 IU/mL — ABNORMAL HIGH (ref 0.30–0.70)

## 2021-08-08 SURGERY — LEFT HEART CATH AND CORS/GRAFTS ANGIOGRAPHY
Anesthesia: Moderate Sedation

## 2021-08-08 MED ORDER — SODIUM CHLORIDE 0.9% FLUSH: 3.0000 mL | INTRAVENOUS | Status: DC | PRN

## 2021-08-08 MED ORDER — HEPARIN SODIUM (PORCINE) 1000 UNIT/ML IJ SOLN
INTRAMUSCULAR | Status: AC
  Filled 2021-08-08: qty 1

## 2021-08-08 MED ORDER — HYDRALAZINE HCL 20 MG/ML IJ SOLN
INTRAMUSCULAR | Status: AC
  Filled 2021-08-08: qty 1

## 2021-08-08 MED ORDER — HEPARIN (PORCINE) IN NACL 1000-0.9 UT/500ML-% IV SOLN
INTRAVENOUS | Status: DC | PRN
  Administered 2021-08-08: 500 mL

## 2021-08-08 MED ORDER — SODIUM CHLORIDE 0.9 % IV SOLN
200.0000 mg | Freq: Once | INTRAVENOUS | Status: AC
  Administered 2021-08-08: 200 mg via INTRAVENOUS
  Filled 2021-08-08 (×2): qty 40

## 2021-08-08 MED ORDER — PERFLUTREN LIPID MICROSPHERE
1.0000 mL | INTRAVENOUS | Status: AC | PRN
  Administered 2021-08-08: 4 mL via INTRAVENOUS
  Filled 2021-08-08: qty 10

## 2021-08-08 MED ORDER — ACETAMINOPHEN 325 MG PO TABS
650.0000 mg | ORAL_TABLET | ORAL | Status: DC | PRN
  Administered 2021-08-08 – 2021-08-09 (×2): 650 mg via ORAL
  Filled 2021-08-08 (×2): qty 2

## 2021-08-08 MED ORDER — FUROSEMIDE 10 MG/ML IJ SOLN
80.0000 mg | Freq: Two times a day (BID) | INTRAMUSCULAR | Status: DC
  Administered 2021-08-09 – 2021-08-10 (×3): 80 mg via INTRAVENOUS
  Filled 2021-08-08 (×3): qty 8

## 2021-08-08 MED ORDER — INFLUENZA VAC A&B SA ADJ QUAD 0.5 ML IM PRSY
0.5000 mL | PREFILLED_SYRINGE | INTRAMUSCULAR | Status: DC
  Filled 2021-08-08: qty 0.5

## 2021-08-08 MED ORDER — FENTANYL CITRATE (PF) 100 MCG/2ML IJ SOLN
INTRAMUSCULAR | Status: DC | PRN
  Administered 2021-08-08 (×2): 12.5 ug via INTRAVENOUS

## 2021-08-08 MED ORDER — HEPARIN (PORCINE) IN NACL 1000-0.9 UT/500ML-% IV SOLN
INTRAVENOUS | Status: AC
  Filled 2021-08-08: qty 1000

## 2021-08-08 MED ORDER — MIDAZOLAM HCL 2 MG/2ML IJ SOLN
INTRAMUSCULAR | Status: AC
  Filled 2021-08-08: qty 2

## 2021-08-08 MED ORDER — FENTANYL CITRATE PF 50 MCG/ML IJ SOSY
PREFILLED_SYRINGE | INTRAMUSCULAR | Status: AC
  Filled 2021-08-08: qty 1

## 2021-08-08 MED ORDER — SODIUM CHLORIDE 0.9 % IV SOLN: 250.0000 mL | INTRAVENOUS | Status: DC | PRN

## 2021-08-08 MED ORDER — LABETALOL HCL 5 MG/ML IV SOLN: 10.0000 mg | INTRAVENOUS | Status: AC | PRN

## 2021-08-08 MED ORDER — HEPARIN (PORCINE) 25000 UT/250ML-% IV SOLN: 700.0000 [IU]/h | INTRAVENOUS | Status: DC

## 2021-08-08 MED ORDER — SODIUM CHLORIDE 0.9 % IV SOLN
100.0000 mg | Freq: Every day | INTRAVENOUS | Status: AC
  Administered 2021-08-09 – 2021-08-10 (×2): 100 mg via INTRAVENOUS
  Filled 2021-08-08: qty 20
  Filled 2021-08-08 (×2): qty 100

## 2021-08-08 MED ORDER — AMIODARONE HCL 200 MG PO TABS
400.0000 mg | ORAL_TABLET | Freq: Two times a day (BID) | ORAL | Status: DC
  Administered 2021-08-08 – 2021-08-10 (×4): 400 mg via ORAL
  Filled 2021-08-08 (×5): qty 2

## 2021-08-08 MED ORDER — VERAPAMIL HCL 2.5 MG/ML IV SOLN
INTRAVENOUS | Status: AC
  Filled 2021-08-08: qty 2

## 2021-08-08 MED ORDER — HEPARIN (PORCINE) 25000 UT/250ML-% IV SOLN
700.0000 [IU]/h | INTRAVENOUS | Status: DC
  Administered 2021-08-09 – 2021-08-10 (×2): 700 [IU]/h via INTRAVENOUS
  Filled 2021-08-08 (×2): qty 250

## 2021-08-08 MED ORDER — ONDANSETRON HCL 4 MG/2ML IJ SOLN: 4.0000 mg | Freq: Four times a day (QID) | INTRAMUSCULAR | Status: DC | PRN

## 2021-08-08 MED ORDER — ATORVASTATIN CALCIUM 80 MG PO TABS
80.0000 mg | ORAL_TABLET | Freq: Every day | ORAL | Status: DC
  Administered 2021-08-08 – 2021-08-09 (×3): 80 mg via ORAL
  Filled 2021-08-08 (×4): qty 1

## 2021-08-08 MED ORDER — SODIUM CHLORIDE 0.9% FLUSH
3.0000 mL | Freq: Two times a day (BID) | INTRAVENOUS | Status: DC
  Administered 2021-08-08 – 2021-08-10 (×4): 3 mL via INTRAVENOUS

## 2021-08-08 MED ORDER — IOHEXOL 350 MG/ML SOLN
INTRAVENOUS | Status: DC | PRN
  Administered 2021-08-08: 86 mL

## 2021-08-08 MED ORDER — HYDRALAZINE HCL 20 MG/ML IJ SOLN: 10.0000 mg | INTRAMUSCULAR | Status: AC | PRN

## 2021-08-08 MED ORDER — HEPARIN (PORCINE) 25000 UT/250ML-% IV SOLN: 950.0000 [IU]/h | INTRAVENOUS | Status: DC

## 2021-08-08 MED ORDER — HYDRALAZINE HCL 20 MG/ML IJ SOLN
INTRAMUSCULAR | Status: DC | PRN
  Administered 2021-08-08 (×2): 10 mg via INTRAVENOUS

## 2021-08-08 MED ORDER — MIDAZOLAM HCL 2 MG/2ML IJ SOLN
INTRAMUSCULAR | Status: DC | PRN
  Administered 2021-08-08: 0.5 mg via INTRAVENOUS

## 2021-08-08 MED ORDER — LIDOCAINE HCL 1 % IJ SOLN
INTRAMUSCULAR | Status: AC
  Filled 2021-08-08: qty 20

## 2021-08-08 MED ORDER — LIDOCAINE HCL (PF) 1 % IJ SOLN
INTRAMUSCULAR | Status: DC | PRN
  Administered 2021-08-08: 15 mL

## 2021-08-08 MED ORDER — FUROSEMIDE 10 MG/ML IJ SOLN
INTRAMUSCULAR | Status: AC
  Administered 2021-08-08: 80 mg via INTRAVENOUS
  Filled 2021-08-08: qty 8

## 2021-08-08 SURGICAL SUPPLY — 18 items
CATH INFINITI 5 FR IM (CATHETERS) ×2 IMPLANT
CATH INFINITI 5 FR MPA2 (CATHETERS) ×2 IMPLANT
CATH INFINITI 5FR JL4 (CATHETERS) ×2 IMPLANT
CATH INFINITI 5FR JL5 (CATHETERS) ×2 IMPLANT
DEVICE RAD TR BAND REGULAR (VASCULAR PRODUCTS) ×2 IMPLANT
DRAPE BRACHIAL (DRAPES) ×2 IMPLANT
GLIDESHEATH SLEND A-KIT 6F 22G (SHEATH) ×2 IMPLANT
GUIDEWIRE INQWIRE 1.5J.035X260 (WIRE) ×1 IMPLANT
INQWIRE 1.5J .035X260CM (WIRE) ×2
KIT MICROPUNCTURE NIT STIFF (SHEATH) ×2 IMPLANT
PACK CARDIAC CATH (CUSTOM PROCEDURE TRAY) ×2 IMPLANT
PROTECTION STATION PRESSURIZED (MISCELLANEOUS) ×2
SET ATX SIMPLICITY (MISCELLANEOUS) ×2 IMPLANT
SHEATH AVANTI 5FR X 11CM (SHEATH) ×2 IMPLANT
SHEATH BRITE TIP 6FR X 23 (SHEATH) ×2 IMPLANT
STATION PROTECTION PRESSURIZED (MISCELLANEOUS) ×1 IMPLANT
WIRE AMPLATZ SS-J .035X180CM (WIRE) ×2 IMPLANT
WIRE GUIDERIGHT .035X150 (WIRE) ×2 IMPLANT

## 2021-08-08 NOTE — Progress Notes (Signed)
Notified Sharion Settler NP of critical lab Troponin > 24,000. Order received to obtain EKG and to also notify cardiologist of lab to keep them updated. Patient VSS. Continues to have SOB but states it is better than it was a few hours ago. Called Dr Acie Fredrickson and notified him of tropinin > 24,000. He states will continue with current plan to cath in am since patient is clinically stable.  Erling Conte, RN

## 2021-08-08 NOTE — ED Notes (Signed)
Consent for cath obtained. 2nd IV started. Pt resting on bipap

## 2021-08-08 NOTE — Progress Notes (Signed)
[facility] PROGRESS NOTE    Jeff Henderson  EVO:[id number] DOB: [birth year] DOA: 08/07/2021 PCP: [facility]      Brief Narrative:  [name] is a 80 y.o. 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  Patient had just worked out on treadmill and eaten a snack when he developed substernal CP, for about 45 minutes.  This waxed and waned and finally he came to the ER.  In the ER, Cr at baseline, HsTrop 300s and in Afib with RVR, brief episode VT, then back to Afib.  Started on amiodarone and heparin infusion and Cardiology consulted.  Incidentally noted to be COVID+.          Assessment & Plan:  NSTEMI - Continue aspirin, atorvastatin, Zetia - Continue heparin gtt - Consult Cardiology, appreciate management      Afib with RVR Chronic atrial fibrillation Non-sustained VT Admitted given Diltiazem, home Coreg and started on amiodarone infusion.  HR down to 50s this mroning and hypotensive. -Continue Coreg, low dose -Hold amiodarone    COVID Mild cough.  CXR clear, no O2.  He is high risk to progress, and we should evaluate possibilities for risk reduction.  No molnupiravir in the hospital.  Paxlovid would reduce efficacy of Plavix, will avoid in the circumstances.  Favor remdesivir over Mab. - Start remdesivir, day 1 of 3 for prevention of progression of COVID    Chronic systolic CHF Peripheral vascular disease, secondary prevention Hypertension No acute CHF symptoms.  EF 30-35%, similar from previous 35-40. BP low until amiodarone stopped -Hold losartan, Imdur, furosemide due to hypotension -Continue Coreg  CKD IIIb Cr stable  MGUS Leukocytosis No suspicion for active infection  Obesity BMI 33        Disposition: Status is: Inpatient  Remains inpatient appropriate  because:Hemodynamically unstable and IV treatments appropriate due to intensity of illness or inability to take PO  Dispo: The patient is from: Home              Anticipated d/c is to: SNF              Patient currently is not medically stable to d/c.   Difficult to place patient No       Level of care: Progressive Cardiac       MDM: The below labs and imaging reports were reviewed and summarized above.  Medication management as above.    DVT prophylaxis: N/A on heparin drip  Code Status: FULL Family Communication: called to son, no answer    Consultants:  Cardiology  Procedures:  9/6 echo -- reduced EF, similar to previous  Antimicrobials:     Culture data:             Subjective: Patient has some low-grade chest discomfort, his heart palpitations have stopped.  He has no orthopnea, leg swelling.  He has a cough.  He has some mild fatigue and aches.  Objective: Vitals:   08/08/21 1200 08/08/21 1230 08/08/21 1300 08/08/21 1330  BP: (!) 145/89 127/73 117/67 133/84  Pulse: 64 (!) 40 (!) 52 69  Resp: 15 16 15 18   Temp:      TempSrc:      SpO2: 99% 100% 100% 97%  Weight:        Intake/Output Summary (Last 24 hours) at 08/08/2021 1415 Last data filed  at 08/08/2021 0600 Gross per 24 hour  Intake 710.34 ml  Output 1000 ml  Net -289.66 ml   Filed Weights   08/07/21 1357  Weight: 104.3 kg    Examination: General appearance: obese elderly adult male, alert and in no acute distress.   HEENT: Anicteric, conjunctiva pink, lids and lashes normal. No nasal deformity, discharge, epistaxis.  Lips moist.   Skin: Warm and dry.  no jaundice.  No suspicious rashes or lesions. Cardiac: RRR, nl S1-S2, no murmurs appreciated.  Capillary refill is brisk.  JVP not visible.  No LE edema.  Radial  pulses 2+ and symmetric. Respiratory: Normal respiratory rate and rhythm.  CTAB without rales or wheezes. Abdomen: Abdomen soft.  no TTP. No ascites, distension,  hepatosplenomegaly.   MSK: No deformities or effusions. Neuro: Awake and alert.  EOMI, moves all extremities neurolysed weakness. Speech fluent.    Psych: Sensorium intact and responding to questions, attention normal. Affect appropriate.  Judgment and insight appear normal.    Data Reviewed: I have personally reviewed following labs and imaging studies:  CBC: Recent Labs  Lab 08/07/21 1122 08/08/21 0700  WBC 13.3* 14.1*  NEUTROABS 11.6*  --   HGB 12.5* 13.5  HCT 37.9* 42.2  MCV 98.7 98.6  PLT 212 297   Basic Metabolic Panel: Recent Labs  Lab 08/07/21 1122 08/08/21 0700  NA 140 141  K 4.6 4.6  CL 107 104  CO2 25 25  GLUCOSE 196* 113*  BUN 50* 50*  CREATININE 1.78* 1.93*  CALCIUM 8.9 8.7*  MG 2.1  --    GFR: Estimated Creatinine Clearance: 35.7 mL/min (A) (by C-G formula based on SCr of 1.93 mg/dL (H)). Liver Function Tests: Recent Labs  Lab 08/07/21 1122  AST 48*  ALT 48*  ALKPHOS 46  BILITOT 0.9  PROT 6.7  ALBUMIN 3.7   No results for input(s): LIPASE, AMYLASE in the last 168 hours. No results for input(s): AMMONIA in the last 168 hours. Coagulation Profile: Recent Labs  Lab 08/07/21 1122  INR 1.1   Cardiac Enzymes: No results for input(s): CKTOTAL, CKMB, CKMBINDEX, TROPONINI in the last 168 hours. BNP (last 3 results) No results for input(s): PROBNP in the last 8760 hours. HbA1C: No results for input(s): HGBA1C in the last 72 hours. CBG: No results for input(s): GLUCAP in the last 168 hours. Lipid Profile: No results for input(s): CHOL, HDL, LDLCALC, TRIG, CHOLHDL, LDLDIRECT in the last 72 hours. Thyroid Function Tests: Recent Labs    08/07/21 1326  TSH 0.584   Anemia Panel: No results for input(s): VITAMINB12, FOLATE, FERRITIN, TIBC, IRON, RETICCTPCT in the last 72 hours. Urine analysis: No results found for: COLORURINE, APPEARANCEUR, LABSPEC, PHURINE, GLUCOSEU, HGBUR, BILIRUBINUR, KETONESUR, PROTEINUR, UROBILINOGEN, NITRITE,  LEUKOCYTESUR Sepsis Labs: @LABRCNTIP (procalcitonin:4,lacticacidven:4)  ) Recent Results (from the past 240 hour(s))  Resp Panel by RT-PCR (Flu A&B, Covid) Nasopharyngeal Swab     Status: Abnormal   Collection Time: 08/07/21 11:22 AM   Specimen: Nasopharyngeal Swab; Nasopharyngeal(NP) swabs in vial transport medium  Result Value Ref Range Status   SARS Coronavirus 2 by RT PCR POSITIVE (A) NEGATIVE Final    Comment: RESULT CALLED TO, READ BACK BY AND VERIFIED WITH: ANNA JASPER 08/07/21 1247 AMK (NOTE) SARS-CoV-2 target nucleic acids are DETECTED.  The SARS-CoV-2 RNA is generally detectable in upper respiratory specimens during the acute phase of infection. Positive results are indicative of the presence of the identified virus, but do not rule out bacterial infection or co-infection with other  pathogens not detected by the test. Clinical correlation with patient history and other diagnostic information is necessary to determine patient infection status. The expected result is Negative.  Fact Sheet for Patients: EntrepreneurPulse.com.au  Fact Sheet for Healthcare Providers: IncredibleEmployment.be  This test is not yet approved or cleared by the [place] FDA and  has been authorized for detection and/or diagnosis of SARS-CoV-2 by FDA under an Emergency Use Authorization (EUA).  This EUA will remain in effect (meaning this test can be used)  for the duration of  the COVID-19 declaration under Section 564(b)(1) of the Act, 21 U.S.C. section 360bbb-3(b)(1), unless the authorization is terminated or revoked sooner.     Influenza A by PCR NEGATIVE NEGATIVE Final   Influenza B by PCR NEGATIVE NEGATIVE Final    Comment: (NOTE) The Xpert Xpress SARS-CoV-2/FLU/RSV plus assay is intended as an aid in the diagnosis of influenza from Nasopharyngeal swab specimens and should not be used as a sole basis for treatment. Nasal washings and aspirates are  unacceptable for Xpert Xpress SARS-CoV-2/FLU/RSV testing.  Fact Sheet for Patients: EntrepreneurPulse.com.au  Fact Sheet for Healthcare Providers: IncredibleEmployment.be  This test is not yet approved or cleared by the [place] FDA and has been authorized for detection and/or diagnosis of SARS-CoV-2 by FDA under an Emergency Use Authorization (EUA). This EUA will remain in effect (meaning this test can be used) for the duration of the COVID-19 declaration under Section 564(b)(1) of the Act, 21 U.S.C. section 360bbb-3(b)(1), unless the authorization is terminated or revoked.  Performed at [facility], [address], [place], [place] [address]          Radiology Studies: DG Chest Portable 1 View  Result Date: 08/07/2021 CLINICAL DATA:  Chest pain EXAM: PORTABLE CHEST 1 VIEW COMPARISON:  Chest radiograph dated 05/06/2019. FINDINGS: The heart is enlarged. Median sternotomy wires are redemonstrated. Vascular calcifications are seen in the aortic arch. The left costophrenic angle is obscured which may reflect a trace pleural effusion with associated atelectasis/airspace disease. The right lung is clear with no pleural effusion. There is no pneumothorax. Degenerative changes are seen in the spine. IMPRESSION: Possible trace left pleural effusion with associated atelectasis/airspace disease. Aortic Atherosclerosis (ICD10-I70.0). Electronically Signed   By: Zerita Boers M.D.   On: 08/07/2021 11:49   ECHOCARDIOGRAM COMPLETE  Result Date: 08/08/2021    ECHOCARDIOGRAM REPORT   Patient Name:   Jeff Henderson Date of Exam: 08/08/2021 Medical Rec #:  [id number]              Height:       69.0 in Accession #:    [id number]             Weight:       230.0 lb Date of Birth:  12-28-1940               BSA:          2.192 m Patient Age:    36 years               BP:           116/71 mmHg Patient Gender: M                      HR:           56 bpm.  Exam Location:  [facility] Procedure: 2D Echo, Color Doppler, Cardiac Doppler and Intracardiac  Opacification Agent Indications:     R07.9 Chest Pain; I21.4 NSTEMI  History:         Patient has prior history of Echocardiogram examinations, most                  recent 12/24/2019. HFrEF, Prior CABG; Risk Factors:Hypertension                  and Dyslipidemia.  Sonographer:     Charmayne Sheer Referring Phys:  [id number] Robin Searing VISSER Diagnosing Phys: Nelva Bush MD  Sonographer Comments: Technically challenging study due to limited acoustic windows. Image acquisition challenging due to patient body habitus and Image acquisition challenging due to respiratory motion. IMPRESSIONS  1. Left ventricular ejection fraction, by estimation, is 30 to 35%. The left ventricle has moderately decreased function. Left ventricular endocardial border not optimally defined to evaluate regional wall motion. The left ventricular internal cavity size was severely dilated. There is mild left ventricular hypertrophy. Left ventricular diastolic parameters are indeterminate.  2. Right ventricular systolic function was not well visualized. The right ventricular size is not well visualized.  3. Left atrial size was moderately dilated.  4. The mitral valve was not well visualized. Mild to moderate mitral valve regurgitation. No evidence of mitral stenosis.  5. The aortic valve was not well visualized. Aortic valve regurgitation is not visualized. No aortic stenosis is present. FINDINGS  Left Ventricle: Left ventricular ejection fraction, by estimation, is 30 to 35%. The left ventricle has moderately decreased function. Left ventricular endocardial border not optimally defined to evaluate regional wall motion. Definity contrast agent was given IV to delineate the left ventricular endocardial borders. The left ventricular internal cavity size was severely dilated. There is mild left ventricular hypertrophy. Left ventricular diastolic  parameters are indeterminate. Right Ventricle: The right ventricular size is not well visualized. Right vetricular wall thickness was not well visualized. Right ventricular systolic function was not well visualized. Left Atrium: Left atrial size was moderately dilated. Right Atrium: Right atrial size was not well visualized. Pericardium: There is no evidence of pericardial effusion. Mitral Valve: The mitral valve was not well visualized. Mild mitral annular calcification. Mild to moderate mitral valve regurgitation. No evidence of mitral valve stenosis. MV peak gradient, 1.6 mmHg. The mean mitral valve gradient is 1.0 mmHg. Tricuspid Valve: The tricuspid valve is not well visualized. Tricuspid valve regurgitation is not demonstrated. Aortic Valve: The aortic valve was not well visualized. Aortic valve regurgitation is not visualized. No aortic stenosis is present. Aortic valve mean gradient measures 2.0 mmHg. Aortic valve peak gradient measures 3.7 mmHg. Aortic valve area, by VTI measures 2.88 cm. Pulmonic Valve: The pulmonic valve was not well visualized. Aorta: The aortic root is normal in size and structure. IAS/Shunts: The interatrial septum was not well visualized.  LEFT VENTRICLE PLAX 2D LVIDd:         7.70 cm  Diastology LVIDs:         6.80 cm  LV e' medial:    3.37 cm/s LV PW:         1.20 cm  LV E/e' medial:  16.4 LV IVS:        1.30 cm  LV e' lateral:   6.85 cm/s LVOT diam:     2.50 cm  LV E/e' lateral: 8.1 LV SV:         60 LV SV Index:   28 LVOT Area:     4.91 cm  LEFT ATRIUM  Index LA Vol (A2C): 107.0 ml 48.80 ml/m  AORTIC VALVE AV Area (Vmax):    3.10 cm AV Area (Vmean):   3.33 cm AV Area (VTI):     2.88 cm AV Vmax:           96.10 cm/s AV Vmean:          67.600 cm/s AV VTI:            0.210 m AV Peak Grad:      3.7 mmHg AV Mean Grad:      2.0 mmHg LVOT Vmax:         60.70 cm/s LVOT Vmean:        45.800 cm/s LVOT VTI:          0.123 m LVOT/AV VTI ratio: 0.59  AORTA Ao Root diam: 3.00 cm  MITRAL VALVE MV Area (PHT): 2.44 cm    SHUNTS MV Area VTI:   2.31 cm    Systemic VTI:  0.12 m MV Peak grad:  1.6 mmHg    Systemic Diam: 2.50 cm MV Mean grad:  1.0 mmHg MV Vmax:       0.64 m/s MV Vmean:      39.2 cm/s MV Decel Time: 311 msec MV E velocity: 55.30 cm/s MV A velocity: 67.70 cm/s MV E/A ratio:  0.82 Matin Mattioli End MD Electronically signed by Nelva Bush MD Signature Date/Time: 08/08/2021/12:06:49 PM    Final         Scheduled Meds:  aspirin  81 mg Oral Daily   atorvastatin  80 mg Oral Daily   carvedilol  3.125 mg Oral BID WC   ezetimibe  10 mg Oral Daily   sodium chloride flush  3 mL Intravenous Q12H   Continuous Infusions:  sodium chloride     heparin 950 Units/hr (08/08/21 0508)   [START ON 08/09/2021] remdesivir 100 mg in NS 100 mL       LOS: 1 day    Time spent: 35 minutes    Edwin Dada, MD [facility] 08/08/2021, 2:15 PM     Please page though AMION or Epic secure chat:  For [employer], Adult nurse

## 2021-08-08 NOTE — Interval H&P Note (Signed)
History and Physical Interval Note:  08/08/2021 4:11 PM  Jeff Henderson  has presented today for surgery, with the diagnosis of Non ST Elevation Myocardial Infarction.  The various methods of treatment have been discussed with the patient and family. After consideration of risks, benefits and other options for treatment, the patient has consented to  Procedure(s): LEFT HEART CATH AND CORS/GRAFTS ANGIOGRAPHY (N/A) as a surgical intervention.  The patient's history has been reviewed, patient examined, no change in status, stable for surgery.  I have reviewed the patient's chart and labs.  Questions were answered to the patient's satisfaction.    Cath Lab Visit (complete for each Cath Lab visit)  Clinical Evaluation Leading to the Procedure:   ACS: Yes.    Non-ACS:  N/A  Jerad Dunlap

## 2021-08-08 NOTE — Progress Notes (Signed)
Progress Note  Patient Name: Jeff Henderson Date of Encounter: 08/08/2021  [facility] Cardiologist: [name], MD   Subjective   Complains of only minimal chest pain this morning, pain now resolved.  No significant shortness of breath.  No palpitations.  Patient noted to have converted to sinus rhythm overnight.  Amiodarone discontinued due to bradycardia.  Inpatient Medications    Scheduled Meds:  [MAR Hold] aspirin  81 mg Oral Daily   [MAR Hold] atorvastatin  80 mg Oral Daily   [MAR Hold] carvedilol  3.125 mg Oral BID WC   [MAR Hold] ezetimibe  10 mg Oral Daily   [MAR Hold] sodium chloride flush  3 mL Intravenous Q12H   Continuous Infusions:  sodium chloride     [START ON 08/09/2021] heparin     [MAR Hold] remdesivir 100 mg in NS 100 mL     PRN Meds: sodium chloride, [MAR Hold] nitroGLYCERIN, sodium chloride flush   Vital Signs    Vitals:   08/08/21 1530 08/08/21 1609 08/08/21 1622 08/08/21 1708  BP: (!) 151/79     Pulse: 63     Resp: (!) 21     Temp:      TempSrc:      SpO2: 98% 98% (!) 89% 100%  Weight:        Intake/Output Summary (Last 24 hours) at 08/08/2021 1819 Last data filed at 08/08/2021 0600 Gross per 24 hour  Intake 510.34 ml  Output 1000 ml  Net -489.66 ml   Last 3 Weights 08/07/2021 03/15/2021 03/01/2021  Weight (lbs) 230 lb 239 lb 243 lb  Weight (kg) 104.327 kg 108.41 kg 110.224 kg      Telemetry    Patient moved overnight and prior telemetry not available.  Telemetry since this morning shows sinus rhythm with PACs and PVCs. - Personally Reviewed  ECG    Sinus rhythm with PACs and inferolateral ST/T changes. - Personally Reviewed  Physical Exam   GEN: No acute distress.   Neck: JVP approximately 8-10 cm. Cardiac: RRR, no murmurs, rubs, or gallops.  Respiratory: Diminished breath sounds throughout, most pronounced at the bases. GI: Soft, nontender, non-distended  MS: Cool upper and lower extremities.  Trace pretibial  edema. Neuro:  Nonfocal  Psych: Normal affect   Labs    High Sensitivity Troponin:   Recent Labs  Lab 08/07/21 1122 08/07/21 1324 08/08/21 0133 08/08/21 0700  TROPONINIHS 324* 1,725* >24,000* >24,000*      Chemistry Recent Labs  Lab 08/07/21 1122 08/08/21 0700  NA 140 141  K 4.6 4.6  CL 107 104  CO2 25 25  GLUCOSE 196* 113*  BUN 50* 50*  CREATININE 1.78* 1.93*  CALCIUM 8.9 8.7*  PROT 6.7  --   ALBUMIN 3.7  --   AST 48*  --   ALT 48*  --   ALKPHOS 46  --   BILITOT 0.9  --   GFRNONAA 38* 34*  ANIONGAP 8 12     Hematology Recent Labs  Lab 08/07/21 1122 08/08/21 0700  WBC 13.3* 14.1*  RBC 3.84* 4.28  HGB 12.5* 13.5  HCT 37.9* 42.2  MCV 98.7 98.6  MCH 32.6 31.5  MCHC 33.0 32.0  RDW 13.7 14.0  PLT 212 196    BNPNo results for input(s): BNP, PROBNP in the last 168 hours.   DDimer No results for input(s): DDIMER in the last 168 hours.   Radiology    CARDIAC CATHETERIZATION  Result Date: 08/08/2021 Conclusions: Severe native  coronary artery disease, as detailed below, which is similar to prior catheterization from 2020. Widely patent LIMA-LAD. Patent sequential SVG-RPDA-distal LCx, though the RPDA/RCA appears to be occluded at the side-side anastomosis, new from 2020. Known to be occluded SVG-OM, not engaged on today's study. Severely elevated left ventricular filling pressure (LVEDP 35 mmHg with significant respiratory variation). Recommendations: Restart IV heparin 8 hours after sheath removal.  Transition to apixaban as soon as tomorrow if no evidence of bleeding/vascular complication. Initiate diuresis with furosemide 80 mg IV twice daily with close monitoring of renal function. Optimize antianginal and evidence-based heart failure therapy. Aggressive secondary prevention of coronary artery disease. Jeff Bush, MD [facility]  DG Chest Portable 1 View  Result Date: 08/07/2021 CLINICAL DATA:  Chest pain EXAM: PORTABLE CHEST 1 VIEW COMPARISON:  Chest  radiograph dated 05/06/2019. FINDINGS: The heart is enlarged. Median sternotomy wires are redemonstrated. Vascular calcifications are seen in the aortic arch. The left costophrenic angle is obscured which may reflect a trace pleural effusion with associated atelectasis/airspace disease. The right lung is clear with no pleural effusion. There is no pneumothorax. Degenerative changes are seen in the spine. IMPRESSION: Possible trace left pleural effusion with associated atelectasis/airspace disease. Aortic Atherosclerosis (ICD10-I70.0). Electronically Signed   By: Zerita Boers M.D.   On: 08/07/2021 11:49   ECHOCARDIOGRAM COMPLETE  Result Date: 08/08/2021    ECHOCARDIOGRAM REPORT   Patient Name:   Jeff Henderson Date of Exam: 08/08/2021 Medical Rec #:  [id number]              Height:       69.0 in Accession #:    [id number]             Weight:       230.0 lb Date of Birth:  Nov 11, 1941               BSA:          2.192 m Patient Age:    80 years               BP:           116/71 mmHg Patient Gender: M                      HR:           56 bpm. Exam Location:  [facility] Procedure: 2D Echo, Color Doppler, Cardiac Doppler and Intracardiac            Opacification Agent Indications:     R07.9 Chest Pain; I21.4 NSTEMI  History:         Patient has prior history of Echocardiogram examinations, most                  recent 12/24/2019. HFrEF, Prior CABG; Risk Factors:Hypertension                  and Dyslipidemia.  Sonographer:     Charmayne Sheer Referring Phys:  [id number] Jeff Henderson Diagnosing Phys: Jeff Bush MD  Sonographer Comments: Technically challenging study due to limited acoustic windows. Image acquisition challenging due to patient body habitus and Image acquisition challenging due to respiratory motion. IMPRESSIONS  1. Left ventricular ejection fraction, by estimation, is 30 to 35%. The left ventricle has moderately decreased function. Left ventricular endocardial border not optimally defined to evaluate  regional wall motion. The left ventricular internal cavity size was severely dilated. There is mild left ventricular hypertrophy. Left ventricular  diastolic parameters are indeterminate.  2. Right ventricular systolic function was not well visualized. The right ventricular size is not well visualized.  3. Left atrial size was moderately dilated.  4. The mitral valve was not well visualized. Mild to moderate mitral valve regurgitation. No evidence of mitral stenosis.  5. The aortic valve was not well visualized. Aortic valve regurgitation is not visualized. No aortic stenosis is present. FINDINGS  Left Ventricle: Left ventricular ejection fraction, by estimation, is 30 to 35%. The left ventricle has moderately decreased function. Left ventricular endocardial border not optimally defined to evaluate regional wall motion. Definity contrast agent was given IV to delineate the left ventricular endocardial borders. The left ventricular internal cavity size was severely dilated. There is mild left ventricular hypertrophy. Left ventricular diastolic parameters are indeterminate. Right Ventricle: The right ventricular size is not well visualized. Right vetricular wall thickness was not well visualized. Right ventricular systolic function was not well visualized. Left Atrium: Left atrial size was moderately dilated. Right Atrium: Right atrial size was not well visualized. Pericardium: There is no evidence of pericardial effusion. Mitral Valve: The mitral valve was not well visualized. Mild mitral annular calcification. Mild to moderate mitral valve regurgitation. No evidence of mitral valve stenosis. MV peak gradient, 1.6 mmHg. The mean mitral valve gradient is 1.0 mmHg. Tricuspid Valve: The tricuspid valve is not well visualized. Tricuspid valve regurgitation is not demonstrated. Aortic Valve: The aortic valve was not well visualized. Aortic valve regurgitation is not visualized. No aortic stenosis is present. Aortic valve  mean gradient measures 2.0 mmHg. Aortic valve peak gradient measures 3.7 mmHg. Aortic valve area, by VTI measures 2.88 cm. Pulmonic Valve: The pulmonic valve was not well visualized. Aorta: The aortic root is normal in size and structure. IAS/Shunts: The interatrial septum was not well visualized.  LEFT VENTRICLE PLAX 2D LVIDd:         7.70 cm  Diastology LVIDs:         6.80 cm  LV e' medial:    3.37 cm/s LV PW:         1.20 cm  LV E/e' medial:  16.4 LV IVS:        1.30 cm  LV e' lateral:   6.85 cm/s LVOT diam:     2.50 cm  LV E/e' lateral: 8.1 LV SV:         60 LV SV Index:   28 LVOT Area:     4.91 cm  LEFT ATRIUM            Index LA Vol (A2C): 107.0 ml 48.80 ml/m  AORTIC VALVE AV Area (Vmax):    3.10 cm AV Area (Vmean):   3.33 cm AV Area (VTI):     2.88 cm AV Vmax:           96.10 cm/s AV Vmean:          67.600 cm/s AV VTI:            0.210 m AV Peak Grad:      3.7 mmHg AV Mean Grad:      2.0 mmHg LVOT Vmax:         60.70 cm/s LVOT Vmean:        45.800 cm/s LVOT VTI:          0.123 m LVOT/AV VTI ratio: 0.59  AORTA Ao Root diam: 3.00 cm MITRAL VALVE MV Area (PHT): 2.44 cm    SHUNTS MV Area VTI:   2.31 cm  Systemic VTI:  0.12 m MV Peak grad:  1.6 mmHg    Systemic Diam: 2.50 cm MV Mean grad:  1.0 mmHg MV Vmax:       0.64 m/s MV Vmean:      39.2 cm/s MV Decel Time: 311 msec MV E velocity: 55.30 cm/s MV A velocity: 67.70 cm/s MV E/A ratio:  0.82 Jeff Gave Summerlynn Glauser MD Electronically signed by Jeff Bush MD Signature Date/Time: 08/08/2021/12:06:49 PM    Final     Cardiac Studies   See above.  Patient Profile     80 y.o. male with h/o coronary artery disease s/p CABG 0981, chronic systolic heart failure, paroxysmal atrial fibrillation on Eliquis, hypertension, subclavian stenosis, hyperlipidemia, carotid artery disease, MGUS, CKD 3, gout, and BPH, admitted with NSTEMI, acute on chronic HFrEF, and atrial fibrillation with rapid ventricular response.  Assessment & Plan    NSTEMI: Chest pain has  resolved.  Catheterization today showed possible new occlusion of RCA supplied via sequential vein graft to RPDA and distal LCx.  Lesion is not amenable to PCI. Restart IV heparin 8 hours after right femoral sheath removal. Continue aspirin, atorvastatin, and ezetimibe.  Acute HFrEF: Echo challenging to interpret due to poor windows but most consistent with LVEF of 30-35% (unchanged from prior).  Severely elevated LVEDP noted during catheterization.  Extremities are cool.  I am concerned about acute HFrEF with possibly worsening low output heart failure.  Of note, the patient received some hydration overnight due to renal insufficiency in anticipation of catheterization but was then switched to furosemide due to worsening shortness of breath. Initiate furosemide 80 mg IV twice daily. Low threshold for adding low-dose milrinone, though I worry that this could precipitate recurrent atrial fibrillation with rapid ventricular response. Continue carvedilol 3.125 mg twice daily. Defer adding ACE inhibitor/ARB in the setting of chronic kidney disease and contrast exposure today.  Paroxysmal atrial fibrillation: Patient maintaining sinus rhythm, now off IV amiodarone due to bradycardia. Continue carvedilol 3.125 mg twice daily. I do not think Mr. Quita Skye will tolerate recurrent atrial fibrillation with rapid ventricular response; I will start him on amiodarone 400 mg p.o. twice daily to complete a 10 g load. Restart IV heparin 8 hours after right femoral artery sheath removal.  Consider transitioning back to apixaban as soon as tomorrow if no evidence of bleeding or vascular injury following today's catheterization.  Chronic kidney disease: Creatinine relatively stable.  Patient received contrast for catheterization today. Diurese as above. Avoid nephrotoxic agents. Low threshold for inotropic support and involvement of nephrology if renal function worsens.    For questions or updates, please contact  [place] Please consult [email or web address] for contact info under [facility].    Signed, Jeff Bush, MD  08/08/2021, 6:19 PM

## 2021-08-08 NOTE — ED Notes (Signed)
Maggie RN aware of assigned bed

## 2021-08-08 NOTE — ED Notes (Signed)
Report given to cardiac progressive unit, RN on unit states pt cannot come there due to being SDU. Assignment changed to ICU

## 2021-08-08 NOTE — Progress Notes (Signed)
During assessment, no work of breathing or respiratory distress noted. Placed bipap on standby and placed patient on 4lnc. He is tolerating well at this time. No complaints noted.  Will continue to monitor

## 2021-08-08 NOTE — Progress Notes (Signed)
   08/08/21 2124  Assess: MEWS Score  Temp 97.8 F (36.6 C)  BP (!) 76/56  ECG Heart Rate 83  Resp 19  Level of Consciousness Alert  SpO2 97 %  O2 Device Nasal Cannula  O2 Flow Rate (L/min) 4 L/min  Assess: MEWS Score  MEWS Temp 0  MEWS Systolic 2  MEWS Pulse 0  MEWS RR 0  MEWS LOC 0  MEWS Score 2  MEWS Score Color Yellow  Assess: if the MEWS score is Yellow or Red  Were vital signs taken at a resting state? Yes  Focused Assessment No change from prior assessment  Does the patient meet 2 or more of the SIRS criteria? No  MEWS guidelines implemented *See Row Information* Yes  Treat  MEWS Interventions Escalated (See documentation below);Other (Comment) (lowered HOB)  Pain Score 0  Take Vital Signs  Increase Vital Sign Frequency  Yellow: Q 2hr X 2 then Q 4hr X 2, if remains yellow, continue Q 4hrs  Escalate  MEWS: Escalate Yellow: discuss with charge nurse/RN and consider discussing with provider and RRT  Notify: Charge Nurse/RN  Name of Charge Nurse/RN Notified Maricar  Date Charge Nurse/RN Notified 08/08/21  Time Charge Nurse/RN Notified 2130  Notify: Provider  Provider Name/Title Randol Kern  Date Provider Notified 08/08/21  Time Provider Notified 2126  Notification Type Page  Notification Reason Change in status  Provider response No new orders;At bedside  Date of Provider Response 08/08/21  Time of Provider Response 2127  Document  Patient Outcome Stabilized after interventions  Progress note created (see row info) Yes  Assess: SIRS CRITERIA  SIRS Temperature  0  SIRS Respirations  0  SIRS WBC 0

## 2021-08-08 NOTE — Consult Note (Signed)
ANTICOAGULATION CONSULT NOTE  Pharmacy Consult for Heparin drip Indication: chest pain/ACS  Patient Measurements: Heparin Dosing Weight: 93kg  Labs: Recent Labs    08/07/21 1122 08/07/21 1324 08/08/21 0133 08/08/21 0700 08/08/21 1330  HGB 12.5*  --   --  13.5  --   HCT 37.9*  --   --  42.2  --   PLT 212  --   --  196  --   APTT 29  --  >160*  --  >160*  LABPROT 14.1  --   --   --   --   INR 1.1  --   --   --   --   HEPARINUNFRC  --   --  >1.10* >1.10*  --   CREATININE 1.78*  --   --  1.93*  --   TROPONINIHS 324* 1,725* >24,000* >24,000*  --      Estimated Creatinine Clearance: 35.7 mL/min (A) (by C-G formula based on SCr of 1.93 mg/dL (H)).   Medical History: Past Medical History:  Diagnosis Date   Arthritis    BPH (benign prostatic hyperplasia)    CAD (coronary artery disease)    a. 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 06/2018 Cath: Native 3VD, 2/3 patent grafts (VG->OM2 100)->Med rx; c. 05/2019 NSTEMI/Cath:  LM nl, LAD 99ost, 159m LCX 1083mRCA 100p, LIMA->LAD ok, VG->RPDA ok (? jump to OM), VG->OM not visualized (prev occluded). LVEDP 3040m-->Med Rx.   CKD (chronic kidney disease), stage III (HCC)    Gout    Gouty arthropathy, chronic, with tophi    Hernia, abdominal    HFrEF (heart failure with reduced ejection fraction) ([place]CC[place]  a. 06/2018 Echo: EF 45-50%, mild MR; b. 05/2019 Echo: EF 30-35%, diff HK.   Hyperlipidemia LDL goal <70    Hypertension    Ischemic cardiomyopathy    a. 06/2018 Echo: EF 45-50%; b. 05/2019 Echo: EF 30-35%, diff HK.   MGUS (monoclonal gammopathy of unknown significance)    Myocardial infarction (HCC)    PAF (paroxysmal atrial fibrillation) ([place]CC[place]  a. Dx 05/2019-->CHA2DS2VASc = 5-->Eliquis.   Renal cyst     Medications:  (Not in a hospital admission)  Assessment: Patient with PMH relevant CAD s/p CABG, HF, Afib (on eliquis), CKD3,subclavian stenosis and hyperlipidemia. Patient presents to ED with chest pain. Pharmacy  consulted to manage heparin gtt for ACS.  Goal of Therapy:  Heparin level 0.3-0.7 units/ml aPTT 66-102 seconds Monitor platelets by anticoagulation protocol: Yes  0906 0133 aPTT >160, supratherapeutic; 1250 un/hr 0906 1330  aPTT > 160, supratherapeutic; 950 un/hr   Plan: --Discussed with RN, lab was drawn from same arm heparin was infusing (drip was paused for draw). Patient refused stick from other arm. Possible erroneous level --Hold heparin x 1 hour --Restart heparin at 700 units/hr after 1 hour hold --Recheck aPTT in 8 hours after restart --Continue to monitor HL daily until correlation with aPTT occurs --CBC daily while on heparin.   CarWynelle ClevelandharmD Pharmacy Resident  08/08/2021 2:56 PM

## 2021-08-08 NOTE — Progress Notes (Signed)
*  PRELIMINARY RESULTS* Echocardiogram 2D Echocardiogram has been performed.  [place] 08/08/2021, 8:34 AM

## 2021-08-08 NOTE — Consult Note (Signed)
ANTICOAGULATION CONSULT NOTE  Pharmacy Consult for Heparin drip Indication: chest pain/ACS  Patient Measurements: Heparin Dosing Weight: 93kg  Labs: Recent Labs    08/07/21 1122 08/07/21 1324 08/08/21 0133 08/08/21 0700 08/08/21 1330  HGB 12.5*  --   --  13.5  --   HCT 37.9*  --   --  42.2  --   PLT 212  --   --  196  --   APTT 29  --  >160*  --  >160*  LABPROT 14.1  --   --   --   --   INR 1.1  --   --   --   --   HEPARINUNFRC  --   --  >1.10* >1.10*  --   CREATININE 1.78*  --   --  1.93*  --   TROPONINIHS 324* 1,725* >24,000* >24,000*  --      Estimated Creatinine Clearance: 35.7 mL/min (A) (by C-G formula based on SCr of 1.93 mg/dL (H)).   Medical History: Past Medical History:  Diagnosis Date   Arthritis    BPH (benign prostatic hyperplasia)    CAD (coronary artery disease)    a. 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 06/2018 Cath: Native 3VD, 2/3 patent grafts (VG->OM2 100)->Med rx; c. 05/2019 NSTEMI/Cath:  LM nl, LAD 99ost, 132m LCX 1082mRCA 100p, LIMA->LAD ok, VG->RPDA ok (? jump to OM), VG->OM not visualized (prev occluded). LVEDP 3061m-->Med Rx.   CKD (chronic kidney disease), stage III (HCC)    Gout    Gouty arthropathy, chronic, with tophi    Hernia, abdominal    HFrEF (heart failure with reduced ejection fraction) (HCCP[place]  a. 06/2018 Echo: EF 45-50%, mild MR; b. 05/2019 Echo: EF 30-35%, diff HK.   Hyperlipidemia LDL goal <70    Hypertension    Ischemic cardiomyopathy    a. 06/2018 Echo: EF 45-50%; b. 05/2019 Echo: EF 30-35%, diff HK.   MGUS (monoclonal gammopathy of unknown significance)    Myocardial infarction (HCC)    PAF (paroxysmal atrial fibrillation) ([place]CC[place]  a. Dx 05/2019-->CHA2DS2VASc = 5-->Eliquis.   Renal cyst     Medications:  Medications Prior to Admission  Medication Sig Dispense Refill Last Dose   acetaminophen (TYLENOL) 500 MG tablet Take 500 mg by mouth every 6 (six) hours as needed for moderate pain, headache or mild  pain.   PRN at PRN   amLODipine-benazepril (LOTREL) 5-20 MG capsule Take 1 capsule by mouth daily.   08/07/2021   atorvastatin (LIPITOR) 80 MG tablet Take 80 mg by mouth daily.    08/07/2021   carvedilol (COREG) 25 MG tablet Take 1 tablet (25 mg total) by mouth 2 (two) times daily with a meal. 180 tablet 3 08/07/2021   Coenzyme Q10 (COQ10) 100 MG CAPS Take 100 mg by mouth daily.   08/07/2021   ELIQUIS 2.5 MG TABS tablet TAKE 1 TABLET(2.5 MG) BY MOUTH TWICE DAILY 180 tablet 1 08/07/2021 at AM   ezetimibe (ZETIA) 10 MG tablet TAKE 1 TABLET(10 MG) BY MOUTH DAILY 90 tablet 1 08/07/2021   famotidine (PEPCID) 20 MG tablet Take 20 mg by mouth 2 (two) times daily.   08/07/2021   furosemide (LASIX) 20 MG tablet TAKE 1 TABLET(20 MG) BY MOUTH DAILY 90 tablet 3 08/07/2021   isosorbide mononitrate (IMDUR) 60 MG 24 hr tablet TAKE 1 TABLET(60 MG) BY MOUTH DAILY 90 tablet 0 08/07/2021   Multiple Vitamins-Minerals (MENS 50+ MULTI VITAMIN/MIN) TABS Take 1  tablet by mouth daily.    08/07/2021   Omega-3 1000 MG CAPS Take 1,000 mg by mouth daily.    08/07/2021   predniSONE (STERAPRED UNI-PAK 21 TAB) 10 MG (21) TBPK tablet See admin instructions. follow package directions   08/07/2021 at AM   nitroGLYCERIN (NITROSTAT) 0.4 MG SL tablet Place 1 tablet (0.4 mg total) under the tongue every 5 (five) minutes as needed for chest pain (Nitro if taking third dose, call 911). 25 tablet 3 PRN at PRN    Assessment: Patient with PMH relevant CAD s/p CABG, HF, Afib (on eliquis), CKD3,subclavian stenosis and hyperlipidemia. Patient presents to ED with chest pain. Pharmacy consulted to manage heparin gtt for ACS.  Goal of Therapy:  Heparin level 0.3-0.7 units/ml aPTT 66-102 seconds Monitor platelets by anticoagulation protocol: Yes  0906 0133 aPTT >160, supratherapeutic; 1250 un/hr 0906 1330  aPTT > 160, supratherapeutic; 950 un/hr 0906 1600 heparin infusion stopped for cath procedure   Plan: --Resume heparin at 700 units/hr in 8 hours following  sheath removal per cardiology. --Restart heparin at 700 units/hr 9/7 at 0200 --Recheck aPTT/HL in 8 hours after restart --Continue to monitor HL daily until correlation with aPTT occurs --CBC daily while on heparin.   Dorothe Pea, PharmD, BCPS Clinical Pharmacist   08/08/2021 6:07 PM

## 2021-08-08 NOTE — ED Notes (Signed)
Attending at bedside.

## 2021-08-08 NOTE — Consult Note (Addendum)
ANTICOAGULATION CONSULT NOTE - Initial Consult  Pharmacy Consult for Heparin drip Indication: chest pain/ACS  Allergies  Allergen Reactions   Penicillins Rash and Other (See Comments)    Has patient had a PCN reaction causing immediate rash, facial/tongue/throat swelling, SOB or lightheadedness with hypotension: No Has patient had a PCN reaction causing severe rash involving mucus membranes or skin necrosis: No Has patient had a PCN reaction that required hospitalization: No Has patient had a PCN reaction occurring within the last 10 years: No If all of the above answers are "NO", then may proceed with Cephalosporin use.     Patient Measurements: Weight: 104.3 kg (230 lb) Heparin Dosing Weight: 93kg  Vital Signs: BP: 116/71 (09/05 2301) Pulse Rate: 62 (09/06 0159)  Labs: Recent Labs    08/07/21 1122 08/07/21 1324 08/08/21 0133  HGB 12.5*  --   --   HCT 37.9*  --   --   PLT 212  --   --   APTT 29  --  >160*  LABPROT 14.1  --   --   INR 1.1  --   --   HEPARINUNFRC  --   --  >1.10*  CREATININE 1.78*  --   --   TROPONINIHS 324* 1,725* >24,000*     Estimated Creatinine Clearance: 38.7 mL/min (A) (by C-G formula based on SCr of 1.78 mg/dL (H)).   Medical History: Past Medical History:  Diagnosis Date   Arthritis    BPH (benign prostatic hyperplasia)    CAD (coronary artery disease)    a. 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 06/2018 Cath: Native 3VD, 2/3 patent grafts (VG->OM2 100)->Med rx; c. 05/2019 NSTEMI/Cath:  LM nl, LAD 99ost, 119m LCX 1051mRCA 100p, LIMA->LAD ok, VG->RPDA ok (? jump to OM), VG->OM not visualized (prev occluded). LVEDP 303m-->Med Rx.   CKD (chronic kidney disease), stage III (HCC)    Gout    Gouty arthropathy, chronic, with tophi    Hernia, abdominal    HFrEF (heart failure with reduced ejection fraction) ([place]CC[place]  a. 06/2018 Echo: EF 45-50%, mild MR; b. 05/2019 Echo: EF 30-35%, diff HK.   Hyperlipidemia LDL goal <70     Hypertension    Ischemic cardiomyopathy    a. 06/2018 Echo: EF 45-50%; b. 05/2019 Echo: EF 30-35%, diff HK.   MGUS (monoclonal gammopathy of unknown significance)    Myocardial infarction (HCC)    PAF (paroxysmal atrial fibrillation) ([place]CCB[place]  a. Dx 05/2019-->CHA2DS2VASc = 5-->Eliquis.   Renal cyst     Medications:  (Not in a hospital admission)  Assessment: Patient with PMH relevant CAD s/p CABG, HF, Afib (on eliquis), CKD3,subclavian stenosis and hyperlipidemia. Patient presents to ED with chest pain. Pharmacy consulted to manage heparin gtt for ACS.  Goal of Therapy:  Heparin level 0.3-0.7 units/ml aPTT 66-102 seconds Monitor platelets by anticoagulation protocol: Yes  0906 0133 aPTT >160, HL >1.1, supratherapeutic   Plan: ConCareers information officerPer RN, instructed another nurse which arm to draw lab from.  Instructed RN to hold heparin drip for 1 hour. Restart heparin at 950 units/hr after 1 hr hold Recheck aPTT in 8 hours after restart Continue to monitor HL daily until correlation with aPTT occurs CBC daily while on heparin.  NatRenda RollsharmD, MBAPhoebe Worth Medical Center6/2022 4:17 AM

## 2021-08-08 NOTE — ED Notes (Signed)
Pt taken to cath lab with RN. All belongings taken including dentures and cell phone.

## 2021-08-08 NOTE — ED Notes (Signed)
Echo at bedside

## 2021-08-08 NOTE — Progress Notes (Addendum)
Received patient from cath lab, assessment completed, VS documented, oriented patient to the room.  Will continue to monitor.  Frequent VS obtained.

## 2021-08-08 NOTE — ED Notes (Signed)
Attempted to call report to ICU, RN was told charge RN would call back

## 2021-08-09 ENCOUNTER — Encounter: Payer: Self-pay | Admitting: Internal Medicine

## 2021-08-09 LAB — CBC
HCT: 40.1 % (ref 39.0–52.0)
Hemoglobin: 13.8 g/dL (ref 13.0–17.0)
MCH: 33.2 pg (ref 26.0–34.0)
MCHC: 34.4 g/dL (ref 30.0–36.0)
MCV: 96.4 fL (ref 80.0–100.0)
Platelets: 167 10*3/uL (ref 150–400)
RBC: 4.16 MIL/uL — ABNORMAL LOW (ref 4.22–5.81)
RDW: 14.2 % (ref 11.5–15.5)
WBC: 11.7 10*3/uL — ABNORMAL HIGH (ref 4.0–10.5)
nRBC: 0 % (ref 0.0–0.2)

## 2021-08-09 LAB — BASIC METABOLIC PANEL
Anion gap: 10 (ref 5–15)
BUN: 55 mg/dL — ABNORMAL HIGH (ref 8–23)
CO2: 26 mmol/L (ref 22–32)
Calcium: 8.5 mg/dL — ABNORMAL LOW (ref 8.9–10.3)
Chloride: 104 mmol/L (ref 98–111)
Creatinine, Ser: 1.87 mg/dL — ABNORMAL HIGH (ref 0.61–1.24)
GFR, Estimated: 36 mL/min — ABNORMAL LOW (ref >60)
Glucose, Bld: 94 mg/dL (ref 70–99)
Potassium: 3.8 mmol/L (ref 3.5–5.1)
Sodium: 140 mmol/L (ref 135–145)

## 2021-08-09 LAB — HEPARIN LEVEL (UNFRACTIONATED)
Heparin Unfractionated: 0.58 IU/mL (ref 0.30–0.70)
Heparin Unfractionated: 0.57 IU/mL (ref 0.30–0.70)

## 2021-08-09 LAB — C-REACTIVE PROTEIN: CRP: 12.4 mg/dL — ABNORMAL HIGH (ref <1.0)

## 2021-08-09 LAB — APTT: aPTT: 56 seconds — ABNORMAL HIGH (ref 24–36)

## 2021-08-09 NOTE — Consult Note (Deleted)
ANTICOAGULATION CONSULT NOTE  Pharmacy Consult for Heparin drip Indication: chest pain/ACS  Patient Measurements: Heparin Dosing Weight: 93kg  Labs: Recent Labs    08/07/21 1122 08/07/21 1122 08/07/21 1324 08/08/21 0133 08/08/21 0700 08/08/21 1330 08/09/21 0652 08/09/21 1015 08/09/21 1740  HGB 12.5*  --   --   --  13.5  --  13.8  --   --   HCT 37.9*  --   --   --  42.2  --  40.1  --   --   PLT 212  --   --   --  196  --  167  --   --   APTT 29  --   --  >160*  --  >160*  --  56*  --   LABPROT 14.1  --   --   --   --   --   --   --   --   INR 1.1  --   --   --   --   --   --   --   --   HEPARINUNFRC  --    < >  --  >1.10* >1.10*  --   --  0.58 0.57  CREATININE 1.78*  --   --   --  1.93*  --  1.87*  --   --   TROPONINIHS 324*  --  1,725* >24,000* >24,000*  --   --   --   --    < > = values in this interval not displayed.     Estimated Creatinine Clearance: 38.7 mL/min (A) (by C-G formula based on SCr of 1.87 mg/dL (H)).   Medical History: Past Medical History:  Diagnosis Date   Arthritis    BPH (benign prostatic hyperplasia)    CAD (coronary artery disease)    a. 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 06/2018 Cath: Native 3VD, 2/3 patent grafts (VG->OM2 100)->Med rx; c. 05/2019 NSTEMI/Cath:  LM nl, LAD 99ost, 176m LCX 1062mRCA 100p, LIMA->LAD ok, VG->RPDA ok (? jump to OM), VG->OM not visualized (prev occluded). LVEDP 3035m-->Med Rx.   CKD (chronic kidney disease), stage III (HCC)    Gout    Gouty arthropathy, chronic, with tophi    Hernia, abdominal    [place]EF (heart [place] with reduced [place] fraction) [place]  a. 06/2018 Echo: EF 45-50%, mild MR; b. 05/2019 Echo: EF 30-35%, diff HK.   Hyperlipidemia LDL goal <70    Hypertension    Ischemic cardiomyopathy    a. 06/2018 Echo: EF 45-50%; b. 05/2019 Echo: EF 30-35%, diff HK.   MGUS (monoclonal gammopathy of unknown significance)    Myocardial infarction (HCC)    PAF (paroxysmal atrial fibrillation)  ([place]  a. Dx 05/2019-->CHA2DS2VASc = 5-->Eliquis.   Renal cyst     Medications:  Medications Prior to Admission  Medication Sig Dispense Refill Last Dose   acetaminophen (TYLENOL) 500 MG tablet Take 500 mg by mouth every 6 (six) hours as needed for moderate pain, headache or mild pain.   PRN at PRN   amLODipine-benazepril (LOTREL) 5-20 MG capsule Take 1 capsule by mouth daily.   08/07/2021   atorvastatin (LIPITOR) 80 MG tablet Take 80 mg by mouth daily.    08/07/2021   carvedilol (COREG) 25 MG tablet Take 1 tablet (25 mg total) by mouth 2 (two) times daily with a meal. 180 tablet 3 08/07/2021   Coenzyme Q10 (COQ10) 100 MG CAPS Take 100 mg  by mouth daily.   08/07/2021   ELIQUIS 2.5 MG TABS tablet TAKE 1 TABLET(2.5 MG) BY MOUTH TWICE DAILY 180 tablet 1 08/07/2021 at AM   ezetimibe (ZETIA) 10 MG tablet TAKE 1 TABLET(10 MG) BY MOUTH DAILY 90 tablet 1 08/07/2021   famotidine (PEPCID) 20 MG tablet Take 20 mg by mouth 2 (two) times daily.   08/07/2021   furosemide (LASIX) 20 MG tablet TAKE 1 TABLET(20 MG) BY MOUTH DAILY 90 tablet 3 08/07/2021   isosorbide mononitrate (IMDUR) 60 MG 24 hr tablet TAKE 1 TABLET(60 MG) BY MOUTH DAILY 90 tablet 0 08/07/2021   Multiple Vitamins-Minerals (MENS 50+ MULTI VITAMIN/MIN) TABS Take 1 tablet by mouth daily.    08/07/2021   Omega-3 1000 MG CAPS Take 1,000 mg by mouth daily.    08/07/2021   predniSONE (STERAPRED UNI-PAK 21 TAB) 10 MG (21) TBPK tablet See admin instructions. follow package directions   08/07/2021 at AM   nitroGLYCERIN (NITROSTAT) 0.4 MG SL tablet Place 1 tablet (0.4 mg total) under the tongue every 5 (five) minutes as needed for chest pain (Nitro if taking third dose, call 911). 25 tablet 3 PRN at PRN    Assessment: Patient with PMH relevant CAD s/p CABG, HF, Afib (on eliquis), CKD3,subclavian stenosis and hyperlipidemia. Patient presents to ED with chest pain. Pharmacy consulted to manage heparin gtt for ACS.  Heparin Dosing Weight: 93kg  Goal of Therapy:  Heparin  level 0.3-0.7 units/ml aPTT 66-102 seconds Monitor platelets by anticoagulation protocol: Yes  Date Time aPTT/HL Rate/Comment 0906 0133 aPTT >160  supratherapeutic; 1250>950 un/hr 0906  1330   aPTT >160  supratherapeutic; 950>700 un/hr 0906  1600     -- / --  Heparin stopped for cath procedure.    4827 0203     -- / --  Heparin restarted 8h post sheath removal. 0907 1015 56s / 0.58 Thera x1; seeing correlation trend anti-Xa's 0907  1740 HL 0.57 Therapeutic x 2  Baseline Labs: aPTT - 29s INR - 1.1 Hgb - 12.5>13.5>13.8 Plts - [phone]   Plan: Heparin level remains  therapeutic (0.57; goal 0.3-0.7);  --Continue heparin at 700 units/hr  --Recheck HL daily with AM labs --CBC daily while on heparin.  Dorothe Pea, PharmD, BCPS Clinical Pharmacist   08/09/2021 7:17 PM

## 2021-08-09 NOTE — Progress Notes (Signed)
PROGRESS NOTE    Jeff Henderson  QIH:[id number] DOB: Mar 27, 1941 DOA: 08/07/2021 PCP: [facility]   Assessment & Plan:   Principal Problem:   NSTEMI (non-ST elevated myocardial infarction) ([place]) Active Problems:   CKD (chronic kidney disease), stage III ([place])   Hyperlipidemia LDL goal <70   Atrial fibrillation with RVR (HCC)   Non-sustained ventricular tachycardia (HCC)   Hypotension   Acute on chronic HFrEF (heart failure with reduced ejection fraction) ([place])   COVID-19 virus infection   NSTEMI: s/p cardiac cath on 08/08/21 which showed possible new occlusion of RCA and lesion not amenable to PCI. Continue on IV heparin drip. Continue on aspirin, statin & zetia as per cardio. Continue on tele   Chronic atrial fibrillation: continue on carvedilol, amiodarone. Continue to hold eliquis while on IV heparin drip as per cardio   COVID19 infection: w/ mild cough. Paxlovid would reduce efficacy of Plavix, will avoid in the circumstances.Continue on IV remdesivir   Chronic systolic CHF: continue on coreg. Continue to hold losartan, imdur lasix    CKDIIIb: Cr is trending down from day prior. Avoid nephrotoxic meds    MGUS: continue w/ supportive care    Obesity: BMI 33.9. Complicates overall care and prognosis     DVT prophylaxis: heparin drip  Code Status: full  Family Communication:  Disposition Plan:  depends on PT/OT recs (not consulted yet  Level of care: Progressive Cardiac Consultants:  Cardio  Procedures: cardiac cath   Antimicrobials:    Subjective: Pt c/o fatigue   Objective: Vitals:   08/08/21 2317 08/09/21 0000 08/09/21 0353 08/09/21 0804  BP: 113/60 115/68 (!) 117/59 112/70  Pulse:    63  Resp: 19 (!) 21 16 20   Temp: 98 F (36.7 C)  98.1 F (36.7 C) 98 F (36.7 C)  TempSrc: Oral  Oral Oral  SpO2: 96%  96% 97%  Weight:        Intake/Output Summary (Last 24 hours) at 08/09/2021 0911 Last data filed at 08/09/2021 0800 Gross per 24  hour  Intake 867.65 ml  Output 1625 ml  Net -757.35 ml   Filed Weights   08/07/21 1357  Weight: 104.3 kg    Examination:  General exam: Appears calm and comfortable  Respiratory system: Clear to auscultation. Respiratory effort normal. Cardiovascular system: S1 & S2 +. No  rubs, gallops or clicks.  Gastrointestinal system: Abdomen is obese, soft and nontender. Normal bowel sounds heard. Central nervous system: Alert and oriented. Moves all extremities Psychiatry: Judgement and insight appear normal. Flat mood and affect    Data Reviewed: I have personally reviewed following labs and imaging studies  CBC: Recent Labs  Lab 08/07/21 1122 08/08/21 0700 08/09/21 0652  WBC 13.3* 14.1* 11.7*  NEUTROABS 11.6*  --   --   HGB 12.5* 13.5 13.8  HCT 37.9* 42.2 40.1  MCV 98.7 98.6 96.4  PLT 212 196 875   Basic Metabolic Panel: Recent Labs  Lab 08/07/21 1122 08/08/21 0700 08/09/21 0652  NA 140 141 140  K 4.6 4.6 3.8  CL 107 104 104  CO2 25 25 26   GLUCOSE 196* 113* 94  BUN 50* 50* 55*  CREATININE 1.78* 1.93* 1.87*  CALCIUM 8.9 8.7* 8.5*  MG 2.1  --   --    GFR: Estimated Creatinine Clearance: 36.9 mL/min (A) (by C-G formula based on SCr of 1.87 mg/dL (H)). Liver Function Tests: Recent Labs  Lab 08/07/21 1122  AST 48*  ALT 48*  ALKPHOS  46  BILITOT 0.9  PROT 6.7  ALBUMIN 3.7   No results for input(s): LIPASE, AMYLASE in the last 168 hours. No results for input(s): AMMONIA in the last 168 hours. Coagulation Profile: Recent Labs  Lab 08/07/21 1122  INR 1.1   Cardiac Enzymes: No results for input(s): CKTOTAL, CKMB, CKMBINDEX, TROPONINI in the last 168 hours. BNP (last 3 results) No results for input(s): PROBNP in the last 8760 hours. HbA1C: No results for input(s): HGBA1C in the last 72 hours. CBG: No results for input(s): GLUCAP in the last 168 hours. Lipid Profile: No results for input(s): CHOL, HDL, LDLCALC, TRIG, CHOLHDL, LDLDIRECT in the last 72  hours. Thyroid Function Tests: Recent Labs    08/07/21 1326  TSH 0.584   Anemia Panel: No results for input(s): VITAMINB12, FOLATE, FERRITIN, TIBC, IRON, RETICCTPCT in the last 72 hours. Sepsis Labs: No results for input(s): PROCALCITON, LATICACIDVEN in the last 168 hours.  Recent Results (from the past 240 hour(s))  Resp Panel by RT-PCR (Flu A&B, Covid) Nasopharyngeal Swab     Status: Abnormal   Collection Time: 08/07/21 11:22 AM   Specimen: Nasopharyngeal Swab; Nasopharyngeal(NP) swabs in vial transport medium  Result Value Ref Range Status   SARS Coronavirus 2 by RT PCR POSITIVE (A) NEGATIVE Final    Comment: RESULT CALLED TO, READ BACK BY AND VERIFIED WITH: ANNA JASPER 08/07/21 1247 AMK (NOTE) SARS-CoV-2 target nucleic acids are DETECTED.  The SARS-CoV-2 RNA is generally detectable in upper respiratory specimens during the acute phase of infection. Positive results are indicative of the presence of the identified virus, but do not rule out bacterial infection or co-infection with other pathogens not detected by the test. Clinical correlation with patient history and other diagnostic information is necessary to determine patient infection status. The expected result is Negative.  Fact Sheet for Patients: EntrepreneurPulse.com.au  Fact Sheet for Healthcare Providers: IncredibleEmployment.be  This test is not yet approved or cleared by the [place] FDA and  has been authorized for detection and/or diagnosis of SARS-CoV-2 by FDA under an Emergency Use Authorization (EUA).  This EUA will remain in effect (meaning this test can be used)  for the duration of  the COVID-19 declaration under Section 564(b)(1) of the Act, 21 U.S.C. section 360bbb-3(b)(1), unless the authorization is terminated or revoked sooner.     Influenza A by PCR NEGATIVE NEGATIVE Final   Influenza B by PCR NEGATIVE NEGATIVE Final    Comment: (NOTE) The Xpert  Xpress SARS-CoV-2/FLU/RSV plus assay is intended as an aid in the diagnosis of influenza from Nasopharyngeal swab specimens and should not be used as a sole basis for treatment. Nasal washings and aspirates are unacceptable for Xpert Xpress SARS-CoV-2/FLU/RSV testing.  Fact Sheet for Patients: EntrepreneurPulse.com.au  Fact Sheet for Healthcare Providers: IncredibleEmployment.be  This test is not yet approved or cleared by the [place] FDA and has been authorized for detection and/or diagnosis of SARS-CoV-2 by FDA under an Emergency Use Authorization (EUA). This EUA will remain in effect (meaning this test can be used) for the duration of the COVID-19 declaration under Section 564(b)(1) of the Act, 21 U.S.C. section 360bbb-3(b)(1), unless the authorization is terminated or revoked.  Performed at [facility], [address], [place], [place] [address]          Radiology Studies: CARDIAC CATHETERIZATION  Result Date: 08/08/2021 Conclusions: Severe native coronary artery disease, as detailed below, which is similar to prior catheterization from 2020. Widely patent LIMA-LAD. Patent sequential SVG-RPDA-distal LCx,  though the RPDA/RCA appears to be occluded at the side-side anastomosis, new from 2020. Known to be occluded SVG-OM, not engaged on today's study. Severely elevated left ventricular filling pressure (LVEDP 35 mmHg with significant respiratory variation). Recommendations: Restart IV heparin 8 hours after sheath removal.  Transition to apixaban as soon as tomorrow if no evidence of bleeding/vascular complication. Initiate diuresis with furosemide 80 mg IV twice daily with close monitoring of renal function. Optimize antianginal and evidence-based heart failure therapy. Aggressive secondary prevention of coronary artery disease. Nelva Bush, MD [facility]  DG Chest Portable 1 View  Result Date: 08/07/2021 CLINICAL DATA:   Chest pain EXAM: PORTABLE CHEST 1 VIEW COMPARISON:  Chest radiograph dated 05/06/2019. FINDINGS: The heart is enlarged. Median sternotomy wires are redemonstrated. Vascular calcifications are seen in the aortic arch. The left costophrenic angle is obscured which may reflect a trace pleural effusion with associated atelectasis/airspace disease. The right lung is clear with no pleural effusion. There is no pneumothorax. Degenerative changes are seen in the spine. IMPRESSION: Possible trace left pleural effusion with associated atelectasis/airspace disease. Aortic Atherosclerosis (ICD10-I70.0). Electronically Signed   By: Zerita Boers M.D.   On: 08/07/2021 11:49   ECHOCARDIOGRAM COMPLETE  Result Date: 08/08/2021    ECHOCARDIOGRAM REPORT   Patient Name:   HARALD QUEVEDO Date of Exam: 08/08/2021 Medical Rec #:  [id number]              Height:       69.0 in Accession #:    [id number]             Weight:       230.0 lb Date of Birth:  Feb 18, 1941               BSA:          2.192 m Patient Age:    80 years               BP:           116/71 mmHg Patient Gender: M                      HR:           56 bpm. Exam Location:  [facility] Procedure: 2D Echo, Color Doppler, Cardiac Doppler and Intracardiac            Opacification Agent Indications:     R07.9 Chest Pain; I21.4 NSTEMI  History:         Patient has prior history of Echocardiogram examinations, most                  recent 12/24/2019. HFrEF, Prior CABG; Risk Factors:Hypertension                  and Dyslipidemia.  Sonographer:     Charmayne Sheer Referring Phys:  [id number] Robin Searing VISSER Diagnosing Phys: Nelva Bush MD  Sonographer Comments: Technically challenging study due to limited acoustic windows. Image acquisition challenging due to patient body habitus and Image acquisition challenging due to respiratory motion. IMPRESSIONS  1. Left ventricular ejection fraction, by estimation, is 30 to 35%. The left ventricle has moderately decreased function. Left  ventricular endocardial border not optimally defined to evaluate regional wall motion. The left ventricular internal cavity size was severely dilated. There is mild left ventricular hypertrophy. Left ventricular diastolic parameters are indeterminate.  2. Right ventricular systolic function was not well visualized. The right ventricular size is not well  visualized.  3. Left atrial size was moderately dilated.  4. The mitral valve was not well visualized. Mild to moderate mitral valve regurgitation. No evidence of mitral stenosis.  5. The aortic valve was not well visualized. Aortic valve regurgitation is not visualized. No aortic stenosis is present. FINDINGS  Left Ventricle: Left ventricular ejection fraction, by estimation, is 30 to 35%. The left ventricle has moderately decreased function. Left ventricular endocardial border not optimally defined to evaluate regional wall motion. Definity contrast agent was given IV to delineate the left ventricular endocardial borders. The left ventricular internal cavity size was severely dilated. There is mild left ventricular hypertrophy. Left ventricular diastolic parameters are indeterminate. Right Ventricle: The right ventricular size is not well visualized. Right vetricular wall thickness was not well visualized. Right ventricular systolic function was not well visualized. Left Atrium: Left atrial size was moderately dilated. Right Atrium: Right atrial size was not well visualized. Pericardium: There is no evidence of pericardial effusion. Mitral Valve: The mitral valve was not well visualized. Mild mitral annular calcification. Mild to moderate mitral valve regurgitation. No evidence of mitral valve stenosis. MV peak gradient, 1.6 mmHg. The mean mitral valve gradient is 1.0 mmHg. Tricuspid Valve: The tricuspid valve is not well visualized. Tricuspid valve regurgitation is not demonstrated. Aortic Valve: The aortic valve was not well visualized. Aortic valve regurgitation  is not visualized. No aortic stenosis is present. Aortic valve mean gradient measures 2.0 mmHg. Aortic valve peak gradient measures 3.7 mmHg. Aortic valve area, by VTI measures 2.88 cm. Pulmonic Valve: The pulmonic valve was not well visualized. Aorta: The aortic root is normal in size and structure. IAS/Shunts: The interatrial septum was not well visualized.  LEFT VENTRICLE PLAX 2D LVIDd:         7.70 cm  Diastology LVIDs:         6.80 cm  LV e' medial:    3.37 cm/s LV PW:         1.20 cm  LV E/e' medial:  16.4 LV IVS:        1.30 cm  LV e' lateral:   6.85 cm/s LVOT diam:     2.50 cm  LV E/e' lateral: 8.1 LV SV:         60 LV SV Index:   28 LVOT Area:     4.91 cm  LEFT ATRIUM            Index LA Vol (A2C): 107.0 ml 48.80 ml/m  AORTIC VALVE AV Area (Vmax):    3.10 cm AV Area (Vmean):   3.33 cm AV Area (VTI):     2.88 cm AV Vmax:           96.10 cm/s AV Vmean:          67.600 cm/s AV VTI:            0.210 m AV Peak Grad:      3.7 mmHg AV Mean Grad:      2.0 mmHg LVOT Vmax:         60.70 cm/s LVOT Vmean:        45.800 cm/s LVOT VTI:          0.123 m LVOT/AV VTI ratio: 0.59  AORTA Ao Root diam: 3.00 cm MITRAL VALVE MV Area (PHT): 2.44 cm    SHUNTS MV Area VTI:   2.31 cm    Systemic VTI:  0.12 m MV Peak grad:  1.6 mmHg    Systemic Diam: 2.50 cm  MV Mean grad:  1.0 mmHg MV Vmax:       0.64 m/s MV Vmean:      39.2 cm/s MV Decel Time: 311 msec MV E velocity: 55.30 cm/s MV A velocity: 67.70 cm/s MV E/A ratio:  0.82 Christopher End MD Electronically signed by Nelva Bush MD Signature Date/Time: 08/08/2021/12:06:49 PM    Final         Scheduled Meds:  amiodarone  400 mg Oral BID   aspirin  81 mg Oral Daily   atorvastatin  80 mg Oral Daily   carvedilol  3.125 mg Oral BID WC   ezetimibe  10 mg Oral Daily   furosemide  80 mg Intravenous BID   influenza vaccine adjuvanted  0.5 mL Intramuscular Tomorrow-1000   sodium chloride flush  3 mL Intravenous Q12H   sodium chloride flush  3 mL Intravenous Q12H    Continuous Infusions:  sodium chloride     heparin 700 Units/hr (08/09/21 0203)   remdesivir 100 mg in NS 100 mL       LOS: 2 days    Time spent: 32 mins     Wyvonnia Dusky, MD [facility] Pager [phone]   08/09/2021, 9:11 AM

## 2021-08-09 NOTE — Progress Notes (Signed)
Progress Note  Patient Name: Jeff Henderson Date of Encounter: 08/09/2021  [facility] Cardiologist: [name], MD   Subjective   Feels well, denies chest pain or shortness of breath.  Inpatient Medications    Scheduled Meds:  amiodarone  400 mg Oral BID   aspirin  81 mg Oral Daily   atorvastatin  80 mg Oral Daily   carvedilol  3.125 mg Oral BID WC   ezetimibe  10 mg Oral Daily   furosemide  80 mg Intravenous BID   influenza vaccine adjuvanted  0.5 mL Intramuscular Tomorrow-1000   sodium chloride flush  3 mL Intravenous Q12H   sodium chloride flush  3 mL Intravenous Q12H   Continuous Infusions:  sodium chloride     heparin 700 Units/hr (08/09/21 0203)   remdesivir 100 mg in NS 100 mL     PRN Meds: sodium chloride, acetaminophen, nitroGLYCERIN, ondansetron (ZOFRAN) IV, sodium chloride flush   Vital Signs    Vitals:   08/09/21 0000 08/09/21 0353 08/09/21 0804 08/09/21 1216  BP: 115/68 (!) 117/59 112/70 100/62  Pulse:   63 64  Resp: (!) 21 16 20 20   Temp:  98.1 F (36.7 C) 98 F (36.7 C) 98.8 F (37.1 C)  TempSrc:  Oral Oral Oral  SpO2:  96% 97% 97%  Weight:        Intake/Output Summary (Last 24 hours) at 08/09/2021 1447 Last data filed at 08/09/2021 1236 Gross per 24 hour  Intake 914.81 ml  Output 2125 ml  Net -1210.19 ml   Last 3 Weights 08/07/2021 03/15/2021 03/01/2021  Weight (lbs) 230 lb 239 lb 243 lb  Weight (kg) 104.327 kg 108.41 kg 110.224 kg      Telemetry    Sinus rhythm, PACs- Personally Reviewed  ECG     - Personally Reviewed  Physical Exam   GEN: No acute distress.   Neck: Difficult to assess due to body habitus. Cardiac: RRR, no murmurs, rubs, or gallops.  Respiratory: Diminished breath sounds at bases GI: Soft, nontender, non-distended  MS: No edema; No deformity. Neuro:  Nonfocal  Psych: Normal affect   Labs    High Sensitivity Troponin:   Recent Labs  Lab 08/07/21 1122 08/07/21 1324 08/08/21 0133  08/08/21 0700  TROPONINIHS 324* 1,725* >24,000* >24,000*      Chemistry Recent Labs  Lab 08/07/21 1122 08/08/21 0700 08/09/21 0652  NA 140 141 140  K 4.6 4.6 3.8  CL 107 104 104  CO2 25 25 26   GLUCOSE 196* 113* 94  BUN 50* 50* 55*  CREATININE 1.78* 1.93* 1.87*  CALCIUM 8.9 8.7* 8.5*  PROT 6.7  --   --   ALBUMIN 3.7  --   --   AST 48*  --   --   ALT 48*  --   --   ALKPHOS 46  --   --   BILITOT 0.9  --   --   GFRNONAA 38* 34* 36*  ANIONGAP 8 12 10      Hematology Recent Labs  Lab 08/07/21 1122 08/08/21 0700 08/09/21 0652  WBC 13.3* 14.1* 11.7*  RBC 3.84* 4.28 4.16*  HGB 12.5* 13.5 13.8  HCT 37.9* 42.2 40.1  MCV 98.7 98.6 96.4  MCH 32.6 31.5 33.2  MCHC 33.0 32.0 34.4  RDW 13.7 14.0 14.2  PLT 212 196 167    BNPNo results for input(s): BNP, PROBNP in the last 168 hours.   DDimer No results for input(s): DDIMER in the last 168  hours.   Radiology    CARDIAC CATHETERIZATION  Result Date: 08/08/2021 Conclusions: Severe native coronary artery disease, as detailed below, which is similar to prior catheterization from 2020. Widely patent LIMA-LAD. Patent sequential SVG-RPDA-distal LCx, though the RPDA/RCA appears to be occluded at the side-side anastomosis, new from 2020. Known to be occluded SVG-OM, not engaged on today's study. Severely elevated left ventricular filling pressure (LVEDP 35 mmHg with significant respiratory variation). Recommendations: Restart IV heparin 8 hours after sheath removal.  Transition to apixaban as soon as tomorrow if no evidence of bleeding/vascular complication. Initiate diuresis with furosemide 80 mg IV twice daily with close monitoring of renal function. Optimize antianginal and evidence-based heart failure therapy. Aggressive secondary prevention of coronary artery disease. Nelva Bush, MD [facility]  ECHOCARDIOGRAM COMPLETE  Result Date: 08/08/2021    ECHOCARDIOGRAM REPORT   Patient Name:   Jeff Henderson Date of Exam:  08/08/2021 Medical Rec #:  [id number]              Height:       69.0 in Accession #:    [id number]             Weight:       230.0 lb Date of Birth:  12-02-1941               BSA:          2.192 m Patient Age:    80 years               BP:           116/71 mmHg Patient Gender: M                      HR:           56 bpm. Exam Location:  [facility] Procedure: 2D Echo, Color Doppler, Cardiac Doppler and Intracardiac            Opacification Agent Indications:     R07.9 Chest Pain; I21.4 NSTEMI  History:         Patient has prior history of Echocardiogram examinations, most                  recent 12/24/2019. HFrEF, Prior CABG; Risk Factors:Hypertension                  and Dyslipidemia.  Sonographer:     Charmayne Sheer Referring Phys:  [id number] Robin Searing VISSER Diagnosing Phys: Nelva Bush MD  Sonographer Comments: Technically challenging study due to limited acoustic windows. Image acquisition challenging due to patient body habitus and Image acquisition challenging due to respiratory motion. IMPRESSIONS  1. Left ventricular ejection fraction, by estimation, is 30 to 35%. The left ventricle has moderately decreased function. Left ventricular endocardial border not optimally defined to evaluate regional wall motion. The left ventricular internal cavity size was severely dilated. There is mild left ventricular hypertrophy. Left ventricular diastolic parameters are indeterminate.  2. Right ventricular systolic function was not well visualized. The right ventricular size is not well visualized.  3. Left atrial size was moderately dilated.  4. The mitral valve was not well visualized. Mild to moderate mitral valve regurgitation. No evidence of mitral stenosis.  5. The aortic valve was not well visualized. Aortic valve regurgitation is not visualized. No aortic stenosis is present. FINDINGS  Left Ventricle: Left ventricular ejection fraction, by estimation, is 30 to 35%. The left ventricle has moderately decreased function. Left  ventricular  endocardial border not optimally defined to evaluate regional wall motion. Definity contrast agent was given IV to delineate the left ventricular endocardial borders. The left ventricular internal cavity size was severely dilated. There is mild left ventricular hypertrophy. Left ventricular diastolic parameters are indeterminate. Right Ventricle: The right ventricular size is not well visualized. Right vetricular wall thickness was not well visualized. Right ventricular systolic function was not well visualized. Left Atrium: Left atrial size was moderately dilated. Right Atrium: Right atrial size was not well visualized. Pericardium: There is no evidence of pericardial effusion. Mitral Valve: The mitral valve was not well visualized. Mild mitral annular calcification. Mild to moderate mitral valve regurgitation. No evidence of mitral valve stenosis. MV peak gradient, 1.6 mmHg. The mean mitral valve gradient is 1.0 mmHg. Tricuspid Valve: The tricuspid valve is not well visualized. Tricuspid valve regurgitation is not demonstrated. Aortic Valve: The aortic valve was not well visualized. Aortic valve regurgitation is not visualized. No aortic stenosis is present. Aortic valve mean gradient measures 2.0 mmHg. Aortic valve peak gradient measures 3.7 mmHg. Aortic valve area, by VTI measures 2.88 cm. Pulmonic Valve: The pulmonic valve was not well visualized. Aorta: The aortic root is normal in size and structure. IAS/Shunts: The interatrial septum was not well visualized.  LEFT VENTRICLE PLAX 2D LVIDd:         7.70 cm  Diastology LVIDs:         6.80 cm  LV e' medial:    3.37 cm/s LV PW:         1.20 cm  LV E/e' medial:  16.4 LV IVS:        1.30 cm  LV e' lateral:   6.85 cm/s LVOT diam:     2.50 cm  LV E/e' lateral: 8.1 LV SV:         60 LV SV Index:   28 LVOT Area:     4.91 cm  LEFT ATRIUM            Index LA Vol (A2C): 107.0 ml 48.80 ml/m  AORTIC VALVE AV Area (Vmax):    3.10 cm AV Area (Vmean):   3.33  cm AV Area (VTI):     2.88 cm AV Vmax:           96.10 cm/s AV Vmean:          67.600 cm/s AV VTI:            0.210 m AV Peak Grad:      3.7 mmHg AV Mean Grad:      2.0 mmHg LVOT Vmax:         60.70 cm/s LVOT Vmean:        45.800 cm/s LVOT VTI:          0.123 m LVOT/AV VTI ratio: 0.59  AORTA Ao Root diam: 3.00 cm MITRAL VALVE MV Area (PHT): 2.44 cm    SHUNTS MV Area VTI:   2.31 cm    Systemic VTI:  0.12 m MV Peak grad:  1.6 mmHg    Systemic Diam: 2.50 cm MV Mean grad:  1.0 mmHg MV Vmax:       0.64 m/s MV Vmean:      39.2 cm/s MV Decel Time: 311 msec MV E velocity: 55.30 cm/s MV A velocity: 67.70 cm/s MV E/A ratio:  0.82 Harrell Gave End MD Electronically signed by Nelva Bush MD Signature Date/Time: 08/08/2021/12:06:49 PM    Final     Cardiac Studies   TTE 08/08/2021  1. Left ventricular ejection fraction, by estimation, is 30 to 35%. The  left ventricle has moderately decreased function. Left ventricular  endocardial border not optimally defined to evaluate regional wall motion.  The left ventricular internal cavity  size was severely dilated. There is mild left ventricular hypertrophy.  Left ventricular diastolic parameters are indeterminate.   2. Right ventricular systolic function was not well visualized. The right  ventricular size is not well visualized.   3. Left atrial size was moderately dilated.   4. The mitral valve was not well visualized. Mild to moderate mitral  valve regurgitation. No evidence of mitral stenosis.   5. The aortic valve was not well visualized. Aortic valve regurgitation  is not visualized. No aortic stenosis is present.   LHC 08/08/2021 Conclusions: Severe native coronary artery disease, as detailed below, which is similar to prior catheterization from 2020. Widely patent LIMA-LAD. Patent sequential SVG-RPDA-distal LCx, though the RPDA/RCA appears to be occluded at the side-side anastomosis, new from 2020. Known to be occluded SVG-OM, not engaged on today's  study. Severely elevated left ventricular filling pressure (LVEDP 35 mmHg with significant respiratory variation).   Recommendations: Restart IV heparin 8 hours after sheath removal.  Transition to apixaban as soon as tomorrow if no evidence of bleeding/vascular complication. Initiate diuresis with furosemide 80 mg IV twice daily with close monitoring of renal function. Optimize antianginal and evidence-based heart failure therapy. Aggressive secondary prevention of coronary artery disease.    Patient Profile     80 y.o. male CAD/CABG, paroxysmal atrial fibrillation on Eliquis, CKD 3, hypertension, hyperlipidemia, ischemic cardiomyopathy presenting with chest pain and diagnosed with NSTEMI and A. fib RVR.  Significant distal RCA disease noted, not amendable to intervention.  Medical management advised.  Maintaining sinus rhythm on amiodarone.  Assessment & Plan    Chest pain, NSTEMI. Hx of cad/cagb -Continue heparin drip today to complete a 48-hour course. -Left heart cath with distal RCA/PDA disease not amenable to intervention -Medical therapy with aspirin, Lipitor, Coreg, Zetia.  2.  Ischemic cardiomyopathy.  EF 30 to 35% -Continue Coreg -restart PTA benazepril as BP permits.  BP is currently low. -Continue IV Lasix, creatinine stable.  Net -1.1 L so far today. -Anticipate switching to oral Lasix in a day or 2.  Plan dispo after switch to oral Lasix.  3.  Paroxysmal atrial fibrillation -Maintaining sinus rhythm on amiodarone -Continue p.o. amiodarone   Total encounter time 35 minutes  Greater than 50% was spent in counseling and coordination of care with the patient  Signed, Kate Sable, MD  08/09/2021, 2:47 PM

## 2021-08-09 NOTE — Consult Note (Signed)
ANTICOAGULATION CONSULT NOTE  Pharmacy Consult for Heparin drip Indication: chest pain/ACS  Patient Measurements: Heparin Dosing Weight: 93kg  Labs: Recent Labs    08/07/21 1122 08/07/21 1122 08/07/21 1324 08/08/21 0133 08/08/21 0700 08/08/21 1330 08/09/21 0652 08/09/21 1015 08/09/21 1740  HGB 12.5*  --   --   --  13.5  --  13.8  --   --   HCT 37.9*  --   --   --  42.2  --  40.1  --   --   PLT 212  --   --   --  196  --  167  --   --   APTT 29  --   --  >160*  --  >160*  --  56*  --   LABPROT 14.1  --   --   --   --   --   --   --   --   INR 1.1  --   --   --   --   --   --   --   --   HEPARINUNFRC  --    < >  --  >1.10* >1.10*  --   --  0.58 0.57  CREATININE 1.78*  --   --   --  1.93*  --  1.87*  --   --   TROPONINIHS 324*  --  1,725* >24,000* >24,000*  --   --   --   --    < > = values in this interval not displayed.     Estimated Creatinine Clearance: 38.7 mL/min (A) (by C-G formula based on SCr of 1.87 mg/dL (H)).   Medical History: Past Medical History:  Diagnosis Date   Arthritis    BPH (benign prostatic hyperplasia)    CAD (coronary artery disease)    a. 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 06/2018 Cath: Native 3VD, 2/3 patent grafts (VG->OM2 100)->Med rx; c. 05/2019 NSTEMI/Cath:  LM nl, LAD 99ost, 126m LCX 1072mRCA 100p, LIMA->LAD ok, VG->RPDA ok (? jump to OM), VG->OM not visualized (prev occluded). LVEDP 3061m-->Med Rx.   CKD (chronic kidney disease), stage III (HCC)    Gout    Gouty arthropathy, chronic, with tophi    Hernia, abdominal    HFrEF (heart failure with reduced ejection fraction) [place]  a. 06/2018 Echo: EF 45-50%, mild MR; b. 05/2019 Echo: EF 30-35%, diff HK.   Hyperlipidemia LDL goal <70    Hypertension    Ischemic cardiomyopathy    a. 06/2018 Echo: EF 45-50%; b. 05/2019 Echo: EF 30-35%, diff HK.   MGUS (monoclonal gammopathy of unknown significance)    Myocardial infarction (HCC)    PAF (paroxysmal atrial fibrillation)  ([place]  a. Dx 05/2019-->CHA2DS2VASc = 5-->Eliquis.   Renal cyst     Medications:  Medications Prior to Admission  Medication Sig Dispense Refill Last Dose   acetaminophen (TYLENOL) 500 MG tablet Take 500 mg by mouth every 6 (six) hours as needed for moderate pain, headache or mild pain.   PRN at PRN   amLODipine-benazepril (LOTREL) 5-20 MG capsule Take 1 capsule by mouth daily.   08/07/2021   atorvastatin (LIPITOR) 80 MG tablet Take 80 mg by mouth daily.    08/07/2021   carvedilol (COREG) 25 MG tablet Take 1 tablet (25 mg total) by mouth 2 (two) times daily with a meal. 180 tablet 3 08/07/2021   Coenzyme Q10 (COQ10) 100 MG CAPS Take 100 mg  by mouth daily.   08/07/2021   ELIQUIS 2.5 MG TABS tablet TAKE 1 TABLET(2.5 MG) BY MOUTH TWICE DAILY 180 tablet 1 08/07/2021 at AM   ezetimibe (ZETIA) 10 MG tablet TAKE 1 TABLET(10 MG) BY MOUTH DAILY 90 tablet 1 08/07/2021   famotidine (PEPCID) 20 MG tablet Take 20 mg by mouth 2 (two) times daily.   08/07/2021   furosemide (LASIX) 20 MG tablet TAKE 1 TABLET(20 MG) BY MOUTH DAILY 90 tablet 3 08/07/2021   isosorbide mononitrate (IMDUR) 60 MG 24 hr tablet TAKE 1 TABLET(60 MG) BY MOUTH DAILY 90 tablet 0 08/07/2021   Multiple Vitamins-Minerals (MENS 50+ MULTI VITAMIN/MIN) TABS Take 1 tablet by mouth daily.    08/07/2021   Omega-3 1000 MG CAPS Take 1,000 mg by mouth daily.    08/07/2021   predniSONE (STERAPRED UNI-PAK 21 TAB) 10 MG (21) TBPK tablet See admin instructions. follow package directions   08/07/2021 at AM   nitroGLYCERIN (NITROSTAT) 0.4 MG SL tablet Place 1 tablet (0.4 mg total) under the tongue every 5 (five) minutes as needed for chest pain (Nitro if taking third dose, call 911). 25 tablet 3 PRN at PRN    Assessment: Patient with PMH relevant CAD s/p CABG, HF, Afib (on eliquis), CKD3,subclavian stenosis and hyperlipidemia. Patient presents to ED with chest pain. Pharmacy consulted to manage heparin gtt for ACS.  Heparin Dosing Weight: 93kg  Goal of Therapy:  Heparin  level 0.3-0.7 units/ml aPTT 66-102 seconds Monitor platelets by anticoagulation protocol: Yes  Date Time aPTT/HL Rate/Comment 0906 0133 aPTT >160  supratherapeutic; 1250>950 un/hr 0906  1330   aPTT >160  supratherapeutic; 950>700 un/hr 0906  1600     -- / --  Heparin stopped for cath procedure.    2957 0203     -- / --  Heparin restarted 8h post sheath removal. 0907 1015 56s / 0.58 Thera x1; seeing correlation trend anti-Xa's 0907 1740          0.57 Thera x2  Baseline Labs: aPTT - 29s INR - 1.1 Hgb - 12.5>13.5>13.8 Plts - [phone]   Plan: Heparin level therapeutic X2 (0.57; goal 0.3-0.7) --Continue heparin at 700 units/hr  --Monitor daily heparin level, CBC, s/s of bleed'  Darnelle Bos, PharmD Clinical Pharmacist   08/09/2021 7:15 PM

## 2021-08-09 NOTE — Consult Note (Signed)
ANTICOAGULATION CONSULT NOTE  Pharmacy Consult for Heparin drip Indication: chest pain/ACS  Patient Measurements: Heparin Dosing Weight: 93kg  Labs: Recent Labs    08/07/21 1122 08/07/21 1324 08/08/21 0133 08/08/21 0700 08/08/21 1330 08/09/21 0652 08/09/21 1015  HGB 12.5*  --   --  13.5  --  13.8  --   HCT 37.9*  --   --  42.2  --  40.1  --   PLT 212  --   --  196  --  167  --   APTT 29  --  >160*  --  >160*  --  56*  LABPROT 14.1  --   --   --   --   --   --   INR 1.1  --   --   --   --   --   --   HEPARINUNFRC  --   --  >1.10* >1.10*  --   --  0.58  CREATININE 1.78*  --   --  1.93*  --  1.87*  --   TROPONINIHS 324* 1,725* >24,000* >24,000*  --   --   --      Estimated Creatinine Clearance: 36.9 mL/min (A) (by C-G formula based on SCr of 1.87 mg/dL (H)).   Medical History: Past Medical History:  Diagnosis Date   Arthritis    BPH (benign prostatic hyperplasia)    CAD (coronary artery disease)    a. 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 06/2018 Cath: Native 3VD, 2/3 patent grafts (VG->OM2 100)->Med rx; c. 05/2019 NSTEMI/Cath:  LM nl, LAD 99ost, 13m LCX 1060mRCA 100p, LIMA->LAD ok, VG->RPDA ok (? jump to OM), VG->OM not visualized (prev occluded). LVEDP 3086m-->Med Rx.   CKD (chronic kidney disease), stage III (HCC)    Gout    Gouty arthropathy, chronic, with tophi    Hernia, abdominal    HFrEF (heart failure with reduced ejection fraction) [place]  a. 06/2018 Echo: EF 45-50%, mild MR; b. 05/2019 Echo: EF 30-35%, diff HK.   Hyperlipidemia LDL goal <70    Hypertension    Ischemic cardiomyopathy    a. 06/2018 Echo: EF 45-50%; b. 05/2019 Echo: EF 30-35%, diff HK.   MGUS (monoclonal gammopathy of unknown significance)    Myocardial infarction (HCC)    [place]F ([place]oxysmal [place] fibrillation) [place]  a. Dx 05/2019-->CHA2DS2VASc = 5-->Eliquis.   Renal cyst     Medications:  Medications Prior to Admission  Medication Sig Dispense Refill Last Dose    acetaminophen (TYLENOL) 500 MG tablet Take 500 mg by mouth every 6 (six) hours as needed for moderate pain, headache or mild pain.   PRN at PRN   amLODipine-benazepril (LOTREL) 5-20 MG capsule Take 1 capsule by mouth daily.   08/07/2021   atorvastatin (LIPITOR) 80 MG tablet Take 80 mg by mouth daily.    08/07/2021   carvedilol (COREG) 25 MG tablet Take 1 tablet (25 mg total) by mouth 2 (two) times daily with a meal. 180 tablet 3 08/07/2021   Coenzyme Q10 (COQ10) 100 MG CAPS Take 100 mg by mouth daily.   08/07/2021   ELIQUIS 2.5 MG TABS tablet TAKE 1 TABLET(2.5 MG) BY MOUTH TWICE DAILY 180 tablet 1 08/07/2021 at AM   ezetimibe (ZETIA) 10 MG tablet TAKE 1 TABLET(10 MG) BY MOUTH DAILY 90 tablet 1 08/07/2021   famotidine (PEPCID) 20 MG tablet Take 20 mg by mouth 2 (two) times daily.   08/07/2021   furosemide (LASIX)  20 MG tablet TAKE 1 TABLET(20 MG) BY MOUTH DAILY 90 tablet 3 08/07/2021   isosorbide mononitrate (IMDUR) 60 MG 24 hr tablet TAKE 1 TABLET(60 MG) BY MOUTH DAILY 90 tablet 0 08/07/2021   Multiple Vitamins-Minerals (MENS 50+ MULTI VITAMIN/MIN) TABS Take 1 tablet by mouth daily.    08/07/2021   Omega-3 1000 MG CAPS Take 1,000 mg by mouth daily.    08/07/2021   predniSONE (STERAPRED UNI-PAK 21 TAB) 10 MG (21) TBPK tablet See admin instructions. follow package directions   08/07/2021 at AM   nitroGLYCERIN (NITROSTAT) 0.4 MG SL tablet Place 1 tablet (0.4 mg total) under the tongue every 5 (five) minutes as needed for chest pain (Nitro if taking third dose, call 911). 25 tablet 3 PRN at PRN    Assessment: Patient with PMH relevant CAD s/p CABG, HF, Afib (on eliquis), CKD3,subclavian stenosis and hyperlipidemia. Patient presents to ED with chest pain. Pharmacy consulted to manage heparin gtt for ACS.  Heparin Dosing Weight: 93kg  Goal of Therapy:  Heparin level 0.3-0.7 units/ml aPTT 66-102 seconds Monitor platelets by anticoagulation protocol: Yes  Date Time aPTT/HL Rate/Comment 0906 0133 aPTT >160   supratherapeutic; 1250>950 un/hr 0906  1330   aPTT >160  supratherapeutic; 950>700 un/hr 0906  1600     -- / --  Heparin stopped for cath procedure.    2703 0203     -- / --  Heparin restarted 8h post sheath removal. 0907 1015 56s / 0.58 Thera x1; seeing correlation trend anti-Xa's  Baseline Labs: aPTT - 29s INR - 1.1 Hgb - 12.5>13.5>13.8 Plts - [phone]   Plan: Heparin level therapeutic X1 (0.58; goal 0.3-0.7); seeing correlation b/n aPTT/Ant-Xa labs. Trend anti-Xa's alone moving forward. --Continue heparin at 700 units/hr  --Recheck HL in 8 hours to confirm rate; then daily if consecutively therapeutic. --CBC daily while on heparin.  Lorna Dibble, PharmD, [facility] Clinical Pharmacist   08/09/2021 12:23 PM

## 2021-08-10 DIAGNOSIS — I25118 Atherosclerotic heart disease of native coronary artery with other forms of angina pectoris: Secondary | ICD-10-CM

## 2021-08-10 DIAGNOSIS — I255 Ischemic cardiomyopathy: Secondary | ICD-10-CM

## 2021-08-10 DIAGNOSIS — N1832 Chronic kidney disease, stage 3b: Secondary | ICD-10-CM | POA: Diagnosis not present

## 2021-08-10 DIAGNOSIS — I4891 Unspecified atrial fibrillation: Secondary | ICD-10-CM

## 2021-08-10 DIAGNOSIS — I214 Non-ST elevation (NSTEMI) myocardial infarction: Principal | ICD-10-CM

## 2021-08-10 DIAGNOSIS — I482 Chronic atrial fibrillation, unspecified: Secondary | ICD-10-CM

## 2021-08-10 DIAGNOSIS — I472 Ventricular tachycardia: Secondary | ICD-10-CM

## 2021-08-10 DIAGNOSIS — U071 COVID-19: Secondary | ICD-10-CM

## 2021-08-10 DIAGNOSIS — E785 Hyperlipidemia, unspecified: Secondary | ICD-10-CM

## 2021-08-10 LAB — BASIC METABOLIC PANEL
Anion gap: 9 (ref 5–15)
BUN: 63 mg/dL — ABNORMAL HIGH (ref 8–23)
CO2: 27 mmol/L (ref 22–32)
Calcium: 8.3 mg/dL — ABNORMAL LOW (ref 8.9–10.3)
Chloride: 102 mmol/L (ref 98–111)
Creatinine, Ser: 2.19 mg/dL — ABNORMAL HIGH (ref 0.61–1.24)
GFR, Estimated: 30 mL/min — ABNORMAL LOW (ref >60)
Glucose, Bld: 100 mg/dL — ABNORMAL HIGH (ref 70–99)
Potassium: 3.5 mmol/L (ref 3.5–5.1)
Sodium: 138 mmol/L (ref 135–145)

## 2021-08-10 LAB — CBC
HCT: 39.1 % (ref 39.0–52.0)
Hemoglobin: 13.5 g/dL (ref 13.0–17.0)
MCH: 32.8 pg (ref 26.0–34.0)
MCHC: 34.5 g/dL (ref 30.0–36.0)
MCV: 95.1 fL (ref 80.0–100.0)
Platelets: 149 10*3/uL — ABNORMAL LOW (ref 150–400)
RBC: 4.11 MIL/uL — ABNORMAL LOW (ref 4.22–5.81)
RDW: 13.8 % (ref 11.5–15.5)
WBC: 9.5 10*3/uL (ref 4.0–10.5)
nRBC: 0 % (ref 0.0–0.2)

## 2021-08-10 LAB — HEPARIN LEVEL (UNFRACTIONATED): Heparin Unfractionated: 0.5 IU/mL (ref 0.30–0.70)

## 2021-08-10 LAB — C-REACTIVE PROTEIN: CRP: 15 mg/dL — ABNORMAL HIGH (ref <1.0)

## 2021-08-10 MED ORDER — APIXABAN 2.5 MG PO TABS: 2.5000 mg | ORAL_TABLET | Freq: Two times a day (BID) | ORAL | 0 refills | Status: DC

## 2021-08-10 MED ORDER — AMIODARONE HCL 400 MG PO TABS: 400.0000 mg | ORAL_TABLET | Freq: Two times a day (BID) | ORAL | 0 refills | Status: AC

## 2021-08-10 MED ORDER — ASPIRIN 81 MG PO CHEW: 81.0000 mg | CHEWABLE_TABLET | Freq: Every day | ORAL | 0 refills | Status: AC

## 2021-08-10 MED ORDER — CARVEDILOL 3.125 MG PO TABS: 3.1250 mg | ORAL_TABLET | Freq: Two times a day (BID) | ORAL | 0 refills | Status: AC

## 2021-08-10 MED ORDER — AMLODIPINE BESY-BENAZEPRIL HCL 5-20 MG PO CAPS: 1.0000 | ORAL_CAPSULE | Freq: Every day | ORAL | 11 refills | Status: DC

## 2021-08-10 NOTE — Discharge Summary (Signed)
Physician Discharge Summary  Jeff Henderson HOZ:[id number] DOB: 21-Oct-1941 DOA: 08/07/2021  PCP: [facility]  Admit date: 08/07/2021 Discharge date: 08/10/2021  Admitted From: home  Disposition:  home   Recommendations for Outpatient Follow-up:  Follow up with PCP in 1-2 weeks F/u w/ cardio, Dr. Fletcher Anon, in 1 week Needs to BMP to check Cr level within 1 week  Home Health: no  Equipment/Devices:  Discharge Condition: stable  CODE STATUS: full  Diet recommendation: Heart Healthy   Brief/Interim Summary: HPI was taken from Dr. Flossie Buffy: Jeff Henderson is a 80 y.o. male with medical history significant for remote history CAD s/p CABG,HFrEF, atrial fibrillation on Eliquis, CKD stage III, hyperlipidemia who presents with concerns of chest pain.  Patient normally walks on his treadmill at home about 3 times weekly.  Today he was on the treadmill about 20 to 30 minutes and then around 9:30 AM he began to feel his heart racing and left-sided chest pain about a 6 out of 10.  He tried to sit and rest without relief so drove himself to the fire station for EMS.  He denies any shortness of breath, or coughing.  No nausea, vomiting or diarrhea.   In the ER, he presented in atrial fibrillation with RVR with rates up to 150s to 170s with blood pressure of 112/96.  He was given IV 20 mg diltiazem bolus and had transient hypotension down to 80 over 50s that resolved spontaneously.  He was then noted to have elevated heart rate up to 170 briefly and had EKG findings of ventricular tachycardia.  Troponin resulted at 320.  He was placed on IV amiodarone bolus and infusion.  Also started on IV heparin for NSTEMI.  Initially there was plans to take patient to Cath Lab emergently but cardiology evaluated patient at the bedside and given improvement of his chest pain they have plan for left heart cath tomorrow morning.   Hospitalist on-call for admission.    As per Dr. Loleta Books: Jeff Henderson is a  80 y.o. 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   Patient had just worked out on treadmill and eaten a snack when he developed substernal CP, for about 45 minutes.  This waxed and waned and finally he came to the ER.   In the ER, Cr at baseline, HsTrop 300s and in Afib with RVR, brief episode VT, then back to Afib.   Started on amiodarone and heparin infusion and Cardiology consulted.  Incidentally noted to be COVID+.   Hospital course from Dr. Jimmye Norman 9/7-08/10/21: Pt had undergone a cardiac cath on 9/6 which showed  possible new occlusion of RCA and lesion not amenable to PCI. Pt was on IV heparin drip for 48 hours. ACE-I was held at d/c secondary to worsening Cr and will likely be restarted as outpatient. Of note, pt was walking and transferring independently so PT/OT did not evaluate the pt. For more information, please see previous progress/consult notes.   Discharge Diagnoses:  Principal Problem:   NSTEMI (non-ST elevated myocardial infarction) ([place]) Active Problems:   CKD (chronic kidney disease), stage III (HCC)   Hyperlipidemia LDL goal <70   Atrial fibrillation with RVR (HCC)   Non-sustained ventricular tachycardia (HCC)   Hypotension   Acute on chronic HFrEF (heart failure with reduced ejection fraction) ([place])   COVID-19 virus infection  NSTEMI: s/p cardiac cath on 08/08/21 which showed possible new occlusion of RCA and lesion not amenable to PCI. D/c IV heparin drip. Continue on aspirin, statin, coreg, zetia & restarted on eliquis as per cardio. Continue on tele   Chronic atrial fibrillation: continue on carvedilol, amiodarone and restarted eliquis    COVID19 infection: w/ mild cough. Paxlovid would reduce efficacy of Plavix, will avoid in the circumstances.Continue on IV remdesivir   Chronic systolic CHF: continue on coreg. Continue to  hold ECE-I, imdur lasix    CKDIIIb: Cr is labile. Avoid nephrotoxic meds   MGUS: continue w/ supportive care    Obesity: BMI 33.9. Complicates overall care and prognosis    Discharge Instructions  Discharge Instructions     Diet - low sodium heart healthy   Complete by: As directed    Discharge instructions   Complete by: As directed    F/u w/ cardio, Dr. Fletcher Anon, in 1 week. F/u w/ PCP in 1-2 weeks. Need to get lab work, BMP, to check Cr within 1 week   Increase activity slowly   Complete by: As directed       Allergies as of 08/10/2021       Reactions   Penicillins Rash, Other (See Comments)   Has patient had a PCN reaction causing immediate rash, facial/tongue/throat swelling, SOB or lightheadedness with hypotension: No Has patient had a PCN reaction causing severe rash involving mucus membranes or skin necrosis: No Has patient had a PCN reaction that required hospitalization: No Has patient had a PCN reaction occurring within the last 10 years: No If all of the above answers are "NO", then may proceed with Cephalosporin use.        Medication List     STOP taking these medications    predniSONE 10 MG (21) Tbpk tablet Commonly known as: STERAPRED UNI-PAK 21 TAB       TAKE these medications    acetaminophen 500 MG tablet Commonly known as: TYLENOL Take 500 mg by mouth every 6 (six) hours as needed for moderate pain, headache or mild pain.   amiodarone 400 MG tablet Commonly known as: PACERONE Take 1 tablet (400 mg total) by mouth 2 (two) times daily.   amLODipine-benazepril 5-20 MG capsule Commonly known as: LOTREL Take 1 capsule by mouth daily. HOLD THIS MEDICATION UNTIL YOU SEE YOUR CARDIOLOGIST AS AN OUTPATIENT What changed: additional instructions   apixaban 2.5 MG Tabs tablet Commonly known as: Eliquis Take 1 tablet (2.5 mg total) by mouth 2 (two) times daily. What changed: See the new instructions.   aspirin 81 MG chewable tablet Chew 1 tablet  (81 mg total) by mouth daily. Start taking on: August 11, 2021   atorvastatin 80 MG tablet Commonly known as: LIPITOR Take 80 mg by mouth daily.   carvedilol 3.125 MG tablet Commonly known as: COREG Take 1 tablet (3.125 mg total) by mouth 2 (two) times daily with a meal. What changed:  medication strength how much to take   CoQ10 100 MG Caps Take 100 mg by mouth daily.   ezetimibe 10 MG tablet Commonly known as: ZETIA TAKE 1 TABLET(10 MG) BY MOUTH DAILY   famotidine 20 MG tablet Commonly known as: PEPCID Take 20 mg by mouth 2 (two) times daily.   furosemide 20 MG tablet Commonly known as: LASIX TAKE 1 TABLET(20 MG) BY MOUTH DAILY   isosorbide mononitrate 60 MG 24 hr tablet Commonly known as: IMDUR TAKE 1 TABLET(60 MG) BY MOUTH DAILY  Mens 50+ Multi Vitamin/Min Tabs Take 1 tablet by mouth daily.   nitroGLYCERIN 0.4 MG SL tablet Commonly known as: Nitrostat Place 1 tablet (0.4 mg total) under the tongue every 5 (five) minutes as needed for chest pain (Nitro if taking third dose, call 911).   Omega-3 1000 MG Caps Take 1,000 mg by mouth daily.        Allergies  Allergen Reactions   Penicillins Rash and Other (See Comments)    Has patient had a PCN reaction causing immediate rash, facial/tongue/throat swelling, SOB or lightheadedness with hypotension: No Has patient had a PCN reaction causing severe rash involving mucus membranes or skin necrosis: No Has patient had a PCN reaction that required hospitalization: No Has patient had a PCN reaction occurring within the last 10 years: No If all of the above answers are "NO", then may proceed with Cephalosporin use.     Consultations: Cardio    Procedures/Studies: CARDIAC CATHETERIZATION  Result Date: 08/08/2021 Conclusions: Severe native coronary artery disease, as detailed below, which is similar to prior catheterization from 2020. Widely patent LIMA-LAD. Patent sequential SVG-RPDA-distal LCx, though the  RPDA/RCA appears to be occluded at the side-side anastomosis, new from 2020. Known to be occluded SVG-OM, not engaged on today's study. Severely elevated left ventricular filling pressure (LVEDP 35 mmHg with significant respiratory variation). Recommendations: Restart IV heparin 8 hours after sheath removal.  Transition to apixaban as soon as tomorrow if no evidence of bleeding/vascular complication. Initiate diuresis with furosemide 80 mg IV twice daily with close monitoring of renal function. Optimize antianginal and evidence-based heart failure therapy. Aggressive secondary prevention of coronary artery disease. Nelva Bush, MD [facility]  DG Chest Portable 1 View  Result Date: 08/07/2021 CLINICAL DATA:  Chest pain EXAM: PORTABLE CHEST 1 VIEW COMPARISON:  Chest radiograph dated 05/06/2019. FINDINGS: The heart is enlarged. Median sternotomy wires are redemonstrated. Vascular calcifications are seen in the aortic arch. The left costophrenic angle is obscured which may reflect a trace pleural effusion with associated atelectasis/airspace disease. The right lung is clear with no pleural effusion. There is no pneumothorax. Degenerative changes are seen in the spine. IMPRESSION: Possible trace left pleural effusion with associated atelectasis/airspace disease. Aortic Atherosclerosis (ICD10-I70.0). Electronically Signed   By: Zerita Boers M.D.   On: 08/07/2021 11:49   ECHOCARDIOGRAM COMPLETE  Result Date: 08/08/2021    ECHOCARDIOGRAM REPORT   Patient Name:   Jeff Henderson Date of Exam: 08/08/2021 Medical Rec #:  [id number]              Height:       69.0 in Accession #:    [id number]             Weight:       230.0 lb Date of Birth:  [birth year]               BSA:          2.192 m Patient Age:    31 years               BP:           116/71 mmHg Patient Gender: M                      HR:           56 bpm. Exam Location:  [facility] Procedure: 2D Echo, Color Doppler, Cardiac Doppler and Intracardiac  Opacification Agent Indications:     R07.9 Chest Pain; I21.4 NSTEMI  History:         Patient has prior history of Echocardiogram examinations, most                  recent 12/24/2019. HFrEF, Prior CABG; Risk Factors:Hypertension                  and Dyslipidemia.  Sonographer:     Charmayne Sheer Referring Phys:  [id number] Robin Searing VISSER Diagnosing Phys: Nelva Bush MD  Sonographer Comments: Technically challenging study due to limited acoustic windows. Image acquisition challenging due to patient body habitus and Image acquisition challenging due to respiratory motion. IMPRESSIONS  1. Left ventricular ejection fraction, by estimation, is 30 to 35%. The left ventricle has moderately decreased function. Left ventricular endocardial border not optimally defined to evaluate regional wall motion. The left ventricular internal cavity size was severely dilated. There is mild left ventricular hypertrophy. Left ventricular diastolic parameters are indeterminate.  2. Right ventricular systolic function was not well visualized. The right ventricular size is not well visualized.  3. Left atrial size was moderately dilated.  4. The mitral valve was not well visualized. Mild to moderate mitral valve regurgitation. No evidence of mitral stenosis.  5. The aortic valve was not well visualized. Aortic valve regurgitation is not visualized. No aortic stenosis is present. FINDINGS  Left Ventricle: Left ventricular ejection fraction, by estimation, is 30 to 35%. The left ventricle has moderately decreased function. Left ventricular endocardial border not optimally defined to evaluate regional wall motion. Definity contrast agent was given IV to delineate the left ventricular endocardial borders. The left ventricular internal cavity size was severely dilated. There is mild left ventricular hypertrophy. Left ventricular diastolic parameters are indeterminate. Right Ventricle: The right ventricular size is not well visualized. Right  vetricular wall thickness was not well visualized. Right ventricular systolic function was not well visualized. Left Atrium: Left atrial size was moderately dilated. Right Atrium: Right atrial size was not well visualized. Pericardium: There is no evidence of pericardial effusion. Mitral Valve: The mitral valve was not well visualized. Mild mitral annular calcification. Mild to moderate mitral valve regurgitation. No evidence of mitral valve stenosis. MV peak gradient, 1.6 mmHg. The mean mitral valve gradient is 1.0 mmHg. Tricuspid Valve: The tricuspid valve is not well visualized. Tricuspid valve regurgitation is not demonstrated. Aortic Valve: The aortic valve was not well visualized. Aortic valve regurgitation is not visualized. No aortic stenosis is present. Aortic valve mean gradient measures 2.0 mmHg. Aortic valve peak gradient measures 3.7 mmHg. Aortic valve area, by VTI measures 2.88 cm. Pulmonic Valve: The pulmonic valve was not well visualized. Aorta: The aortic root is normal in size and structure. IAS/Shunts: The interatrial septum was not well visualized.  LEFT VENTRICLE PLAX 2D LVIDd:         7.70 cm  Diastology LVIDs:         6.80 cm  LV e' medial:    3.37 cm/s LV PW:         1.20 cm  LV E/e' medial:  16.4 LV IVS:        1.30 cm  LV e' lateral:   6.85 cm/s LVOT diam:     2.50 cm  LV E/e' lateral: 8.1 LV SV:         60 LV SV Index:   28 LVOT Area:     4.91 cm  LEFT ATRIUM  Index LA Vol (A2C): 107.0 ml 48.80 ml/m  AORTIC VALVE AV Area (Vmax):    3.10 cm AV Area (Vmean):   3.33 cm AV Area (VTI):     2.88 cm AV Vmax:           96.10 cm/s AV Vmean:          67.600 cm/s AV VTI:            0.210 m AV Peak Grad:      3.7 mmHg AV Mean Grad:      2.0 mmHg LVOT Vmax:         60.70 cm/s LVOT Vmean:        45.800 cm/s LVOT VTI:          0.123 m LVOT/AV VTI ratio: 0.59  AORTA Ao Root diam: 3.00 cm MITRAL VALVE MV Area (PHT): 2.44 cm    SHUNTS MV Area VTI:   2.31 cm    Systemic VTI:  0.12 m MV Peak  grad:  1.6 mmHg    Systemic Diam: 2.50 cm MV Mean grad:  1.0 mmHg MV Vmax:       0.64 m/s MV Vmean:      39.2 cm/s MV Decel Time: 311 msec MV E velocity: 55.30 cm/s MV A velocity: 67.70 cm/s MV E/A ratio:  0.82 Jeff End MD Electronically signed by Nelva Bush MD Signature Date/Time: 08/08/2021/12:06:49 PM    Final    (Echo, Carotid, EGD, Colonoscopy, ERCP)    Subjective: pt c/o malaise   Discharge Exam: Vitals:   08/10/21 0500 08/10/21 1232  BP: (!) 141/64 119/68  Pulse: (!) 58 61  Resp: 20 18  Temp: 97.7 F (36.5 C) 97.7 F (36.5 C)  SpO2: 93% 96%   Vitals:   08/09/21 2015 08/10/21 0000 08/10/21 0500 08/10/21 1232  BP: (!) 107/50 105/72 (!) 141/64 119/68  Pulse: (!) 58 64 (!) 58 61  Resp: 19 20 20 18   Temp: 97.8 F (36.6 C) 98.2 F (36.8 C) 97.7 F (36.5 C) 97.7 F (36.5 C)  TempSrc: Oral Oral Oral   SpO2: 96% 93% 93% 96%  Weight:      Height:        General: Pt is alert, awake, not in acute distress. Obese Cardiovascular:  S1/S2 +, no rubs, no gallops Respiratory: CTA bilaterally, no wheezing, no rhonchi Abdominal: Soft, NT, obese, bowel sounds + Extremities: no cyanosis    The results of significant diagnostics from this hospitalization (including imaging, microbiology, ancillary and laboratory) are listed below for reference.     Microbiology: Recent Results (from the past 240 hour(s))  Resp Panel by RT-PCR (Flu A&B, Covid) Nasopharyngeal Swab     Status: Abnormal   Collection Time: 08/07/21 11:22 AM   Specimen: Nasopharyngeal Swab; Nasopharyngeal(NP) swabs in vial transport medium  Result Value Ref Range Status   SARS Coronavirus 2 by RT PCR POSITIVE (A) NEGATIVE Final    Comment: RESULT CALLED TO, READ BACK BY AND VERIFIED WITH: ANNA JASPER 08/07/21 1247 AMK (NOTE) SARS-CoV-2 target nucleic acids are DETECTED.  The SARS-CoV-2 RNA is generally detectable in upper respiratory specimens during the acute phase of infection. Positive results  are indicative of the presence of the identified virus, but do not rule out bacterial infection or co-infection with other pathogens not detected by the test. Clinical correlation with patient history and other diagnostic information is necessary to determine patient infection status. The expected result is Negative.  Fact Sheet for Patients: EntrepreneurPulse.com.au  Fact Sheet for Healthcare Providers: IncredibleEmployment.be  This test is not yet approved or cleared by the [place] FDA and  has been authorized for detection and/or diagnosis of SARS-CoV-2 by FDA under an Emergency Use Authorization (EUA).  This EUA will remain in effect (meaning this test can be used)  for the duration of  the COVID-19 declaration under Section 564(b)(1) of the Act, 21 U.S.C. section 360bbb-3(b)(1), unless the authorization is terminated or revoked sooner.     Influenza A by PCR NEGATIVE NEGATIVE Final   Influenza B by PCR NEGATIVE NEGATIVE Final    Comment: (NOTE) The Xpert Xpress SARS-CoV-2/FLU/RSV plus assay is intended as an aid in the diagnosis of influenza from Nasopharyngeal swab specimens and should not be used as a sole basis for treatment. Nasal washings and aspirates are unacceptable for Xpert Xpress SARS-CoV-2/FLU/RSV testing.  Fact Sheet for Patients: EntrepreneurPulse.com.au  Fact Sheet for Healthcare Providers: IncredibleEmployment.be  This test is not yet approved or cleared by the [place] FDA and has been authorized for detection and/or diagnosis of SARS-CoV-2 by FDA under an Emergency Use Authorization (EUA). This EUA will remain in effect (meaning this test can be used) for the duration of the COVID-19 declaration under Section 564(b)(1) of the Act, 21 U.S.C. section 360bbb-3(b)(1), unless the authorization is terminated or revoked.  Performed at [facility], [address] [place], [place] [address]      Labs: BNP (last 3 results) No results for input(s): BNP in the last 8760 hours. Basic Metabolic Panel: Recent Labs  Lab 08/07/21 1122 08/08/21 0700 08/09/21 0652 08/10/21 0558  NA 140 141 140 138  K 4.6 4.6 3.8 3.5  CL 107 104 104 102  CO2 25 25 26 27   GLUCOSE 196* 113* 94 100*  BUN 50* 50* 55* 63*  CREATININE 1.78* 1.93* 1.87* 2.19*  CALCIUM 8.9 8.7* 8.5* 8.3*  MG 2.1  --   --   --    Liver Function Tests: Recent Labs  Lab 08/07/21 1122  AST 48*  ALT 48*  ALKPHOS 46  BILITOT 0.9  PROT 6.7  ALBUMIN 3.7   No results for input(s): LIPASE, AMYLASE in the last 168 hours. No results for input(s): AMMONIA in the last 168 hours. CBC: Recent Labs  Lab 08/07/21 1122 08/08/21 0700 08/09/21 0652 08/10/21 0558  WBC 13.3* 14.1* 11.7* 9.5  NEUTROABS 11.6*  --   --   --   HGB 12.5* 13.5 13.8 13.5  HCT 37.9* 42.2 40.1 39.1  MCV 98.7 98.6 96.4 95.1  PLT 212 196 167 149*   Cardiac Enzymes: No results for input(s): CKTOTAL, CKMB, CKMBINDEX, TROPONINI in the last 168 hours. BNP: Invalid input(s): POCBNP CBG: No results for input(s): GLUCAP in the last 168 hours. D-Dimer No results for input(s): DDIMER in the last 72 hours. Hgb A1c No results for input(s): HGBA1C in the last 72 hours. Lipid Profile No results for input(s): CHOL, HDL, LDLCALC, TRIG, CHOLHDL, LDLDIRECT in the last 72 hours. Thyroid function studies Recent Labs    08/07/21 1326  TSH 0.584   Anemia work up No results for input(s): VITAMINB12, FOLATE, FERRITIN, TIBC, IRON, RETICCTPCT in the last 72 hours. Urinalysis No results found for: COLORURINE, APPEARANCEUR, Naplate, [place], GLUCOSEU, [place], [place], KETONESUR, PROTEINUR, UROBILINOGEN, NITRITE, LEUKOCYTESUR Sepsis Labs Invalid input(s): PROCALCITONIN,  WBC,  LACTICIDVEN Microbiology Recent Results (from the past 240 hour(s))  Resp Panel by RT-PCR (Flu A&B, Covid) Nasopharyngeal Swab     Status: Abnormal  Collection Time: 08/07/21 11:22 AM   Specimen: Nasopharyngeal Swab; Nasopharyngeal(NP) swabs in vial transport medium  Result Value Ref Range Status   SARS Coronavirus 2 by RT PCR POSITIVE (A) NEGATIVE Final    Comment: RESULT CALLED TO, READ BACK BY AND VERIFIED WITH: ANNA JASPER 08/07/21 1247 AMK (NOTE) SARS-CoV-2 target nucleic acids are DETECTED.  The SARS-CoV-2 RNA is generally detectable in upper respiratory specimens during the acute phase of infection. Positive results are indicative of the presence of the identified virus, but do not rule out bacterial infection or co-infection with other pathogens not detected by the test. Clinical correlation with patient history and other diagnostic information is necessary to determine patient infection status. The expected result is Negative.  Fact Sheet for Patients: EntrepreneurPulse.com.au  Fact Sheet for Healthcare Providers: IncredibleEmployment.be  This test is not yet approved or cleared by the [place] FDA and  has been authorized for detection and/or diagnosis of SARS-CoV-2 by FDA under an Emergency Use Authorization (EUA).  This EUA will remain in effect (meaning this test can be used)  for the duration of  the COVID-19 declaration under Section 564(b)(1) of the Act, 21 U.S.C. section 360bbb-3(b)(1), unless the authorization is terminated or revoked sooner.     Influenza A by PCR NEGATIVE NEGATIVE Final   Influenza B by PCR NEGATIVE NEGATIVE Final    Comment: (NOTE) The Xpert Xpress SARS-CoV-2/FLU/RSV plus assay is intended as an aid in the diagnosis of influenza from Nasopharyngeal swab specimens and should not be used as a sole basis for treatment. Nasal washings and aspirates are unacceptable for Xpert Xpress SARS-CoV-2/FLU/RSV testing.  Fact Sheet for Patients: EntrepreneurPulse.com.au  Fact Sheet for Healthcare  Providers: IncredibleEmployment.be  This test is not yet approved or cleared by the [place] FDA and has been authorized for detection and/or diagnosis of SARS-CoV-2 by FDA under an Emergency Use Authorization (EUA). This EUA will remain in effect (meaning this test can be used) for the duration of the COVID-19 declaration under Section 564(b)(1) of the Act, 21 U.S.C. section 360bbb-3(b)(1), unless the authorization is terminated or revoked.  Performed at [facility], [address] [place], [place] [address]      Time coordinating discharge: Over 30 minutes  SIGNED:   Wyvonnia Dusky, MD  [facility] 08/10/2021, 12:46 PM Pager   If 7PM-7AM, please contact night-coverage

## 2021-08-10 NOTE — Progress Notes (Signed)
Progress Note  Patient Name: Jeff Henderson Date of Encounter: 08/10/2021  [facility] Cardiologist: [name], MD   Subjective   Reports that he feels well, thinks he can go home Denies any chest pain or shortness of breath, has been walking in the room Reports his main complaint was atrial fibrillation which he developed after getting off his treadmill at home Reports having this periodically  Telemetry reviewed, maintaining normal sinus rhythm  Inpatient Medications    Scheduled Meds:  amiodarone  400 mg Oral BID   aspirin  81 mg Oral Daily   atorvastatin  80 mg Oral Daily   carvedilol  3.125 mg Oral BID WC   ezetimibe  10 mg Oral Daily   influenza vaccine adjuvanted  0.5 mL Intramuscular Tomorrow-1000   sodium chloride flush  3 mL Intravenous Q12H   sodium chloride flush  3 mL Intravenous Q12H   Continuous Infusions:  sodium chloride     heparin 700 Units/hr (08/10/21 0448)   PRN Meds: sodium chloride, acetaminophen, nitroGLYCERIN, ondansetron (ZOFRAN) IV, sodium chloride flush   Vital Signs    Vitals:   08/09/21 1845 08/09/21 2015 08/10/21 0000 08/10/21 0500  BP: 126/66 (!) 107/50 105/72 (!) 141/64  Pulse: 73 (!) 58 64 (!) 58  Resp: 20 19 20 20   Temp: 98.2 F (36.8 C) 97.8 F (36.6 C) 98.2 F (36.8 C) 97.7 F (36.5 C)  TempSrc: Oral Oral Oral Oral  SpO2:  96% 93% 93%  Weight: 104.3 kg     Height: 6' (1.829 m)       Intake/Output Summary (Last 24 hours) at 08/10/2021 1206 Last data filed at 08/10/2021 0700 Gross per 24 hour  Intake 748.15 ml  Output 1400 ml  Net -651.85 ml   Last 3 Weights 08/09/2021 08/07/2021 03/15/2021  Weight (lbs) 230 lb 230 lb 239 lb  Weight (kg) 104.327 kg 104.327 kg 108.41 kg      Telemetry    Normal sinus rhythm- Personally Reviewed  ECG    - Personally Reviewed  Physical Exam   GEN: No acute distress.   Neck: No JVD Cardiac: RRR, no murmurs, rubs, or gallops.  Respiratory: Clear to auscultation  bilaterally. GI: Soft, nontender, non-distended  MS: No edema; No deformity. Neuro:  Nonfocal  Psych: Normal affect   Labs    High Sensitivity Troponin:   Recent Labs  Lab 08/07/21 1122 08/07/21 1324 08/08/21 0133 08/08/21 0700  TROPONINIHS 324* 1,725* >24,000* >24,000*      Chemistry Recent Labs  Lab 08/07/21 1122 08/08/21 0700 08/09/21 0652 08/10/21 0558  NA 140 141 140 138  K 4.6 4.6 3.8 3.5  CL 107 104 104 102  CO2 25 25 26 27   GLUCOSE 196* 113* 94 100*  BUN 50* 50* 55* 63*  CREATININE 1.78* 1.93* 1.87* 2.19*  CALCIUM 8.9 8.7* 8.5* 8.3*  PROT 6.7  --   --   --   ALBUMIN 3.7  --   --   --   AST 48*  --   --   --   ALT 48*  --   --   --   ALKPHOS 46  --   --   --   BILITOT 0.9  --   --   --   GFRNONAA 38* 34* 36* 30*  ANIONGAP 8 12 10 9      Hematology Recent Labs  Lab 08/08/21 0700 08/09/21 0652 08/10/21 0558  WBC 14.1* 11.7* 9.5  RBC 4.28 4.16* 4.11*  HGB 13.5 13.8 13.5  HCT 42.2 40.1 39.1  MCV 98.6 96.4 95.1  MCH 31.5 33.2 32.8  MCHC 32.0 34.4 34.5  RDW 14.0 14.2 13.8  PLT 196 167 149*    BNPNo results for input(s): BNP, PROBNP in the last 168 hours.   DDimer No results for input(s): DDIMER in the last 168 hours.   Radiology    CARDIAC CATHETERIZATION  Result Date: 08/08/2021 Conclusions: Severe native coronary artery disease, as detailed below, which is similar to prior catheterization from 2020. Widely patent LIMA-LAD. Patent sequential SVG-RPDA-distal LCx, though the RPDA/RCA appears to be occluded at the side-side anastomosis, new from 2020. Known to be occluded SVG-OM, not engaged on today's study. Severely elevated left ventricular filling pressure (LVEDP 35 mmHg with significant respiratory variation). Recommendations: Restart IV heparin 8 hours after sheath removal.  Transition to apixaban as soon as tomorrow if no evidence of bleeding/vascular complication. Initiate diuresis with furosemide 80 mg IV twice daily with close monitoring of  renal function. Optimize antianginal and evidence-based heart failure therapy. Aggressive secondary prevention of coronary artery disease. Nelva Bush, MD [facility]   Cardiac Studies    Cardiac catheterization performed this admission August 08, 2021 Severe native coronary artery disease, as detailed below, which is similar to prior catheterization from 2020. Widely patent LIMA-LAD. Patent sequential SVG-RPDA-distal LCx, though the RPDA/RCA appears to be occluded at the side-side anastomosis, new from 2020. Known to be occluded SVG-OM, not engaged on today's study. Severely elevated left ventricular filling pressure (LVEDP 35 mmHg with significant respiratory variation).   Recommendations: Restart IV heparin 8 hours after sheath removal.  Transition to apixaban as soon as tomorrow if no evidence of bleeding/vascular complication. Initiate diuresis with furosemide 80 mg IV twice daily with close monitoring of renal function. Optimize antianginal and evidence-based heart failure therapy. Aggressive secondary prevention of coronary artery disease.  Echo  1. Left ventricular ejection fraction, by estimation, is 30 to 35%. The  left ventricle has moderately decreased function. Left ventricular  endocardial border not optimally defined to evaluate regional wall motion.  The left ventricular internal cavity  size was severely dilated. There is mild left ventricular hypertrophy.  Left ventricular diastolic parameters are indeterminate.   2. Right ventricular systolic function was not well visualized. The right  ventricular size is not well visualized.   3. Left atrial size was moderately dilated.   4. The mitral valve was not well visualized. Mild to moderate mitral  valve regurgitation. No evidence of mitral stenosis.   5. The aortic valve was not well visualized. Aortic valve regurgitation  is not visualized. No aortic stenosis is present.   Patient Profile     80 y.o. male  CAD/CABG, paroxysmal atrial fibrillation on Eliquis, CKD 3, hypertension, hyperlipidemia, ischemic cardiomyopathy presenting with chest pain and diagnosed with NSTEMI and A. fib RVR.  Significant distal RCA disease noted, not amendable to intervention.  Medical management advised.  Maintaining sinus rhythm on amiodarone.  Patient has indicated he would like to go home today Numerous active issues as detailed below  Chest pain, NSTEMI. Hx of cad/cagb -Left heart cath with distal RCA/PDA disease not amenable to intervention Occluded sequential vein graft -Medical therapy with aspirin, Lipitor, Coreg, Zetia. Asymptomatic, denies angina   2.  Ischemic cardiomyopathy.  EF 30 to 35% -Continue Coreg, Lasix Given worsening renal dysfunction on IV Lasix, holding Lasix, hold benazepril, Benazepril can be restarted as outpatient No room to advance his carvedilol given blood pressures running less  than 110   3.  Paroxysmal atrial fibrillation -Maintaining sinus rhythm  -Continue p.o. amiodarone, 400 mg twice daily 7 days then down to 200 twice daily Continue carvedilol -Restart Eliquis 2.5 twice daily  4.  COVID-19 Being treated with remdesivir, reports no shortness of breath  5.  Hyperlipidemia Continue Lipitor Zetia  6.  Acute on chronic renal failure Baseline creatinine around 1 7, up to 2.19 after contrast, IV Lasix Lasix now on hold Outpatient follow-up for lab work   Total encounter time more than 35 minutes  Greater than 50% was spent in counseling and coordination of care with the patient    For questions or updates, please contact [place] Please consult [email or web address] for contact info under        Signed, Ida Rogue, MD  08/10/2021, 12:06 PM

## 2021-08-10 NOTE — Progress Notes (Signed)
Patient is being discharged home. No change in previous assessment. No complaints of pain. He is now on room air and denies shortness of breath and 02 sats have been stable.

## 2021-08-10 NOTE — Consult Note (Signed)
ANTICOAGULATION CONSULT NOTE  Pharmacy Consult for Heparin drip Indication: chest pain/ACS  Patient Measurements: Heparin Dosing Weight: 93kg  Labs: Recent Labs    08/07/21 1122 08/07/21 1122 08/07/21 1324 08/08/21 0133 08/08/21 0700 08/08/21 1330 08/09/21 0652 08/09/21 1015 08/09/21 1740 08/10/21 0558  HGB 12.5*  --   --   --  13.5  --  13.8  --   --  13.5  HCT 37.9*  --   --   --  42.2  --  40.1  --   --  39.1  PLT 212  --   --   --  196  --  167  --   --  149*  APTT 29  --   --  >160*  --  >160*  --  56*  --   --   LABPROT 14.1  --   --   --   --   --   --   --   --   --   INR 1.1  --   --   --   --   --   --   --   --   --   HEPARINUNFRC  --    < >  --  >1.10* >1.10*  --   --  0.58 0.57 0.50  CREATININE 1.78*  --   --   --  1.93*  --  1.87*  --   --  2.19*  TROPONINIHS 324*  --  1,725* >24,000* >24,000*  --   --   --   --   --    < > = values in this interval not displayed.     Estimated Creatinine Clearance: 33 mL/min (A) (by C-G formula based on SCr of 2.19 mg/dL (H)).   Medical History: Past Medical History:  Diagnosis Date   Arthritis    BPH (benign prostatic hyperplasia)    CAD (coronary artery disease)    a. 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 06/2018 Cath: Native 3VD, 2/3 patent grafts (VG->OM2 100)->Med rx; c. 05/2019 NSTEMI/Cath:  LM nl, LAD 99ost, 133m LCX 1057mRCA 100p, LIMA->LAD ok, VG->RPDA ok (? jump to OM), VG->OM not visualized (prev occluded). LVEDP 3095m-->Med Rx.   CKD (chronic kidney disease), stage III (HCC)    Gout    Gouty arthropathy, chronic, with tophi    Hernia, abdominal    HFrEF (heart failure with reduced ejection fraction) ([facility]  a. 06/2018 Echo: EF 45-50%, mild MR; b. 05/2019 Echo: EF 30-35%, diff HK.   Hyperlipidemia LDL goal <70    Hypertension    Ischemic cardiomyopathy    a. 06/2018 Echo: EF 45-50%; b. 05/2019 Echo: EF 30-35%, diff HK.   MGUS (monoclonal gammopathy of unknown significance)    Myocardial  infarction (HCC)    PAF (paroxysmal atrial fibrillation) [place]  a. Dx 05/2019-->CHA2DS2VASc = 5-->Eliquis.   Renal cyst     Medications:  Medications Prior to Admission  Medication Sig Dispense Refill Last Dose   acetaminophen (TYLENOL) 500 MG tablet Take 500 mg by mouth every 6 (six) hours as needed for moderate pain, headache or mild pain.   PRN at PRN   amLODipine-benazepril (LOTREL) 5-20 MG capsule Take 1 capsule by mouth daily.   08/07/2021   atorvastatin (LIPITOR) 80 MG tablet Take 80 mg by mouth daily.    08/07/2021   carvedilol (COREG) 25 MG tablet Take 1 tablet (25 mg total) by mouth 2 (two) times  daily with a meal. 180 tablet 3 08/07/2021   Coenzyme Q10 (COQ10) 100 MG CAPS Take 100 mg by mouth daily.   08/07/2021   ELIQUIS 2.5 MG TABS tablet TAKE 1 TABLET(2.5 MG) BY MOUTH TWICE DAILY 180 tablet 1 08/07/2021 at AM   ezetimibe (ZETIA) 10 MG tablet TAKE 1 TABLET(10 MG) BY MOUTH DAILY 90 tablet 1 08/07/2021   famotidine (PEPCID) 20 MG tablet Take 20 mg by mouth 2 (two) times daily.   08/07/2021   furosemide (LASIX) 20 MG tablet TAKE 1 TABLET(20 MG) BY MOUTH DAILY 90 tablet 3 08/07/2021   isosorbide mononitrate (IMDUR) 60 MG 24 hr tablet TAKE 1 TABLET(60 MG) BY MOUTH DAILY 90 tablet 0 08/07/2021   Multiple Vitamins-Minerals (MENS 50+ MULTI VITAMIN/MIN) TABS Take 1 tablet by mouth daily.    08/07/2021   Omega-3 1000 MG CAPS Take 1,000 mg by mouth daily.    08/07/2021   predniSONE (STERAPRED UNI-PAK 21 TAB) 10 MG (21) TBPK tablet See admin instructions. follow package directions   08/07/2021 at AM   nitroGLYCERIN (NITROSTAT) 0.4 MG SL tablet Place 1 tablet (0.4 mg total) under the tongue every 5 (five) minutes as needed for chest pain (Nitro if taking third dose, call 911). 25 tablet 3 PRN at PRN    Assessment: Patient with PMH relevant CAD s/p CABG, HF, Afib (on eliquis), CKD3,subclavian stenosis and hyperlipidemia. Patient presents to ED with chest pain. Pharmacy consulted to manage heparin gtt for  ACS.  Heparin Dosing Weight: 93kg  Goal of Therapy:  Heparin level 0.3-0.7 units/ml aPTT 66-102 seconds Monitor platelets by anticoagulation protocol: Yes  Date Time aPTT/HL Rate/Comment 0906 0133 aPTT >160  supratherapeutic; 1250>950 un/hr 0906  1330   aPTT >160  supratherapeutic; 950>700 un/hr 0906  1600     -- / --  Heparin stopped for cath procedure.    7703 0203     -- / --  Heparin restarted 8h post sheath removal. 0907 1015 56s / 0.58 Thera x1; seeing correlation trend anti-Xa's 0907 1740 -- / 0.57 Thera x2; 700 un/hr 0908 0558 -- / 0.50 Therapeutic; 700 un/hr  Baseline Labs: aPTT - 29s INR - 1.1 Hgb - 12.5>13.5>13.8 Plts - 212>196>167>149   Plan: Heparin level remains therapeutic now X3 (0.50; goal 0.3-0.7). Will CTM daily hereafter. Plts slight downward trend; H/H stable. Continue heparin at current rate of 700 units/hr  Monitor daily heparin level, CBC, & s/s of bleed  Lorna Dibble, PharmD,BCCP Clinical Pharmacist   08/10/2021 7:57 AM

## 2021-08-21 ENCOUNTER — Telehealth: Payer: Self-pay | Admitting: Medical

## 2021-08-21 NOTE — Telephone Encounter (Signed)
Pt c/o swelling: STAT is pt has developed SOB within 24 hours  If swelling, where is the swelling located? Feet and ankles   How much weight have you gained and in what time span? No   Have you gained 3 pounds in a day or 5 pounds in a week? no  Do you have a log of your daily weights (if so, list)? No   Are you currently taking a fluid pill? yes  Are you currently SOB? No   Have you traveled recently? No  Recent cath

## 2021-08-21 NOTE — Telephone Encounter (Signed)
Recommend increasing furosemide to 40 mg once daily.

## 2021-08-21 NOTE — Telephone Encounter (Signed)
Spoke with pt.  Pt c/o swelling in both feet and ankles that began yesterday.  Pt denies h/o swelling in feet. When presses on skin, indention is left on both feet.   Pt denies SOB. Denies weight gain, although he does not weigh regularly.  Denies extra sodium in diet.  Pt had cath 08/08/21.  Hospital f/u scheduled 10/4 with [name], [place].   Confirmed pt is taking Lasix 20 mg daily and verified pt is taking all other medications per med list.  Pt is elevating feet while sitting.  Pt does also report he was treated for Covid while in the hospital and is not sure if this could be any type of response to s/p Covid.   Notified pt I will make Dr. Fletcher Anon aware of new s/s reported of swelling.  Will call pt with further recc.  Pt has no further questions/concerns at this time.

## 2021-08-22 MED ORDER — FUROSEMIDE 20 MG PO TABS: ORAL_TABLET | ORAL | Status: DC

## 2021-08-22 NOTE — Telephone Encounter (Signed)
Noted. Patients medication list updated.

## 2021-08-22 NOTE — Telephone Encounter (Signed)
Yes that is fine he can do an additional 20 mg as needed.

## 2021-08-22 NOTE — Telephone Encounter (Signed)
Spoke with the patient. Patient sts that he took an additional 20 mg of lasix yesterday afternoon. Patient sts that he has had great improvement this morning with his LE swelling and has almost resolved..   Advise him of Dr. Tyrell Antonio recommendation for him to increase Lasix to 40 mg daily. Patient would like to know if he can continue Lasix 20 mg daily and take an additional daily prn 20 mgs if he has swelling or sob.  Adv the patient that I will fwd the question  back to Dr. Fletcher Anon and call back if he has other recommendation. Patient is agreeable with the plan and voiced appreciation for the call.

## 2021-08-27 ENCOUNTER — Other Ambulatory Visit: Payer: Self-pay | Admitting: Cardiovascular Disease

## 2021-08-28 NOTE — Telephone Encounter (Signed)
Refill request

## 2021-08-28 NOTE — Telephone Encounter (Signed)
Prescription refill request for Eliquis received. Indication:afib Last office visit:walker 03/01/21 Scr:2.2 08/17/21 Age: 60mWWeight:20.2kg

## 2021-09-05 ENCOUNTER — Ambulatory Visit: Payer: Medicare Other | Admitting: Medical

## 2021-09-07 NOTE — Progress Notes (Signed)
Cardiology Office Note:    Date:  09/08/2021   ID:  Rebeca Allegra, DOB 1941/10/31, MRN [id number]  PCP:  [facility]  [facility] Cardiologist:  [name], MD  [facility] Electrophysiologist:  None   Referring MD: [facility]*   Chief Complaint: Hospital follow-up  History of Present Illness:    Jeff Henderson is a 80 y.o. male with a hx of coronary artery disease s/p CABG 3154, chronic systolic heart failure, paroxysmal atrial fibrillation on Eliquis, hypertension, subclavian stenosis, hyperlipidemia, carotid artery disease, MGUS, CKD 3, gout, BPH who presents for hospital follow-up.  He has a history of CAD s/p CABG in 1995.  Subclavian artery stenosis identified during [facility] 2011.  He was admitted to [facility] 05/2018 for non-STEMI and subsequent LHC showed severe 3v CAD including CTO to the pRCA and m LAD, chronically occluded dLCx, grafted OM.  90%s ostial LAD, supplying 2 small diagonal branches.  EF 45 to 50% on echo.  Admitted 05/2019 with chest pain in the setting of new atrial fibrillation with RVR.  Self converted to NSR.  Repeat LHC without change in anatomy.  He was started on Eliquis.  Echo showed EF 30 to 35%.  When last seen in the office, he was doing well from a cardiac standpoint and reported exercise 3 times per week.  Hospitalized 08/07/21 - 08/10/21 for NSTEMI and COVID-19 infection found to be in rapid afib. Cath 9/6 showed possible new occlusion of RCA and lesion not amenable to PCI treated with heparin. Echo showed LVEF 30-35%, severely elevated LVEDP noted during catheterization. He was diuresed. Started on amiodarone for afib and converted to SB.   Today, the patient reports persistent fatigue since the hospitalization. Feels weaker and doesn't feel he has the strength he used to have. Breathing is ok, a little short. No chest pain. He is not taking amiodarone due to possible side effects. He never started it post-discharge. HE  is not sleeping well at night. Reason for amiodarone was discussed in detail. He would like to not start it. EKG today showed SR heart rate 60bpm. Reports compliance with Eliquis. He is on amlodipine/benazapril. HE is taking lasix 90m BID. NO LLE, orthopnea, pnd.  BMET   Past Medical History:  Diagnosis Date   Arthritis    BPH (benign prostatic hyperplasia)    CAD (coronary artery disease)    a. 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 06/2018 Cath: Native 3VD, 2/3 patent grafts (VG->OM2 100)->Med rx; c. 05/2019 NSTEMI/Cath:  LM nl, LAD 99ost, 1025mLCX 1002mCA 100p, LIMA->LAD ok, VG->RPDA ok (? jump to OM), VG->OM not visualized (prev occluded). LVEDP 78m72m->Med Rx.   CKD (chronic kidney disease), stage III (HCC)    Gout    Gouty arthropathy, chronic, with tophi    Hernia, abdominal    HFrEF (heart failure with reduced ejection fraction) (HCC)[place] a. 06/2018 Echo: EF 45-50%, mild MR; b. 05/2019 Echo: EF 30-35%, diff HK.   Hyperlipidemia LDL goal <70    Hypertension    Ischemic cardiomyopathy    a. 06/2018 Echo: EF 45-50%; b. 05/2019 Echo: EF 30-35%, diff HK.   MGUS (monoclonal gammopathy of unknown significance)    Myocardial infarction (HCC)    PAF (paroxysmal atrial fibrillation) (HCC)[place] a. Dx 05/2019-->CHA2DS2VASc = 5-->Eliquis.   Renal cyst     Past Surgical History:  Procedure Laterality Date   CARDIAC CATHETERIZATION     carpel  tunnel     COLONOSCOPY WITH PROPOFOL N/A 01/14/2020   Procedure: COLONOSCOPY WITH PROPOFOL;  Surgeon: Toledo, Benay Pike, MD;  Location: [facility] ENDOSCOPY;  Service: Gastroenterology;  Laterality: N/A;   CORONARY ARTERY BYPASS GRAFT     ESOPHAGOGASTRODUODENOSCOPY (EGD) WITH PROPOFOL N/A 01/14/2020   Procedure: ESOPHAGOGASTRODUODENOSCOPY (EGD) WITH PROPOFOL;  Surgeon: Toledo, Benay Pike, MD;  Location: [facility] ENDOSCOPY;  Service: Gastroenterology;  Laterality: N/A;   ESOPHAGOGASTRODUODENOSCOPY (EGD) WITH PROPOFOL N/A 03/15/2021   Procedure:  ESOPHAGOGASTRODUODENOSCOPY (EGD) WITH PROPOFOL;  Surgeon: Toledo, Benay Pike, MD;  Location: [facility] ENDOSCOPY;  Service: Gastroenterology;  Laterality: N/A;   JOINT REPLACEMENT Bilateral 2002  2011   LEFT HEART CATH AND CORONARY ANGIOGRAPHY N/A 06/03/2018   Procedure: LEFT HEART CATH AND CORS/GRAFTS ANGIOGRAPHY;  Surgeon: Isaias Cowman, MD;  Location: [place] [facility];  Service: Cardiovascular;  Laterality: N/A;   LEFT HEART CATH AND CORS/GRAFTS ANGIOGRAPHY Bilateral 05/07/2019   Procedure: LEFT HEART CATH AND CORS/GRAFTS ANGIOGRAPHY;  Surgeon: Minna Merritts, MD;  Location: [place] CV LAB;  Service: Cardiovascular;  Laterality: Bilateral;   LEFT HEART CATH AND CORS/GRAFTS ANGIOGRAPHY N/A 08/08/2021   Procedure: LEFT HEART CATH AND CORS/GRAFTS ANGIOGRAPHY;  Surgeon: Nelva Bush, MD;  Location: [place] CV LAB;  Service: Cardiovascular;  Laterality: N/A;   REPLACEMENT TOTAL KNEE     VEIN BYPASS SURGERY      Current Medications: Current Meds  Medication Sig   acetaminophen (TYLENOL) 500 MG tablet Take 500 mg by mouth every 6 (six) hours as needed for moderate pain, headache or mild pain.   apixaban (ELIQUIS) 2.5 MG TABS tablet TAKE 1 TABLET(2.5 MG) BY MOUTH TWICE DAILY   aspirin 81 MG chewable tablet Chew 1 tablet (81 mg total) by mouth daily.   atorvastatin (LIPITOR) 80 MG tablet Take 80 mg by mouth daily.    carvedilol (COREG) 3.125 MG tablet Take 1 tablet (3.125 mg total) by mouth 2 (two) times daily with a meal.   Coenzyme Q10 (COQ10) 100 MG CAPS Take 100 mg by mouth daily.   ezetimibe (ZETIA) 10 MG tablet TAKE 1 TABLET(10 MG) BY MOUTH DAILY   famotidine (PEPCID) 20 MG tablet Take 20 mg by mouth 2 (two) times daily.   furosemide (LASIX) 20 MG tablet Take 1 tablet (20 mg total) by mouth daily.   isosorbide mononitrate (IMDUR) 60 MG 24 hr tablet TAKE 1 TABLET(60 MG) BY MOUTH DAILY   Multiple Vitamins-Minerals (MENS 50+ MULTI VITAMIN/MIN) TABS Take 1 tablet by mouth  daily.    nitroGLYCERIN (NITROSTAT) 0.4 MG SL tablet Place 1 tablet (0.4 mg total) under the tongue every 5 (five) minutes as needed for chest pain (Nitro if taking third dose, call 911).   Omega-3 1000 MG CAPS Take 1,000 mg by mouth daily.    [DISCONTINUED] amLODipine-benazepril (LOTREL) 5-20 MG capsule Take 1 capsule by mouth daily. HOLD THIS MEDICATION UNTIL YOU SEE YOUR CARDIOLOGIST AS AN OUTPATIENT   [DISCONTINUED] furosemide (LASIX) 20 MG tablet Take 1 tablet 20 mg daily. Take an additional 20 mg daily as needed for swelling.     Allergies:   Penicillins   Social History   Socioeconomic History   Marital status: Single    Spouse name: Not on file   Number of children: Not on file   Years of education: Not on file   Highest education level: Not on file  Occupational History   Not on file  Tobacco Use   Smoking status: Never   Smokeless tobacco: Never  Vaping Use   Vaping Use: Never used  Substance and Sexual Activity   Alcohol use: No    Alcohol/week: 0.0 standard drinks   Drug use: No   Sexual activity: Yes  Other Topics Concern   Not on file  Social History Narrative   Not on file   Social Determinants of Health   Financial Resource Strain: Not on file  Food Insecurity: Not on file  Transportation Needs: Not on file  Physical Activity: Not on file  Stress: Not on file  Social Connections: Not on file     Family History: The patient's family history includes Asthma in his mother; Diabetes in his father; Hypertension in his father.  ROS:   Please see the history of present illness.     All other systems reviewed and are negative.  EKGs/Labs/Other Studies Reviewed:    The following studies were reviewed today:  Echo 08/08/21 1. Left ventricular ejection fraction, by estimation, is 30 to 35%. The  left ventricle has moderately decreased function. Left ventricular  endocardial border not optimally defined to evaluate regional wall motion.  The left  ventricular internal cavity  size was severely dilated. There is mild left ventricular hypertrophy.  Left ventricular diastolic parameters are indeterminate.   2. Right ventricular systolic function was not well visualized. The right  ventricular size is not well visualized.   3. Left atrial size was moderately dilated.   4. The mitral valve was not well visualized. Mild to moderate mitral  valve regurgitation. No evidence of mitral stenosis.   5. The aortic valve was not well visualized. Aortic valve regurgitation  is not visualized. No aortic stenosis is present.   Cardiac cath 08/08/21 Conclusions: Severe native coronary artery disease, as detailed below, which is similar to prior catheterization from 2020. Widely patent LIMA-LAD. Patent sequential SVG-RPDA-distal LCx, though the RPDA/RCA appears to be occluded at the side-side anastomosis, new from 2020. Known to be occluded SVG-OM, not engaged on today's study. Severely elevated left ventricular filling pressure (LVEDP 35 mmHg with significant respiratory variation).   Recommendations: Restart IV heparin 8 hours after sheath removal.  Transition to apixaban as soon as tomorrow if no evidence of bleeding/vascular complication. Initiate diuresis with furosemide 80 mg IV twice daily with close monitoring of renal function. Optimize antianginal and evidence-based heart failure therapy. Aggressive secondary prevention of coronary artery disease.   Nelva Bush, MD [facility]  Coronary Diagrams  Diagnostic Dominance: Right   EKG:  EKG is  ordered today.  The ekg ordered today demonstrates NSR, 60bpm, PVCs  Recent Labs: 08/07/2021: ALT 48; Magnesium 2.1; TSH 0.584 08/10/2021: BUN 63; Creatinine, Ser 2.19; Hemoglobin 13.5; Platelets 149; Potassium 3.5; Sodium 138  Recent Lipid Panel    Component Value Date/Time   CHOL 90 07/11/2020 1101   CHOL 178 05/24/2020 1026   TRIG 51 07/11/2020 1101   HDL 31 (L) 07/11/2020 1101   HDL  30 (L) 05/24/2020 1026   CHOLHDL 2.9 07/11/2020 1101   VLDL 10 07/11/2020 1101   LDLCALC 49 07/11/2020 1101   LDLCALC 130 (H) 05/24/2020 1026     Physical Exam:    VS:  BP (!) 140/50 (BP Location: Left Arm, Patient Position: Sitting, Cuff Size: Normal)   Pulse 60   Ht 5' 9"  (1.753 m)   Wt 248 lb 4 oz (112.6 kg)   SpO2 96%   BMI 36.66 kg/m     Wt Readings from Last 3 Encounters:  09/08/21 248 lb 4 oz (  112.6 kg)  08/09/21 230 lb (104.3 kg)  03/15/21 239 lb (108.4 kg)     GEN:  Well nourished, well developed in no acute distress HEENT: Normal NECK: No JVD; No carotid bruits LYMPHATICS: No lymphadenopathy CARDIAC: RRR, no murmurs, rubs, gallops RESPIRATORY:  Clear to auscultation without rales, wheezing or rhonchi  ABDOMEN: Soft, non-tender, non-distended MUSCULOSKELETAL:  No edema; No deformity  SKIN: Warm and dry NEUROLOGIC:  Alert and oriented x 3 PSYCHIATRIC:  Normal affect   ASSESSMENT:    1. Coronary artery disease involving native coronary artery of native heart without angina pectoris   2. Essential hypertension   3. Paroxysmal atrial fibrillation (HCC)   4. Hyperlipidemia, unspecified hyperlipidemia type   5. Chronic anticoagulation   6. Chronic systolic heart failure (HCC)    PLAN:    In order of problems listed above:  NSTEMI CAD s/p CABG 1995 Recent NSTEMI in the setting of COVID infection and rapid afib. Cath showed possible new occlusion of RCA and lesion not amenable to PCI treated with heparin. Echo showed LVEF 30-35%. Patient was treated with IV heparin. He was discharged on Aspirin 56m, will ask MD how long therapy with Eliquis and Aspirin, cath suggests may be indefinite. Patient denies further chest pain. Continue Coreg, Imdur and statin.  HFrEF Echo during recent admission showed LVEF 30-35%, prior echo 12/2019 showed LVEF 35-40%. Stop amlodipine-lisinopril for low EF. Recent labs showed some dehydration, decrease lasix to 264mdaily. He s  euvolemic on exam today. BMET in a week. CHF education dicussed. If kidney function is better on follow-up labs can consider adding Losartan/Entresto. Continue Imdur. Continue GDMT as tolerated.  Paroxysmal Afib Patient never started amiodarone for possible side effects. Amiodarone was dicussed in detail with the patient, and the reason for taking it, for which he is still reluctant to take. EKG today shows SR with heart rate of 60bpm. Given he is in NSR with borderline heart rate I think it's reasonable to not re-start amiodarone. Continue Coreg 3.12551mor rate control. If he has recurrent afib may need to consider amiodarone. Re-assess at follow-up.  CKD Scr 2.2 BUN 49 on follow-up labs. Decrease lasix to 29m59mily. BMET in a week  HLD LDL 49 07/2020. Continue Lipitor and Zetia.   Disposition: Follow up  in 3-4 weeks  with MD/APP    Signed, Mauri Tolen H FuNinfa Meeker-C  09/08/2021 12:51 PM    [facility]

## 2021-09-08 ENCOUNTER — Other Ambulatory Visit: Payer: Self-pay

## 2021-09-08 ENCOUNTER — Encounter: Payer: Self-pay | Admitting: Medical

## 2021-09-08 ENCOUNTER — Ambulatory Visit (INDEPENDENT_AMBULATORY_CARE_PROVIDER_SITE_OTHER): Payer: Medicare Other | Admitting: Medical

## 2021-09-08 VITALS — BP 140/50 | HR 60 | Ht 69.0 in | Wt 248.2 lb

## 2021-09-08 DIAGNOSIS — I5022 Chronic systolic (congestive) heart failure: Secondary | ICD-10-CM

## 2021-09-08 DIAGNOSIS — I480 Paroxysmal atrial fibrillation: Secondary | ICD-10-CM

## 2021-09-08 DIAGNOSIS — I10 Essential (primary) hypertension: Secondary | ICD-10-CM

## 2021-09-08 DIAGNOSIS — E785 Hyperlipidemia, unspecified: Secondary | ICD-10-CM | POA: Diagnosis not present

## 2021-09-08 DIAGNOSIS — I2510 Atherosclerotic heart disease of native coronary artery without angina pectoris: Secondary | ICD-10-CM

## 2021-09-08 DIAGNOSIS — Z7901 Long term (current) use of anticoagulants: Secondary | ICD-10-CM

## 2021-09-08 MED ORDER — FUROSEMIDE 20 MG PO TABS: 20.0000 mg | ORAL_TABLET | Freq: Every day | ORAL | 3 refills | Status: DC

## 2021-09-08 NOTE — Patient Instructions (Addendum)
Medication Instructions:  Your physician has recommended you make the following change in your medication:   STOP Lotrel  REDUCE the Lasix to 1 tablet daily    *If you need a refill on your cardiac medications before your next appointment, please call your pharmacy*   Lab Work: 1 WEEK:  BMET  If you have labs (blood work) drawn today and your tests are completely normal, you will receive your results only by: [facility] (if you have MyChart) OR A paper copy in the mail If you have any lab test that is abnormal or we need to change your treatment, we will call you to review the results.   Testing/Procedures: None ordered   Follow-Up: At [facility], you and your health needs are our priority.  As part of our continuing mission to provide you with exceptional heart care, we have created designated Provider Care Teams.  These Care Teams include your primary Cardiologist (physician) and Advanced Practice Providers (APPs -  Physician Assistants and Nurse Practitioners) who all work together to provide you with the care you need, when you need it.  We recommend signing up for the patient portal called "MyChart".  Sign up information is provided on this After Visit Summary.  MyChart is used to connect with patients for Virtual Visits (Telemedicine).  Patients are able to view lab/test results, encounter notes, upcoming appointments, etc.  Non-urgent messages can be sent to your provider as well.   To learn more about what you can do with MyChart, go to [email or web address].    Your next appointment:   3-4 WEEKS  The format for your next appointment:   In Person  Provider:   You may see [name], MDor one of the following Advanced Practice Providers on your designated Care Team:   Murray Hodgkins, NP Christell Faith, PA-C Marrianne Mood, PA-C [name], [place]   Other Instructions

## 2021-09-11 ENCOUNTER — Telehealth: Payer: Self-pay | Admitting: *Deleted

## 2021-09-11 NOTE — Telephone Encounter (Signed)
-----   Message from [place], PA-C sent at 09/11/2021  1:17 PM EDT ----- Please call patient and tell him OK to dc Aspirin per Dr. Fletcher Anon Thanks  ----- Message ----- From: Wellington Hampshire, MD Sent: 09/08/2021   3:39 PM EDT To: Cadence Ninfa Meeker, PA-C  Given that no PCI was performed, aspirin can be discontinued given that he is on Eliquis.   ----- Message ----- From: Antony [place], PA-C Sent: 09/08/2021  12:51 PM EDT To: Wellington Hampshire, MD  Hospital follow-up. Feels general fatigue. No anginal symptoms. Stop amlodipine/lisinopril for low EF. Decrese lasix to 71m daily. BMET in a week. GDMT as tolerated. He did not start amiodarone for afib due to possible side effects. EKG shows SR. Continue BB for now.  With recent NSTEMI, how long should he be on Eliquis and ASA? Is this indefinite?

## 2021-09-11 NOTE — Telephone Encounter (Signed)
Patient returning call.

## 2021-09-11 NOTE — Telephone Encounter (Signed)
Spoke with pt.  Notified per message below may stop taking Aspirin.  Pt voiced understanding and will stop taking Aspirin and will continue Eliquis.  Aspirin has already been removed from pt's med list.  Pt has no further questions at this time.

## 2021-09-11 NOTE — Telephone Encounter (Signed)
Attempted to call pt to discuss recc below.  No answer. Lmtcb.

## 2021-09-13 NOTE — Addendum Note (Signed)
Addended by: Britt Bottom on: 09/13/2021 02:27 PM   Modules accepted: Orders

## 2021-09-14 ENCOUNTER — Other Ambulatory Visit (INDEPENDENT_AMBULATORY_CARE_PROVIDER_SITE_OTHER): Payer: Medicare Other

## 2021-09-14 ENCOUNTER — Other Ambulatory Visit: Payer: Self-pay

## 2021-09-14 DIAGNOSIS — I10 Essential (primary) hypertension: Secondary | ICD-10-CM | POA: Diagnosis not present

## 2021-09-14 DIAGNOSIS — Z7901 Long term (current) use of anticoagulants: Secondary | ICD-10-CM

## 2021-09-14 DIAGNOSIS — I2510 Atherosclerotic heart disease of native coronary artery without angina pectoris: Secondary | ICD-10-CM

## 2021-09-14 DIAGNOSIS — E785 Hyperlipidemia, unspecified: Secondary | ICD-10-CM

## 2021-09-14 DIAGNOSIS — I480 Paroxysmal atrial fibrillation: Secondary | ICD-10-CM

## 2021-09-15 ENCOUNTER — Telehealth: Payer: Self-pay

## 2021-09-15 DIAGNOSIS — Z79899 Other long term (current) drug therapy: Secondary | ICD-10-CM

## 2021-09-15 DIAGNOSIS — I5023 Acute on chronic systolic (congestive) heart failure: Secondary | ICD-10-CM

## 2021-09-15 DIAGNOSIS — I5022 Chronic systolic (congestive) heart failure: Secondary | ICD-10-CM

## 2021-09-15 LAB — BASIC METABOLIC PANEL
BUN/Creatinine Ratio: 22 (ref 10–24)
BUN: 41 mg/dL — ABNORMAL HIGH (ref 8–27)
CO2: 23 mmol/L (ref 20–29)
Calcium: 9.5 mg/dL (ref 8.6–10.2)
Chloride: 111 mmol/L — ABNORMAL HIGH (ref 96–106)
Creatinine, Ser: 1.86 mg/dL — ABNORMAL HIGH (ref 0.76–1.27)
Glucose: 124 mg/dL — ABNORMAL HIGH (ref 70–99)
Potassium: 5 mmol/L (ref 3.5–5.2)
Sodium: 148 mmol/L — ABNORMAL HIGH (ref 134–144)
eGFR: 36 mL/min/{1.73_m2} — ABNORMAL LOW (ref >59)

## 2021-09-15 MED ORDER — ENTRESTO 24-26 MG PO TABS: 1.0000 | ORAL_TABLET | Freq: Two times a day (BID) | ORAL | 5 refills | Status: AC

## 2021-09-15 NOTE — Telephone Encounter (Signed)
Attempted phone call to pt.  OK per Epic to leave voicemail message.  Advised per Cadence Furth,PA-C BMET is stable.  Begin Entresto 24-21m - 1 tablet by mouth twice daily and recheck BMET in one week.  Message also sent to pt's MyChart.

## 2021-09-15 NOTE — Telephone Encounter (Signed)
-----   Message from [place], PA-C sent at 09/15/2021  3:42 PM EDT ----- BMET stable. Lets add Entresto 24-74m BID for HFrEF. BMET in a week

## 2021-10-02 ENCOUNTER — Other Ambulatory Visit: Payer: Self-pay | Admitting: Cardiovascular Disease

## 2021-10-06 ENCOUNTER — Ambulatory Visit: Payer: Medicare Other | Admitting: Medical

## 2021-10-23 NOTE — Progress Notes (Signed)
Cardiology Office Note    Date:  10/24/2021   ID:  Jeff Henderson, Jeff Henderson Sep 15, 1941, MRN [id number]  PCP:  [facility]  Cardiologist:  [name], MD  Electrophysiologist:  None   Chief Complaint: Follow-up  History of Present Illness:   Jeff Henderson is a 80 y.o. male with history of CAD status post CABG in 1995, HFrEF secondary to ICM, PAF, COVID, left subclavian artery stenosis noted on LHC at [facility] in 2011, HTN, HLD, carotid artery disease, MGUS, CKD stage III, gout, and BPH who presents for follow-up of CAD, cardiomyopathy, and A. fib.  He was admitted to [facility] in 05/2018 with an NSTEMI.  LHC by outside group showed severe three-vessel CAD including CTO of the proximal RCA and mid LAD.  The distal LCx and grafted OM appeared chronically occluded.  There was 90% ostial LAD stenosis supplying 2 small diagonal branches.  The LIMA to LAD was patent as well as the SVG to PDA.  The stenosis within the left subclavian artery was not well visualized.  Medical therapy was recommended.  LVEF was mildly reduced by echo at 45 to 50%.  Carotid artery ultrasound in 02/2019 showed moderate bilateral carotid artery disease without evidence of significant stenosis.  No evidence of renal artery stenosis on renal artery duplex.  He was admitted in 05/2019 with chest pain in the setting of new onset A. fib with RVR with spontaneous conversion to sinus rhythm.  Repeat LHC showed no significant change in his anatomy when compared to his prior cath.  He was initiated on Eliquis.  Echo demonstrated a worsening cardiomyopathy with an EF of 30 to 35%.  Historically, escalation of GDMT has been hampered by relative hypotension.  Echo in 12/2019 showed an EF of 35 to 40%, grade 1 diastolic dysfunction, mildly dilated internal LV cavity size, global hypokinesis, normal RV systolic function and ventricular cavity size, mildly to moderately dilated left atrium.  Most recent carotid artery  ultrasound from 04/2021 showed moderate disease bilaterally with 40 to 59% bilateral ICA stenosis, antegrade flow within the bilateral vertebral arteries, stenotic right subclavian artery with normal flow hemodynamics noted within the left subclavian artery with recommendation to follow-up imaging in 12 months.  He was most recently admitted to the hospital in 08/2021 with an NSTEMI, A. fib with RVR, and COVID.  High-sensitivity troponin peaked at greater than 24,000.  Echo demonstrated a persistent cardiomyopathy with an EF of 30 to 35%, severely dilated LV internal cavity size, mild LVH, indeterminate LV diastolic function parameters, moderately dilated left atrium, mild to moderate mitral regurgitation.  LHC demonstrated severe native vessel CAD which was similar to prior cath in 2020.  There was a widely patent LIMA to LAD as well as a patent sequential SVG to RPDA to distal LCx, though the R PDA/RCA appeared to be occluded at the site of cytocide anastomosis which was new when compared to cath from 2020.  The known occluded SVG to OM was not engaged.  There was severely elevated LV filling pressures with an LVEDP estimated at 35 mmHg.  Ongoing diuresis, escalation of evidence-based medical therapy, and aggressive secondary prevention was recommended.  He converted to sinus rhythm during his admission.  At time of discharge, it was recommended he take apixaban 2.5 mg twice daily (age and serum creatinine), amiodarone, amlodipine/benazepril, aspirin, atorvastatin, carvedilol, ezetimibe, furosemide 20 mg daily, and Imdur.  He was seen in hospital follow-up on 09/08/2021 noting persistent fatigue.  He  was not taking amiodarone as he was concerned about potential adverse effects.  He was not sleeping well.  He was maintaining sinus rhythm.  He was taking Lasix 20 mg twice daily.  Amlodipine/benazepril was discontinued with recommendation to escalate GDMT as tolerated and follow-up.  Lasix was decreased to 20 mg  daily.  Follow-up labs did show an improved serum creatinine as outlined below.  Given improved renal function, it was recommended he initiate Entresto 24/26 mg twice daily, though we were unable to get in touch with the patient to provide at this recommendation.  It was subsequently recommended that he discontinue aspirin given no PCI was performed and be maintained on Eliquis.  He comes in generally not feeling well today.  He notes increased shortness of breath, lower extremity swelling, abdominal distention, orthopnea, cough and productive of clear to white sputum.  No angina, fevers, or chills.  He has been adherent to all medications, including lower dose of furosemide.  He did pick up and start Entresto.  He is drinking less than 2 L/day and does watch his salt intake.  No falls, hematochezia, or melena.  No dizziness, presyncope, or syncope.  His weight is up 8 pounds today when compared to his visit last month, and is up 13 pounds when compared to his visit in 01/2021.   Labs independently reviewed: 09/2021 - BUN 41, serum creatinine 1.86, potassium 5.0 08/2021 - albumin 3.5, AST/ALT normal, Hgb 13.5, PLT 149, TSH normal, magnesium 2.1 07/2020 - TC 86, TG 55, HDL 29, LDL 46   Past Medical History:  Diagnosis Date   Arthritis    BPH (benign prostatic hyperplasia)    CAD (coronary artery disease)    a. 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 06/2018 Cath: Native 3VD, 2/3 patent grafts (VG->OM2 100)->Med rx; c. 05/2019 NSTEMI/Cath:  LM nl, LAD 99ost, 162m LCX 1036mRCA 100p, LIMA->LAD ok, VG->RPDA ok (? jump to OM), VG->OM not visualized (prev occluded). LVEDP 3046m-->Med Rx.   CKD (chronic kidney disease), stage III (HCC)    Gout    Gouty arthropathy, chronic, with tophi    Hernia, abdominal    H[place]EF (heart failure with reduced ejection fraction) ([place]  a. 06/2018 Echo: EF 45-50%, mild MR; b. 05/2019 Echo: EF 30-35%, diff HK.   Hyperlipidemia LDL goal <70    Hypertension     Ischemic cardiomyopathy    a. 06/2018 Echo: EF 45-50%; b. 05/2019 Echo: EF 30-35%, diff HK.   MGUS (monoclonal gammopathy of unknown significance)    Myocardial infarction (HCC)    PAF ([place]oxysmal [place] fibrillation) ([place]  a. Dx 05/2019-->CHA2DS2VASc = 5-->Eliquis.   Renal cyst     Past Surgical History:  Procedure Laterality Date   CARDIAC CATHETERIZATION     carpel tunnel     COLONOSCOPY WITH PROPOFOL N/A 01/14/2020   Procedure: COLONOSCOPY WITH PROPOFOL;  Surgeon: Toledo, TeoBenay PikeD;  Location: [facility] ENDOSCOPY;  Service: Gastroenterology;  Laterality: N/A;   CORONARY ARTERY BYPASS GRAFT     ESOPHAGOGASTRODUODENOSCOPY (EGD) WITH PROPOFOL N/A 01/14/2020   Procedure: ESOPHAGOGASTRODUODENOSCOPY (EGD) WITH PROPOFOL;  Surgeon: Toledo, TeoBenay PikeD;  Location: [facility] ENDOSCOPY;  Service: Gastroenterology;  Laterality: N/A;   ESOPHAGOGASTRODUODENOSCOPY (EGD) WITH PROPOFOL N/A 03/15/2021   Procedure: ESOPHAGOGASTRODUODENOSCOPY (EGD) WITH PROPOFOL;  Surgeon: Toledo, TeoBenay PikeD;  Location: [facility] ENDOSCOPY;  Service: Gastroenterology;  Laterality: N/A;   JOINT REPLACEMENT Bilateral 2002  2011   LEFT HEART CATH AND CORONARY ANGIOGRAPHY N/A  06/03/2018   Procedure: LEFT HEART CATH AND CORS/GRAFTS ANGIOGRAPHY;  Surgeon: Isaias Cowman, MD;  Location: [place] [facility];  Service: Cardiovascular;  Laterality: N/A;   LEFT HEART CATH AND CORS/GRAFTS ANGIOGRAPHY Bilateral 05/07/2019   Procedure: LEFT HEART CATH AND CORS/GRAFTS ANGIOGRAPHY;  Surgeon: Minna Merritts, MD;  Location: [place] [facility];  Service: Cardiovascular;  Laterality: Bilateral;   LEFT HEART CATH AND CORS/GRAFTS ANGIOGRAPHY N/A 08/08/2021   Procedure: LEFT HEART CATH AND CORS/GRAFTS ANGIOGRAPHY;  Surgeon: Nelva Bush, MD;  Location: [place] [facility];  Service: Cardiovascular;  Laterality: N/A;   REPLACEMENT TOTAL KNEE     VEIN BYPASS SURGERY      Current Medications: Current Meds  Medication Sig   acetaminophen  (TYLENOL) 500 MG tablet Take 500 mg by mouth every 6 (six) hours as needed for moderate pain, headache or mild pain.   apixaban (ELIQUIS) 2.5 MG TABS tablet TAKE 1 TABLET(2.5 MG) BY MOUTH TWICE DAILY   atorvastatin (LIPITOR) 80 MG tablet Take 80 mg by mouth daily.    Coenzyme Q10 (COQ10) 100 MG CAPS Take 100 mg by mouth daily.   ezetimibe (ZETIA) 10 MG tablet TAKE 1 TABLET(10 MG) BY MOUTH DAILY   famotidine (PEPCID) 20 MG tablet Take 20 mg by mouth 2 (two) times daily.   isosorbide mononitrate (IMDUR) 60 MG 24 hr tablet TAKE 1 TABLET(60 MG) BY MOUTH DAILY   Multiple Vitamins-Minerals (MENS 50+ MULTI VITAMIN/MIN) TABS Take 1 tablet by mouth daily.    nitroGLYCERIN (NITROSTAT) 0.4 MG SL tablet Place 1 tablet (0.4 mg total) under the tongue every 5 (five) minutes as needed for chest pain (Nitro if taking third dose, call 911).   Omega-3 1000 MG CAPS Take 1,000 mg by mouth daily.    sacubitril-valsartan (ENTRESTO) 24-26 MG Take 1 tablet by mouth 2 (two) times daily.   torsemide (DEMADEX) 20 MG tablet Take 1 tablet (20 mg total) by mouth as directed. Take 2 tablets once a day for 2 days then after that CHANGE to 1 tablet (20 mg) once a day   [DISCONTINUED] furosemide (LASIX) 20 MG tablet Take 1 tablet (20 mg total) by mouth daily.    Allergies:   Penicillins   Social History   Socioeconomic History   Marital status: Single    Spouse name: Not on file   Number of children: Not on file   Years of education: Not on file   Highest education level: Not on file  Occupational History   Not on file  Tobacco Use   Smoking status: Never   Smokeless tobacco: Never  Vaping Use   Vaping Use: Never used  Substance and Sexual Activity   Alcohol use: No    Alcohol/week: 0.0 standard drinks   Drug use: No   Sexual activity: Yes  Other Topics Concern   Not on file  Social History Narrative   Not on file   Social Determinants of Health   Financial Resource Strain: Not on file  Food Insecurity:  Not on file  Transportation Needs: Not on file  Physical Activity: Not on file  Stress: Not on file  Social Connections: Not on file     Family History:  The patient's family history includes Asthma in his mother; Diabetes in his father; Hypertension in his father.  ROS:   Review of Systems  Constitutional:  Positive for malaise/fatigue. Negative for chills, diaphoresis, fever and weight loss.  HENT:  Negative for congestion.   Eyes:  Negative  for discharge and redness.  Respiratory:  Positive for cough, sputum production and shortness of breath. Negative for wheezing.        Clear to white sputum  Cardiovascular:  Positive for orthopnea and leg swelling. Negative for chest pain, palpitations, claudication and PND.  Gastrointestinal:  Negative for abdominal pain, blood in stool, heartburn, melena, nausea and vomiting.  Musculoskeletal:  Negative for falls and myalgias.  Skin:  Negative for rash.  Neurological:  Positive for weakness. Negative for dizziness, tingling, tremors, sensory change, speech change, focal weakness and loss of consciousness.  Endo/Heme/Allergies:  Does not bruise/bleed easily.  Psychiatric/Behavioral:  Negative for substance abuse. The patient is not nervous/anxious.   All other systems reviewed and are negative.   EKGs/Labs/Other Studies Reviewed:    Studies reviewed were summarized above. The additional studies were reviewed today:  LHC 08/2021: Conclusions: Severe native coronary artery disease, as detailed below, which is similar to prior catheterization from 2020. Widely patent LIMA-LAD. Patent sequential SVG-RPDA-distal LCx, though the RPDA/RCA appears to be occluded at the side-side anastomosis, new from 2020. Known to be occluded SVG-OM, not engaged on today's study. Severely elevated left ventricular filling pressure (LVEDP 35 mmHg with significant respiratory variation).   Recommendations: Restart IV heparin 8 hours after sheath removal.   Transition to apixaban as soon as tomorrow if no evidence of bleeding/vascular complication. Initiate diuresis with furosemide 80 mg IV twice daily with close monitoring of renal function. Optimize antianginal and evidence-based heart failure therapy. Aggressive secondary prevention of coronary artery disease. __________  2D echo 08/2021: 1. Left ventricular ejection fraction, by estimation, is 30 to 35%. The  left ventricle has moderately decreased function. Left ventricular  endocardial border not optimally defined to evaluate regional wall motion.  The left ventricular internal cavity  size was severely dilated. There is mild left ventricular hypertrophy.  Left ventricular diastolic parameters are indeterminate.   2. Right ventricular systolic function was not well visualized. The right  ventricular size is not well visualized.   3. Left atrial size was moderately dilated.   4. The mitral valve was not well visualized. Mild to moderate mitral  valve regurgitation. No evidence of mitral stenosis.   5. The aortic valve was not well visualized. Aortic valve regurgitation  is not visualized. No aortic stenosis is present. __________  Carotid artery ultrasound 04/2021: Summary:  Right Carotid: Velocities in the right ICA are consistent with a 40-59% stenosis. Non-hemodynamically significant plaque <50% noted in the  CCA. The ECA appears <50% stenosed. There was no evidence of  thrombus, dissection, atherosclerotic plaque or stenosis in the  cervical carotid system.   Left Carotid: Velocities in the left ICA are consistent with a 40-59%  stenosis. Non-hemodynamically significant plaque <50% noted in the  CCA. The ECA appears <50% stenosed. There was no evidence of  thrombus, dissection, atherosclerotic plaque or stenosis in the  cervical carotid system.   Vertebrals:  Bilateral vertebral arteries demonstrate antegrade flow.  Subclavians: Right subclavian artery was stenotic. Normal  flow  hemodynamics were seen in the left subclavian artery.  __________  2D echo 12/2019: 1. Left ventricular ejection fraction, by visual estimation, is 35 to  40%. The left ventricle has mild to moderately decreased function. There  is no left ventricular hypertrophy.   2. Left ventricular diastolic parameters are consistent with Grade I  diastolic dysfunction (impaired relaxation).   3. Mildly dilated left ventricular internal cavity size.   4. The left ventricle demonstrates global hypokinesis.  5. Global right ventricle has normal systolic function.The right  ventricular size is normal. No increase in right ventricular wall  thickness.   6. Left atrial size was mild-moderately dilated.   7. TR signal is inadequate for assessing pulmonary artery systolic  pressure.  __________  LHC 05/2019: Coronary angiography:  Coronary dominance: Right/codominant  Left mainstem:   Large vessel that bifurcates into the LAD and left circumflex, no significant disease noted  Left anterior descending (LAD):   Large vessel that is subtotally occluded 99%, 100% mid vessel occlusion  Left circumflex (LCx):  Large vessel with OM branch 3, occluded mid vessel  Right coronary artery (RCA):  Right or codominant  vessel with PL and PDA, occluded proximally  Grafts LIMA graft to the LAD is patent  Vein graft to the PDA/OM vessels, possible jump graft, patent, large vein graft (Distal OM vessels well visualized and appear to be grafted)  Vein graft to OM previously detailed to be occluded July 2019, not visualized   Left ventriculography: Elevated left ventricular end-diastolic pressure ranging from 24 up to 30 millimeters mercury  no significant aortic valve stenosis  Final Conclusions:   No significant changes compared to cardiac catheterization July 2019 Etiology of significant troponin elevation possibly from small vessel occlusion in the setting of atrial fibrillation with RVR and renal  dysfunction  Recommendations:  Aggressive medical management Tomorrow would start Plavix and Eliquis 5 twice daily given atrial fibrillation and elevated CHADS VASC __________  2D echo 05/2019: 1. The left ventricle has moderate-severely reduced systolic function,  with an ejection fraction of 30-35%. The cavity size was mildly dilated.  Left ventricular diffuse hypokinesis, anterior wall motion best preserved.   2. The right ventricle has normal systolic function. The cavity was  normal. There is no increase in right ventricular wall thickness.Unable to  estimate RVSP   3. Left atrial size was moderately dilated.   4. Challenging image quality.   5. The tricuspid valve is grossly normal.  __________  LHC 06/2018: Ost 2nd Mrg to 2nd Mrg lesion is 100% stenosed. Ost LAD lesion is 99% stenosed. Dist Cx lesion is 75% stenosed. Prox LAD lesion is 100% stenosed. Prox RCA lesion is 100% stenosed. SVG and is very large. The graft exhibits minimal luminal irregularities. The flow is not reversed. There is no competitive flow Origin lesion is 100% stenosed.   1.  Severe three-vessel coronary artery disease with occluded mid LAD, occluded proximal RCA, occluded OM 2 2.  Patent LIMA to LAD 3.  Patent SVG to PDA 4.  Chronically occluded SVG to OM 2 5.  Mild to moderate his left ventricular function with LVEF 42%   Recommendations   1.  Medical therapy 2.  Add Plavix 3.  Uptitrate carvedilol 4.  Start isosorbide mononitrate 30 mg daily __________  2D echo 06/2018: - Procedure narrative: Transthoracic echocardiography. The study    was technically difficult.  - Left ventricle: The cavity size was normal. Wall thickness was    increased in a pattern of mild LVH. Systolic function was mildly    reduced. The estimated ejection fraction was in the range of 45%    to 50%.  - Mitral valve: There was mild regurgitation.    EKG:  EKG is ordered today.  The EKG ordered today  demonstrates sinus rhythm, 82 bpm, occasional PACs and PVCs, nonspecific ST-T changes  Recent Labs: 08/07/2021: ALT 48; Magnesium 2.1; TSH 0.584 08/10/2021: Hemoglobin 13.5; Platelets 149 09/14/2021: BUN 41; Creatinine, Ser  1.86; Potassium 5.0; Sodium 148  Recent Lipid Panel    Component Value Date/Time   CHOL 90 07/11/2020 1101   CHOL 178 05/24/2020 1026   TRIG 51 07/11/2020 1101   HDL 31 (L) 07/11/2020 1101   HDL 30 (L) 05/24/2020 1026   CHOLHDL 2.9 07/11/2020 1101   VLDL 10 07/11/2020 1101   LDLCALC 49 07/11/2020 1101   LDLCALC 130 (H) 05/24/2020 1026    PHYSICAL EXAM:    VS:  BP 120/68 (BP Location: Left Arm, Patient Position: Sitting, Cuff Size: Normal)   Pulse 82   Ht 5' 9"  (1.753 m)   Wt 256 lb (116.1 kg)   SpO2 94%   BMI 37.80 kg/m   BMI: Body mass index is 37.8 kg/m.  Physical Exam Vitals reviewed.  Constitutional:      Appearance: He is well-developed.  HENT:     Head: Normocephalic and atraumatic.  Eyes:     General:        Right eye: No discharge.        Left eye: No discharge.  Neck:     Vascular: JVD present.     Comments: JVD elevated approximately 10 cm Cardiovascular:     Rate and Rhythm: Normal rate and regular rhythm. Occasional Extrasystoles are present.    Pulses:          Posterior tibial pulses are 2+ on the right side and 2+ on the left side.     Heart sounds: Normal heart sounds, S1 normal and S2 normal. Heart sounds not distant. No midsystolic click and no opening snap. No murmur heard.   No friction rub.  Pulmonary:     Effort: Pulmonary effort is normal. No respiratory distress.     Breath sounds: Examination of the right-lower field reveals decreased breath sounds and rales. Examination of the left-lower field reveals decreased breath sounds and rales. Decreased breath sounds and rales present. No wheezing or rhonchi.  Chest:     Chest wall: No tenderness.  Abdominal:     General: There is distension.     Palpations: Abdomen is soft.      Tenderness: There is no abdominal tenderness.  Musculoskeletal:     Cervical back: Normal range of motion.     Right lower leg: Edema present.     Left lower leg: Edema present.     Comments: 1+ bilateral lower extremity edema  Skin:    General: Skin is warm and dry.     Nails: There is no clubbing.  Neurological:     Mental Status: He is alert and oriented to person, place, and time.  Psychiatric:        Speech: Speech normal.        Behavior: Behavior normal.        Thought Content: Thought content normal.        Judgment: Judgment normal.    Wt Readings from Last 3 Encounters:  10/24/21 256 lb (116.1 kg)  09/08/21 248 lb 4 oz (112.6 kg)  08/09/21 230 lb (104.3 kg)     ASSESSMENT & PLAN:   CAD status post CABG without angina: He is without symptoms of chest pain.  Recent LHC in 08/2021 with recommendation for continued medical therapy.  He is on apixaban in place of aspirin to minimize bleeding risk.  He will otherwise continue atorvastatin, carvedilol, ezetimibe, and Imdur.  No plans for repeat cath at this time.  Aggressive risk factor modification.  Acute on chronic HFrEF secondary  to ICM: NYHA class III symptoms.  He is volume overloaded today with a weight that is up 8 pounds when compared to his visit in 09/2021, and up 13 pounds when compared to his visit in 01/2021.  At his last visit, furosemide was decreased from twice daily to daily dosing with stable creatinine noted.  Stop furosemide.  Start torsemide 40 mg daily for 2 days followed by 20 mg daily thereafter.  He will otherwise continue current GDMT including carvedilol and Entresto.  In follow-up, recommend escalation of GDMT with potential titration of carvedilol to minimize ectopy.  Would also consider addition of SGLT2i as renal function and vital signs allow.  Defer addition of MRA with underlying CKD.  CHF education.  PAF: Maintaining sinus rhythm with occasional PACs and PVCs.  He has declined amiodarone  therapy.  I do not think he will tolerate redevelopment of A. fib given his volume status and cardiomyopathy.  Continue carvedilol, with dose escalation deferred at today's visit in an effort to allow for BP room to diurese as outlined above.  Given a CHA2DS2-VASc of at least 5, he will continue indefinite apixaban 2.5 mg twice daily given age and serum creatinine.  No symptoms concerning for bleeding with stable Hgb as outlined above.  HTN: Blood pressure is well controlled in the office today.  Continue current medical therapy as outlined above  HLD: LDL 46 in 07/2020.  He remains on atorvastatin.  CKD stage III: Stable.  Check BMP today given recent initiation of Entresto and again in follow-up next week following outpatient diuresis.   Disposition: F/u with Dr. Fletcher Anon or an APP in 1 week.   Medication Adjustments/Labs and Tests Ordered: Current medicines are reviewed at length with the patient today.  Concerns regarding medicines are outlined above. Medication changes, Labs and Tests ordered today are summarized above and listed in the Patient Instructions accessible in Encounters.   Signed, Christell Faith, PA-C 10/24/2021 9:44 AM     [facility] [address] [place], [place] [address] [phone]

## 2021-10-24 ENCOUNTER — Ambulatory Visit (INDEPENDENT_AMBULATORY_CARE_PROVIDER_SITE_OTHER): Payer: Medicare Other | Admitting: Physician Assistant

## 2021-10-24 ENCOUNTER — Other Ambulatory Visit: Payer: Self-pay

## 2021-10-24 ENCOUNTER — Encounter: Payer: Self-pay | Admitting: Physician Assistant

## 2021-10-24 ENCOUNTER — Other Ambulatory Visit: Payer: Self-pay | Admitting: Physician Assistant

## 2021-10-24 VITALS — BP 120/68 | HR 82 | Ht 69.0 in | Wt 256.0 lb

## 2021-10-24 DIAGNOSIS — I480 Paroxysmal atrial fibrillation: Secondary | ICD-10-CM | POA: Diagnosis not present

## 2021-10-24 DIAGNOSIS — I25810 Atherosclerosis of coronary artery bypass graft(s) without angina pectoris: Secondary | ICD-10-CM | POA: Diagnosis not present

## 2021-10-24 DIAGNOSIS — I255 Ischemic cardiomyopathy: Secondary | ICD-10-CM

## 2021-10-24 DIAGNOSIS — N1832 Chronic kidney disease, stage 3b: Secondary | ICD-10-CM

## 2021-10-24 DIAGNOSIS — I5043 Acute on chronic combined systolic (congestive) and diastolic (congestive) heart failure: Secondary | ICD-10-CM

## 2021-10-24 DIAGNOSIS — E785 Hyperlipidemia, unspecified: Secondary | ICD-10-CM

## 2021-10-24 DIAGNOSIS — I10 Essential (primary) hypertension: Secondary | ICD-10-CM

## 2021-10-24 MED ORDER — TORSEMIDE 20 MG PO TABS: 20.0000 mg | ORAL_TABLET | ORAL | 0 refills | Status: AC

## 2021-10-24 NOTE — Patient Instructions (Signed)
Medication Instructions:  Your physician has recommended you make the following change in your medication:   STOP Furosemide START Torsemide 20 mg and take 2 tablets (40 mg) once a day for 2 days THEN change to 1 tablet (20 mg) once a day  *If you need a refill on your cardiac medications before your next appointment, please call your pharmacy*   Lab Work: BMET today BMP in one week here in the office.   If you have labs (blood work) drawn today and your tests are completely normal, you will receive your results only by: [place] (if you have MyChart) OR A paper copy in the mail If you have any lab test that is abnormal or we need to change your treatment, we will call you to review the results.   Testing/Procedures: None   Follow-Up: At [facility], you and your health needs are our priority.  As part of our continuing mission to provide you with exceptional heart care, we have created designated Provider Care Teams.  These Care Teams include your primary Cardiologist (physician) and Advanced Practice Providers (APPs -  Physician Assistants and Nurse Practitioners) who all work together to provide you with the care you need, when you need it.   Your next appointment:   1 week(s)  The format for your next appointment:   In Person  Provider:   [name], MD or Christell Faith, PA-C

## 2021-10-25 LAB — BASIC METABOLIC PANEL
BUN/Creatinine Ratio: 24 (ref 10–24)
BUN: 50 mg/dL — ABNORMAL HIGH (ref 8–27)
CO2: 20 mmol/L (ref 20–29)
Calcium: 9.1 mg/dL (ref 8.6–10.2)
Chloride: 103 mmol/L (ref 96–106)
Creatinine, Ser: 2.1 mg/dL — ABNORMAL HIGH (ref 0.76–1.27)
Glucose: 121 mg/dL — ABNORMAL HIGH (ref 70–99)
Potassium: 4.8 mmol/L (ref 3.5–5.2)
Sodium: 141 mmol/L (ref 134–144)
eGFR: 31 mL/min/{1.73_m2} — ABNORMAL LOW (ref >59)

## 2021-10-30 ENCOUNTER — Telehealth: Payer: Self-pay | Admitting: Cardiovascular Disease

## 2021-10-30 NOTE — Telephone Encounter (Signed)
Rich and thompson calling RE Death certificate that is ready for electronic signature

## 2021-10-30 NOTE — Telephone Encounter (Signed)
Called and spoke with Velva Harman at [facility]. Patient passed away at home. She provided me with the patients son information. She stated that the sheriff was called to his home.   Called patients son "Jeff Henderson and he reports talking to him on Tuesday evening and his father was not feeling well. He reported that patient had fluid retention and was going to restroom frequently and did not want feel up to getting together with his family. Son stated that he understood and then he tried calling on Wednesday and then again on Thursday with no answer. He then called for well check and they found him deceased in his recliner with blanket covering him. He stated they did not notice any distress and that it appeared he went to sleep and just never woke up. Provided emotional support, condolences, and he had no further questions at this time.

## 2021-10-30 NOTE — Telephone Encounter (Signed)
Spoke with patient's son this evening and offered condolences.

## 2021-10-31 ENCOUNTER — Ambulatory Visit: Payer: Medicare Other | Admitting: Cardiovascular Disease

## 2021-10-31 NOTE — Telephone Encounter (Signed)
Noted  

## 2021-10-31 NOTE — Telephone Encounter (Signed)
I certified the death certificate online

## 2021-10-31 NOTE — Telephone Encounter (Signed)
Jeff Henderson with [facility] calling again to check status of death certificate signature Patient is being cremated - needs ASAP

## 2021-11-02 DEATH — deceased

## 2021-12-19 ENCOUNTER — Other Ambulatory Visit: Payer: Medicare Other

## 2021-12-19 ENCOUNTER — Inpatient Hospital Stay: Payer: Self-pay | Admitting: Nurse Practitioner

## 2021-12-19 ENCOUNTER — Ambulatory Visit: Payer: Medicare Other | Admitting: Oncology

## 2021-12-19 ENCOUNTER — Inpatient Hospital Stay: Payer: Self-pay | Attending: Nurse Practitioner
